# Patient Record
Sex: Male | Born: 1935 | Race: White | Hispanic: No | Marital: Married | Smoking: Never smoker
Health system: Southern US, Community
[De-identification: ages and names within clinical notes are randomized; demographics above are authoritative.]

## PROBLEM LIST (undated history)

## (undated) DIAGNOSIS — I2510 Atherosclerotic heart disease of native coronary artery without angina pectoris: Secondary | ICD-10-CM

## (undated) DIAGNOSIS — I10 Essential (primary) hypertension: Secondary | ICD-10-CM

## (undated) DIAGNOSIS — D649 Anemia, unspecified: Secondary | ICD-10-CM

## (undated) DIAGNOSIS — I5022 Chronic systolic (congestive) heart failure: Secondary | ICD-10-CM

## (undated) DIAGNOSIS — N184 Chronic kidney disease, stage 4 (severe): Secondary | ICD-10-CM

## (undated) DIAGNOSIS — M1990 Unspecified osteoarthritis, unspecified site: Secondary | ICD-10-CM

## (undated) DIAGNOSIS — G4730 Sleep apnea, unspecified: Secondary | ICD-10-CM

## (undated) DIAGNOSIS — J984 Other disorders of lung: Secondary | ICD-10-CM

## (undated) DIAGNOSIS — I4821 Permanent atrial fibrillation: Secondary | ICD-10-CM

## (undated) DIAGNOSIS — I350 Nonrheumatic aortic (valve) stenosis: Secondary | ICD-10-CM

## (undated) DIAGNOSIS — E785 Hyperlipidemia, unspecified: Secondary | ICD-10-CM

## (undated) DIAGNOSIS — J90 Pleural effusion, not elsewhere classified: Secondary | ICD-10-CM

## (undated) DIAGNOSIS — Z9289 Personal history of other medical treatment: Secondary | ICD-10-CM

## (undated) DIAGNOSIS — I340 Nonrheumatic mitral (valve) insufficiency: Secondary | ICD-10-CM

## (undated) DIAGNOSIS — R040 Epistaxis: Secondary | ICD-10-CM

## (undated) HISTORY — DX: Personal history of other medical treatment: Z92.89

## (undated) HISTORY — DX: Sleep apnea, unspecified: G47.30

## (undated) HISTORY — DX: Chronic systolic (congestive) heart failure: I50.22

## (undated) HISTORY — DX: Essential (primary) hypertension: I10

## (undated) HISTORY — DX: Permanent atrial fibrillation: I48.21

## (undated) HISTORY — PX: OTHER SURGICAL HISTORY: SHX169

## (undated) HISTORY — PX: CATARACT EXTRACTION: SUR2

## (undated) HISTORY — DX: Anemia, unspecified: D64.9

## (undated) HISTORY — PX: BACK SURGERY: SHX140

## (undated) HISTORY — DX: Pleural effusion, not elsewhere classified: J90

## (undated) HISTORY — DX: Other disorders of lung: J98.4

## (undated) HISTORY — DX: Hyperlipidemia, unspecified: E78.5

## (undated) HISTORY — PX: HERNIA REPAIR: SHX51

## (undated) HISTORY — DX: Atherosclerotic heart disease of native coronary artery without angina pectoris: I25.10

## (undated) HISTORY — DX: Nonrheumatic aortic (valve) stenosis: I35.0

## (undated) HISTORY — DX: Chronic kidney disease, stage 4 (severe): N18.4

---

## 1997-05-22 HISTORY — PX: CORONARY ARTERY BYPASS GRAFT: SHX141

## 1998-02-22 ENCOUNTER — Inpatient Hospital Stay (HOSPITAL_COMMUNITY): Admission: AD | Admit: 1998-02-22 | Discharge: 1998-03-01 | Payer: Self-pay | Admitting: Cardiovascular Disease

## 1998-02-22 ENCOUNTER — Encounter: Payer: Self-pay | Admitting: Cardiovascular Disease

## 1998-02-25 ENCOUNTER — Encounter: Payer: Self-pay | Admitting: Surgery

## 1998-02-26 ENCOUNTER — Encounter: Payer: Self-pay | Admitting: Surgery

## 2002-03-26 ENCOUNTER — Ambulatory Visit (HOSPITAL_COMMUNITY): Admission: RE | Admit: 2002-03-26 | Discharge: 2002-03-26 | Payer: Self-pay | Admitting: *Deleted

## 2002-10-15 ENCOUNTER — Ambulatory Visit (HOSPITAL_COMMUNITY): Admission: RE | Admit: 2002-10-15 | Discharge: 2002-10-15 | Payer: Self-pay | Admitting: Gastroenterology

## 2002-10-15 ENCOUNTER — Encounter (INDEPENDENT_AMBULATORY_CARE_PROVIDER_SITE_OTHER): Payer: Self-pay

## 2004-04-04 ENCOUNTER — Encounter: Payer: Self-pay | Admitting: Cardiovascular Disease

## 2006-09-07 ENCOUNTER — Ambulatory Visit (HOSPITAL_COMMUNITY): Admission: RE | Admit: 2006-09-07 | Discharge: 2006-09-07 | Payer: Self-pay | Admitting: Family Medicine

## 2007-04-04 ENCOUNTER — Encounter: Payer: Self-pay | Admitting: Cardiovascular Disease

## 2007-05-14 ENCOUNTER — Ambulatory Visit (HOSPITAL_COMMUNITY): Admission: RE | Admit: 2007-05-14 | Discharge: 2007-05-14 | Payer: Self-pay | Admitting: Cardiovascular Disease

## 2007-08-14 ENCOUNTER — Ambulatory Visit (HOSPITAL_COMMUNITY): Admission: RE | Admit: 2007-08-14 | Discharge: 2007-08-14 | Payer: Self-pay | Admitting: Cardiovascular Disease

## 2008-03-04 ENCOUNTER — Encounter: Admission: RE | Admit: 2008-03-04 | Discharge: 2008-03-04 | Payer: Self-pay | Admitting: Family Medicine

## 2008-05-22 HISTORY — PX: CORONARY ANGIOPLASTY WITH STENT PLACEMENT: SHX49

## 2009-01-20 ENCOUNTER — Ambulatory Visit (HOSPITAL_COMMUNITY): Admission: RE | Admit: 2009-01-20 | Discharge: 2009-01-20 | Payer: Self-pay | Admitting: Cardiovascular Disease

## 2009-01-20 ENCOUNTER — Encounter: Payer: Self-pay | Admitting: Pulmonary Disease

## 2009-01-20 DIAGNOSIS — Z9289 Personal history of other medical treatment: Secondary | ICD-10-CM

## 2009-01-20 HISTORY — DX: Personal history of other medical treatment: Z92.89

## 2009-02-03 ENCOUNTER — Inpatient Hospital Stay (HOSPITAL_COMMUNITY): Admission: EM | Admit: 2009-02-03 | Discharge: 2009-02-05 | Payer: Self-pay | Admitting: Emergency Medicine

## 2009-02-03 ENCOUNTER — Encounter: Payer: Self-pay | Admitting: Pulmonary Disease

## 2009-02-08 DIAGNOSIS — Z8679 Personal history of other diseases of the circulatory system: Secondary | ICD-10-CM

## 2009-02-08 DIAGNOSIS — G4733 Obstructive sleep apnea (adult) (pediatric): Secondary | ICD-10-CM

## 2009-02-08 DIAGNOSIS — J984 Other disorders of lung: Secondary | ICD-10-CM

## 2009-02-08 DIAGNOSIS — E119 Type 2 diabetes mellitus without complications: Secondary | ICD-10-CM

## 2009-02-08 DIAGNOSIS — N259 Disorder resulting from impaired renal tubular function, unspecified: Secondary | ICD-10-CM | POA: Insufficient documentation

## 2009-02-08 DIAGNOSIS — I10 Essential (primary) hypertension: Secondary | ICD-10-CM | POA: Insufficient documentation

## 2009-02-08 DIAGNOSIS — I2510 Atherosclerotic heart disease of native coronary artery without angina pectoris: Secondary | ICD-10-CM | POA: Insufficient documentation

## 2009-02-08 DIAGNOSIS — I5023 Acute on chronic systolic (congestive) heart failure: Secondary | ICD-10-CM

## 2009-02-08 DIAGNOSIS — E785 Hyperlipidemia, unspecified: Secondary | ICD-10-CM

## 2009-02-09 ENCOUNTER — Ambulatory Visit: Payer: Self-pay | Admitting: Pulmonary Disease

## 2009-02-09 DIAGNOSIS — R0609 Other forms of dyspnea: Secondary | ICD-10-CM | POA: Insufficient documentation

## 2009-03-01 ENCOUNTER — Ambulatory Visit (HOSPITAL_BASED_OUTPATIENT_CLINIC_OR_DEPARTMENT_OTHER): Admission: RE | Admit: 2009-03-01 | Discharge: 2009-03-01 | Payer: Self-pay | Admitting: Pulmonary Disease

## 2009-03-01 ENCOUNTER — Encounter: Payer: Self-pay | Admitting: Pulmonary Disease

## 2009-03-04 ENCOUNTER — Ambulatory Visit: Payer: Self-pay | Admitting: Pulmonary Disease

## 2009-03-04 ENCOUNTER — Encounter: Payer: Self-pay | Admitting: Pulmonary Disease

## 2009-03-16 ENCOUNTER — Ambulatory Visit: Payer: Self-pay | Admitting: Pulmonary Disease

## 2009-03-17 ENCOUNTER — Telehealth (INDEPENDENT_AMBULATORY_CARE_PROVIDER_SITE_OTHER): Payer: Self-pay | Admitting: *Deleted

## 2009-03-22 ENCOUNTER — Ambulatory Visit: Payer: Self-pay | Admitting: Pulmonary Disease

## 2009-04-19 ENCOUNTER — Ambulatory Visit: Payer: Self-pay | Admitting: Pulmonary Disease

## 2009-05-18 ENCOUNTER — Encounter (INDEPENDENT_AMBULATORY_CARE_PROVIDER_SITE_OTHER): Payer: Self-pay | Admitting: Internal Medicine

## 2009-05-18 ENCOUNTER — Inpatient Hospital Stay (HOSPITAL_COMMUNITY): Admission: EM | Admit: 2009-05-18 | Discharge: 2009-05-21 | Payer: Self-pay | Admitting: Emergency Medicine

## 2009-05-29 ENCOUNTER — Encounter: Payer: Self-pay | Admitting: Pulmonary Disease

## 2009-06-10 ENCOUNTER — Encounter: Payer: Self-pay | Admitting: Pulmonary Disease

## 2009-08-04 ENCOUNTER — Encounter: Payer: Self-pay | Admitting: Pulmonary Disease

## 2009-10-11 ENCOUNTER — Ambulatory Visit: Payer: Self-pay | Admitting: Pulmonary Disease

## 2009-10-12 ENCOUNTER — Encounter: Payer: Self-pay | Admitting: Pulmonary Disease

## 2009-11-26 ENCOUNTER — Ambulatory Visit: Payer: Medicare Other | Admitting: Ophthalmology

## 2009-12-01 ENCOUNTER — Ambulatory Visit: Payer: Medicare Other | Admitting: Ophthalmology

## 2009-12-28 ENCOUNTER — Ambulatory Visit: Payer: Self-pay | Admitting: Cardiovascular Disease

## 2010-02-02 ENCOUNTER — Ambulatory Visit: Payer: Self-pay | Admitting: Cardiovascular Disease

## 2010-02-15 ENCOUNTER — Ambulatory Visit: Payer: Self-pay | Admitting: Cardiovascular Disease

## 2010-03-15 ENCOUNTER — Ambulatory Visit: Payer: Self-pay | Admitting: Cardiovascular Disease

## 2010-04-12 ENCOUNTER — Ambulatory Visit: Payer: Self-pay | Admitting: Cardiology

## 2010-05-04 ENCOUNTER — Encounter: Payer: Self-pay | Admitting: Pulmonary Disease

## 2010-05-04 ENCOUNTER — Ambulatory Visit: Payer: Self-pay | Admitting: Cardiovascular Disease

## 2010-06-02 ENCOUNTER — Telehealth: Payer: Self-pay | Admitting: Pulmonary Disease

## 2010-06-02 ENCOUNTER — Ambulatory Visit: Payer: Self-pay | Admitting: Cardiology

## 2010-06-16 ENCOUNTER — Ambulatory Visit
Admission: RE | Admit: 2010-06-16 | Discharge: 2010-06-16 | Payer: Self-pay | Source: Home / Self Care | Attending: Pulmonary Disease | Admitting: Pulmonary Disease

## 2010-06-23 NOTE — Assessment & Plan Note (Signed)
Summary: rov for osa   Copy to:  Dr. Elease Hashimoto Primary Provider/Referring Provider:  Windle Guard  CC:  Pt is here for a 6 month f/u appt.  Pt states he is wearing his cpap machine every night.  Approx 6 to 8 horus per night.  Pt states cpap mcahine is "noisy" and causes bed partner to leave the room. Marland Kitchen  History of Present Illness: the patient comes in today for followup of his known obstructive sleep apnea. He is wearing CPAP compliantly, and is having no issues with his mask fit or CPAP pressure. He does occasionally break a seal while sleeping, and this sometimes leads to a flapping noise which can awaken his wife. Overall though, he is pleased with his improved sleep and also daytime alertness. It turns out that he is still on his auto titration setting, and he has never been put on his optimal pressure of 14 cm.   Current Medications (verified): 1)  Amlodipine Besylate 5 Mg Tabs (Amlodipine Besylate) .Marland Kitchen.. 1 Once Daily 2)  Plavix 75 Mg Tabs (Clopidogrel Bisulfate) .Marland Kitchen.. 1 Once Daily 3)  Nitrostat 0.4 Mg Subl (Nitroglycerin) .... Sublingually As Needed For Chest Pain 4)  Klor-Con 10 10 Meq Cr-Tabs (Potassium Chloride) .Marland Kitchen.. 1 Once Daily 5)  Crestor 20 Mg Tabs (Rosuvastatin Calcium) .... 1/2 By Mouth Daily 6)  Tramadol Hcl 50 Mg  Tabs (Tramadol Hcl) .... Take By Mouth As Needed 7)  Aspirin 81 Mg Tbec (Aspirin) .Marland Kitchen.. 1 Once Daily 8)  Vitamin D 5000 Units .Marland Kitchen.. 1 Once Daily 9)  Coumadin 5 Mg Tabs (Warfarin Sodium) .... Take As Directed 10)  Doxazosin Mesylate 4 Mg Tabs (Doxazosin Mesylate) .Marland Kitchen.. 1 At Bedtime 11)  Furosemide 40 Mg Tabs (Furosemide) .... Take 2 Tabs By Mouth Daily 12)  Metoprolol Tartrate 50 Mg Tabs (Metoprolol Tartrate) .Marland Kitchen.. 1 Two Times A Day 13)  Fish Oil 1000 Mg Caps (Omega-3 Fatty Acids) .Marland Kitchen.. 1 By Mouth Daily 14)  Plaquenil 200 Mg Tabs (Hydroxychloroquine Sulfate) .... 2 Once Daily 15)  Pepcid Ac 10 Mg Tabs (Famotidine) .... As Needed  Allergies (verified): No Known Drug  Allergies  Review of Systems  The patient denies shortness of breath with activity, shortness of breath at rest, productive cough, non-productive cough, coughing up blood, chest pain, irregular heartbeats, acid heartburn, indigestion, loss of appetite, weight change, abdominal pain, difficulty swallowing, sore throat, tooth/dental problems, headaches, nasal congestion/difficulty breathing through nose, sneezing, itching, ear ache, anxiety, depression, hand/feet swelling, joint stiffness or pain, rash, change in color of mucus, and fever.    Vital Signs:  Patient profile:   77 year old male Height:      70 inches Weight:      232.38 pounds BMI:     33.46 O2 Sat:      98 % on Room air Temp:     98.1 degrees F oral Pulse rate:   56 / minute BP sitting:   122 / 64  (left arm) Cuff size:   regular  Vitals Entered By: Arman Filter LPN (Oct 11, 2009 1:38 PM)  O2 Flow:  Room air  Physical Exam  General:  ow male in nad Nose:  no skin breakdown or pressure necrosis from cpap mask Extremities:  no edema or cyanosis Neurologic:  alert, not sleepy, moves all 4.   Impression & Recommendations:  Problem # 1:  OBSTRUCTIVE SLEEP APNEA (ICD-327.23) the pt is doing very well with cpap, and feels it has greatly improved his sleep and daytime  alertness. He occasionally has a mask leak that can create noise, but this does not happen very often. If it continues to be an issue, we can work on a different fitting mask. At this point, I would like to set his machine on his optimal pressure of 14 cm, and see how things go. I have asked him to call if he continues to have issues, and that he needs to work aggressively on weight loss.  Medications Added to Medication List This Visit: 1)  Tramadol Hcl 50 Mg Tabs (Tramadol hcl) .... Take by mouth as needed  Other Orders: Est. Patient Level III (04540) DME Referral (DME)  Patient Instructions: 1)  will get cpap machine set on 14cm where it is supposed  to be.  Let me know if you like auto mode better. 2)  work on weight loss. 3)  followup with me in one year or sooner if needed.   Immunization History:  Influenza Immunization History:    Influenza:  historical (05/22/2008)  Pneumovax Immunization History:    Pneumovax:  historical (05/23/2007)

## 2010-06-23 NOTE — Letter (Signed)
Summary: [facility]   Imported By: Maryln Gottron 08/16/2009 12:20:28  _____________________________________________________________________  External Attachment:    Type:   Image     Comment:   External Document

## 2010-06-23 NOTE — Progress Notes (Signed)
Summary: cpap re-cert  Phone Note Call from Patient Call back at Home Phone [phone]   Caller: Patient Call For: [facility] Summary of Call: pt says [facility] requests re-cert for cpap. (per medicare) Initial call taken by: Tivis Ringer, CNA,  June 02, 2010 10:16 AM  Follow-up for Phone Call        I spoke to [place] at [place] and she states per medicare guidelines pt has to have a f/u after receiving cpap within 30-90days of getting the machine.  Pt has to have an appt before Feb 2nd. Neysa Bonito states they have a download that they are faxin gover today for [facility] to look-at for this pt.  Pt was given cpap on 03-23-10. Pt set to see [facility] on 06-16-10 at 10:15. Carron Curie CMA  June 02, 2010 11:48 AM

## 2010-06-23 NOTE — Letter (Signed)
Summary: [facility] for CPAP Supplies/[facility]CS   [facility]N for CPAP Supplies/[facility]   Imported By: Sherian Rein 06/16/2009 15:11:04  _____________________________________________________________________  External Attachment:    Type:   Image     Comment:   External Document

## 2010-06-23 NOTE — Letter (Signed)
Summary: CMN for CPAP Supplies/[facility]  CMN for CPAP Supplies/[facility]   Imported By: Sherian Rein 10/22/2009 07:59:44  _____________________________________________________________________  External Attachment:    Type:   Image     Comment:   External Document

## 2010-06-23 NOTE — Letter (Signed)
Summary: [facility]N for CPAP Supplies/[facility]CS  [facility] for CPAP Supplies/[facility]   Imported By: Sherian Rein 06/17/2009 07:49:16  _____________________________________________________________________  External Attachment:    Type:   Image     Comment:   External Document

## 2010-06-23 NOTE — Assessment & Plan Note (Signed)
Summary: rov for osa   Copy to:  Dr. Elease Hashimoto Primary Provider/Referring Provider:  Windle Guard  CC:  FOLLOW UP. pt states he currently wears his cpap eveyrnight x 8 hrs a night. Pt states he is having no problems with machine/mask.  History of Present Illness: the pt comes in today for f/u of his known osa.  He has been wearing cpap compliantly, and has seen a definite difference in his sleep and daytime alertness.  We have gotten him switched over to a set pressure mode at 14cm, and he is tolerating this.  His mask is fitting well, and no more "flapping".  He reports no significant daytime sleepiness.  Current Medications (verified): 1)  Amlodipine Besylate 5 Mg Tabs (Amlodipine Besylate) .Marland Kitchen.. 1 Once Daily 2)  Plavix 75 Mg Tabs (Clopidogrel Bisulfate) .Marland Kitchen.. 1 Once Daily 3)  Nitrostat 0.4 Mg Subl (Nitroglycerin) .... Sublingually As Needed For Chest Pain 4)  Klor-Con 10 10 Meq Cr-Tabs (Potassium Chloride) .Marland Kitchen.. 1 Once Daily 5)  Crestor 20 Mg Tabs (Rosuvastatin Calcium) .... 1/2 By Mouth Daily 6)  Tramadol Hcl 50 Mg  Tabs (Tramadol Hcl) .... Take By Mouth As Needed 7)  Aspirin 81 Mg Tbec (Aspirin) .Marland Kitchen.. 1 Once Daily 8)  Vitamin D 5000 Units .Marland Kitchen.. 1 Once Daily 9)  Coumadin 5 Mg Tabs (Warfarin Sodium) .... Take As Directed 10)  Doxazosin Mesylate 4 Mg Tabs (Doxazosin Mesylate) .Marland Kitchen.. 1 At Bedtime 11)  Furosemide 40 Mg Tabs (Furosemide) .... Take 2 Tabs By Mouth Daily 12)  Metoprolol Tartrate 50 Mg Tabs (Metoprolol Tartrate) .Marland Kitchen.. 1 Two Times A Day 13)  Fish Oil 1000 Mg Caps (Omega-3 Fatty Acids) .Marland Kitchen.. 1 By Mouth Daily 14)  Plaquenil 200 Mg Tabs (Hydroxychloroquine Sulfate) .... 2 Once Daily 15)  Pepcid Ac 10 Mg Tabs (Famotidine) .... As Needed 16)  Iron 240 (27 Fe) Mg Tabs (Ferrous Gluconate) .... Once Daily  Allergies (verified): No Known Drug Allergies  Review of Systems  The patient denies shortness of breath with activity, shortness of breath at rest, productive cough, non-productive  cough, coughing up blood, chest pain, irregular heartbeats, acid heartburn, indigestion, loss of appetite, weight change, abdominal pain, difficulty swallowing, sore throat, tooth/dental problems, headaches, nasal congestion/difficulty breathing through nose, sneezing, itching, ear ache, anxiety, depression, hand/feet swelling, joint stiffness or pain, rash, change in color of mucus, and fever.    Vital Signs:  Patient profile:   77 year old male Height:      70 inches Weight:      245 pounds BMI:     35.28 O2 Sat:      98 % on Room air Temp:     98.3 degrees F oral Pulse rate:   64 / minute BP sitting:   138 / 80  (left arm) Cuff size:   large  Vitals Entered By: Carver Fila (June 16, 2010 3:27 PM)  O2 Flow:  Room air CC: FOLLOW UP. pt states he currently wears his cpap eveyrnight x 8 hrs a night. Pt states he is having no problems with machine/mask Comments meds and allergies updated Phone number updated Carver Fila  June 16, 2010 3:28 PM    Physical Exam  General:  obese male in nad Nose:  no skin breakdown or pressure necrosis from cpap mask Extremities:  mild edema LE, no cyanosis  Neurologic:  alert and oriented, moves all 4. does not appear sleepy.   Impression & Recommendations:  Problem # 1:  OBSTRUCTIVE SLEEP APNEA (ICD-327.23) the  pt is doing well with cpap, and feels he is sleeping well with satisfactory alertness during the day.  He is having no issues with mask fit or pressure.  I have asked him to continue with his cpap, and have encouraged him to work on weight loss.    Medications Added to Medication List This Visit: 1)  Iron 240 (27 Fe) Mg Tabs (Ferrous gluconate) .... Once daily  Other Orders: Est. Patient Level III ([address])  Patient Instructions: 1)  stay on cpap at current setting 2)  work on weight loss 3)  cancel upcoming may apptm and reschedule for one year from today.  I am happy to  see you sooner if having issues.   Immunization  History:  Influenza Immunization History:    Influenza:  historical (02/19/2010)  Pneumovax Immunization History:    Pneumovax:  historical (10/05/2008)

## 2010-06-23 NOTE — Miscellaneous (Signed)
Summary: optimal pressure 14cm  Clinical Lists Changes  Orders: Added new Referral order of DME Referral (DME) - Signed auto shows great compliance, optimal pressure 14cm

## 2010-06-29 ENCOUNTER — Encounter (INDEPENDENT_AMBULATORY_CARE_PROVIDER_SITE_OTHER): Payer: Medicare Other

## 2010-06-29 DIAGNOSIS — I4891 Unspecified atrial fibrillation: Secondary | ICD-10-CM

## 2010-06-29 DIAGNOSIS — Z7901 Long term (current) use of anticoagulants: Secondary | ICD-10-CM

## 2010-06-29 NOTE — Letter (Signed)
Summary: Alvia Grove MD/[facility]  Alvia Grove MD/[facility]   Imported By: Lester [place] 06/23/2010 09:55:17  _____________________________________________________________________  External Attachment:    Type:   Image     Comment:   External Document

## 2010-07-27 ENCOUNTER — Other Ambulatory Visit (INDEPENDENT_AMBULATORY_CARE_PROVIDER_SITE_OTHER): Payer: Medicare Other

## 2010-07-27 DIAGNOSIS — Z7901 Long term (current) use of anticoagulants: Secondary | ICD-10-CM

## 2010-07-27 DIAGNOSIS — I4891 Unspecified atrial fibrillation: Secondary | ICD-10-CM

## 2010-08-22 LAB — CBC
HCT: 27.7 % — ABNORMAL LOW (ref 39.0–52.0)
HCT: 28.1 % — ABNORMAL LOW (ref 39.0–52.0)
HCT: 29.9 % — ABNORMAL LOW (ref 39.0–52.0)
HCT: 31.8 % — ABNORMAL LOW (ref 39.0–52.0)
Hemoglobin: 10 g/dL — ABNORMAL LOW (ref 13.0–17.0)
Hemoglobin: 10.8 g/dL — ABNORMAL LOW (ref 13.0–17.0)
Hemoglobin: 9.2 g/dL — ABNORMAL LOW (ref 13.0–17.0)
MCHC: 33.5 g/dL (ref 30.0–36.0)
MCV: 81.8 fL (ref 78.0–100.0)
MCV: 82.4 fL (ref 78.0–100.0)
MCV: 82.4 fL (ref 78.0–100.0)
Platelets: 115 10*3/uL — ABNORMAL LOW (ref 150–400)
Platelets: 143 10*3/uL — ABNORMAL LOW (ref 150–400)
RBC: 3.11 MIL/uL — ABNORMAL LOW (ref 4.22–5.81)
RBC: 3.88 MIL/uL — ABNORMAL LOW (ref 4.22–5.81)
RDW: 15.4 % (ref 11.5–15.5)
RDW: 15.7 % — ABNORMAL HIGH (ref 11.5–15.5)
RDW: 16.1 % — ABNORMAL HIGH (ref 11.5–15.5)
WBC: 17.9 10*3/uL — ABNORMAL HIGH (ref 4.0–10.5)
WBC: 21 10*3/uL — ABNORMAL HIGH (ref 4.0–10.5)
WBC: 6.6 10*3/uL (ref 4.0–10.5)
WBC: 7.4 10*3/uL (ref 4.0–10.5)

## 2010-08-22 LAB — BASIC METABOLIC PANEL
BUN: 26 mg/dL — ABNORMAL HIGH (ref 6–23)
BUN: 28 mg/dL — ABNORMAL HIGH (ref 6–23)
BUN: 33 mg/dL — ABNORMAL HIGH (ref 6–23)
CO2: 28 mEq/L (ref 19–32)
Chloride: 103 mEq/L (ref 96–112)
Chloride: 103 mEq/L (ref 96–112)
Chloride: 103 mEq/L (ref 96–112)
Chloride: 105 mEq/L (ref 96–112)
Creatinine, Ser: 2.25 mg/dL — ABNORMAL HIGH (ref 0.4–1.5)
Creatinine, Ser: 2.35 mg/dL — ABNORMAL HIGH (ref 0.4–1.5)
GFR calc Af Amer: 34 mL/min — ABNORMAL LOW (ref >60)
GFR calc non Af Amer: 30 mL/min — ABNORMAL LOW (ref >60)
Glucose, Bld: 96 mg/dL (ref 70–99)
Glucose, Bld: 98 mg/dL (ref 70–99)
Potassium: 3.3 mEq/L — ABNORMAL LOW (ref 3.5–5.1)
Potassium: 3.3 mEq/L — ABNORMAL LOW (ref 3.5–5.1)
Potassium: 3.4 mEq/L — ABNORMAL LOW (ref 3.5–5.1)
Potassium: 3.6 mEq/L (ref 3.5–5.1)
Potassium: 3.9 mEq/L (ref 3.5–5.1)
Sodium: 139 mEq/L (ref 135–145)
Sodium: 141 mEq/L (ref 135–145)
Sodium: 142 mEq/L (ref 135–145)

## 2010-08-22 LAB — CULTURE, BLOOD (ROUTINE X 2)

## 2010-08-22 LAB — POCT CARDIAC MARKERS
Myoglobin, poc: 365 ng/mL (ref 12–200)
Troponin i, poc: 0.18 ng/mL — ABNORMAL HIGH (ref 0.00–0.09)
Troponin i, poc: <0.05 ng/mL (ref 0.00–0.09)

## 2010-08-22 LAB — CARDIAC PANEL(CRET KIN+CKTOT+MB+TROPI)
Relative Index: 3.2 — ABNORMAL HIGH (ref 0.0–2.5)
Relative Index: 5.7 — ABNORMAL HIGH (ref 0.0–2.5)
Total CK: 132 U/L (ref 7–232)

## 2010-08-22 LAB — LIPID PANEL
HDL: 43 mg/dL (ref >39)
Total CHOL/HDL Ratio: 2.2 RATIO

## 2010-08-22 LAB — DIFFERENTIAL
Basophils Relative: 0 % (ref 0–1)
Eosinophils Relative: 0 % (ref 0–5)
Lymphs Abs: 0.2 10*3/uL — ABNORMAL LOW (ref 0.7–4.0)
Monocytes Relative: 4 % (ref 3–12)
Neutro Abs: 17 10*3/uL — ABNORMAL HIGH (ref 1.7–7.7)

## 2010-08-22 LAB — BRAIN NATRIURETIC PEPTIDE
Pro B Natriuretic peptide (BNP): 1848 pg/mL — ABNORMAL HIGH (ref 0.0–100.0)
Pro B Natriuretic peptide (BNP): 810 pg/mL — ABNORMAL HIGH (ref 0.0–100.0)

## 2010-08-22 LAB — GLUCOSE, CAPILLARY

## 2010-08-22 LAB — URINALYSIS, ROUTINE W REFLEX MICROSCOPIC
Bilirubin Urine: NEGATIVE
Ketones, ur: NEGATIVE mg/dL
Nitrite: NEGATIVE
pH: 7 (ref 5.0–8.0)

## 2010-08-22 LAB — APTT: aPTT: 32 seconds (ref 24–37)

## 2010-08-22 LAB — PROTIME-INR
INR: 1.57 — ABNORMAL HIGH (ref 0.00–1.49)
Prothrombin Time: 18.8 seconds — ABNORMAL HIGH (ref 11.6–15.2)

## 2010-08-22 LAB — TROPONIN I

## 2010-08-24 ENCOUNTER — Ambulatory Visit (INDEPENDENT_AMBULATORY_CARE_PROVIDER_SITE_OTHER): Payer: Medicare Other | Admitting: *Deleted

## 2010-08-24 DIAGNOSIS — Z7901 Long term (current) use of anticoagulants: Secondary | ICD-10-CM

## 2010-08-24 DIAGNOSIS — I4891 Unspecified atrial fibrillation: Secondary | ICD-10-CM

## 2010-08-24 LAB — POCT INR: INR: 2.9

## 2010-08-25 ENCOUNTER — Encounter: Payer: Medicare Other | Admitting: *Deleted

## 2010-08-26 LAB — CARDIAC PANEL(CRET KIN+CKTOT+MB+TROPI)
CK, MB: 49.8 ng/mL — ABNORMAL HIGH (ref 0.3–4.0)
Relative Index: 12.6 — ABNORMAL HIGH (ref 0.0–2.5)
Total CK: 396 U/L — ABNORMAL HIGH (ref 7–232)
Troponin I: 10.6 ng/mL (ref 0.00–0.06)
Troponin I: 18.69 ng/mL (ref 0.00–0.06)

## 2010-08-26 LAB — POCT I-STAT, CHEM 8
Calcium, Ion: 1.1 mmol/L — ABNORMAL LOW (ref 1.12–1.32)
Glucose, Bld: 113 mg/dL — ABNORMAL HIGH (ref 70–99)
HCT: 36 % — ABNORMAL LOW (ref 39.0–52.0)
Hemoglobin: 12.2 g/dL — ABNORMAL LOW (ref 13.0–17.0)

## 2010-08-26 LAB — POCT I-STAT 3, ART BLOOD GAS (G3+)
TCO2: 30 mmol/L (ref 0–100)
pCO2 arterial: 41.7 mmHg (ref 35.0–45.0)
pH, Arterial: 7.444 (ref 7.350–7.450)

## 2010-08-26 LAB — COMPREHENSIVE METABOLIC PANEL
Alkaline Phosphatase: 54 U/L (ref 39–117)
BUN: 21 mg/dL (ref 6–23)
CO2: 28 mEq/L (ref 19–32)
GFR calc non Af Amer: 38 mL/min — ABNORMAL LOW (ref >60)
Glucose, Bld: 121 mg/dL — ABNORMAL HIGH (ref 70–99)
Potassium: 3.4 mEq/L — ABNORMAL LOW (ref 3.5–5.1)
Total Bilirubin: 0.7 mg/dL (ref 0.3–1.2)
Total Protein: 6.1 g/dL (ref 6.0–8.3)

## 2010-08-26 LAB — URINE CULTURE
Colony Count: NO GROWTH
Culture: NO GROWTH

## 2010-08-26 LAB — BASIC METABOLIC PANEL
CO2: 31 mEq/L (ref 19–32)
GFR calc Af Amer: 43 mL/min — ABNORMAL LOW (ref >60)
GFR calc Af Amer: 44 mL/min — ABNORMAL LOW (ref >60)
GFR calc non Af Amer: 36 mL/min — ABNORMAL LOW (ref >60)
GFR calc non Af Amer: 36 mL/min — ABNORMAL LOW (ref >60)
Glucose, Bld: 111 mg/dL — ABNORMAL HIGH (ref 70–99)
Potassium: 3.7 mEq/L (ref 3.5–5.1)
Potassium: 4.1 mEq/L (ref 3.5–5.1)
Sodium: 140 mEq/L (ref 135–145)
Sodium: 142 mEq/L (ref 135–145)

## 2010-08-26 LAB — URINALYSIS, ROUTINE W REFLEX MICROSCOPIC
Bilirubin Urine: NEGATIVE
Nitrite: NEGATIVE
Protein, ur: 100 mg/dL — AB
Specific Gravity, Urine: 1.01 (ref 1.005–1.030)
Urobilinogen, UA: 0.2 mg/dL (ref 0.0–1.0)

## 2010-08-26 LAB — PROTIME-INR
INR: 1.3 (ref 0.00–1.49)
INR: 1.2 (ref 0.00–1.49)
INR: 1.2 (ref 0.00–1.49)
Prothrombin Time: 15.2 seconds (ref 11.6–15.2)
Prothrombin Time: 15.5 seconds — ABNORMAL HIGH (ref 11.6–15.2)

## 2010-08-26 LAB — DIFFERENTIAL
Basophils Absolute: 0 10*3/uL (ref 0.0–0.1)
Basophils Relative: 0 % (ref 0–1)
Eosinophils Absolute: 0.3 10*3/uL (ref 0.0–0.7)
Monocytes Relative: 6 % (ref 3–12)
Neutro Abs: 6.7 10*3/uL (ref 1.7–7.7)
Neutrophils Relative %: 82 % — ABNORMAL HIGH (ref 43–77)

## 2010-08-26 LAB — CBC
HCT: 34.7 % — ABNORMAL LOW (ref 39.0–52.0)
Hemoglobin: 11.8 g/dL — ABNORMAL LOW (ref 13.0–17.0)
MCHC: 34.1 g/dL (ref 30.0–36.0)
Platelets: 172 10*3/uL (ref 150–400)
RBC: 4.01 MIL/uL — ABNORMAL LOW (ref 4.22–5.81)
RDW: 13.6 % (ref 11.5–15.5)
RDW: 14 % (ref 11.5–15.5)

## 2010-08-26 LAB — TROPONIN I: Troponin I: 2.49 ng/mL (ref 0.00–0.06)

## 2010-08-26 LAB — POCT CARDIAC MARKERS: Troponin i, poc: 1.15 ng/mL (ref 0.00–0.09)

## 2010-08-26 LAB — CK TOTAL AND CKMB (NOT AT ARMC): Relative Index: 12.5 — ABNORMAL HIGH (ref 0.0–2.5)

## 2010-08-26 LAB — URINE MICROSCOPIC-ADD ON

## 2010-09-01 ENCOUNTER — Encounter: Payer: Self-pay | Admitting: Cardiovascular Disease

## 2010-09-05 ENCOUNTER — Other Ambulatory Visit: Payer: Self-pay | Admitting: *Deleted

## 2010-09-05 DIAGNOSIS — I2510 Atherosclerotic heart disease of native coronary artery without angina pectoris: Secondary | ICD-10-CM

## 2010-09-05 MED ORDER — METOPROLOL TARTRATE 50 MG PO TABS: 50.0000 mg | ORAL_TABLET | Freq: Two times a day (BID) | ORAL | Status: DC

## 2010-09-21 ENCOUNTER — Ambulatory Visit (INDEPENDENT_AMBULATORY_CARE_PROVIDER_SITE_OTHER): Payer: Medicare Other | Admitting: *Deleted

## 2010-09-21 DIAGNOSIS — I4891 Unspecified atrial fibrillation: Secondary | ICD-10-CM

## 2010-10-04 NOTE — H&P (Signed)
Alex Orozco NO.:  [id number]   MEDICAL RECORD NO.:  [id number]           PATIENT TYPE:  OUT   LOCATION:  CARD                         FACILITY:  [facility]   PHYSICIAN:  Vesta Mixer, M.D. DATE OF BIRTH:  December 06, 1929   DATE OF ADMISSION:  01/15/2009  DATE OF DISCHARGE:                              HISTORY & PHYSICAL   Alex Orozco is an elderly gentleman with a history of coronary artery  disease.  He is status post coronary artery bypass grafting.  He also  has intermittent atrial fibrillation.  He has moderate aortic stenosis.  He also has hyperlipidemia and hypertension.  He presents today with  generalized fatigue and shortness of breath.   Alex Orozco was seen earlier this month.  He has had progressive  shortness of breath that was presumed to be due to diastolic  dysfunction.  He was started on Ranexa.  He actually only started the  Ranexa 3 days ago and it has not helped much.  He denies any syncope or  presyncope.  He denies any PND or orthopnea.   He is worried that he is so short of breath that he just cannot talk at  times when he gets so fatigued.  He has a raspiness in his voice that  actually sounds like a pulmonary issue more than a cardiac issue.   CURRENT MEDICATIONS:  1. Potassium chloride 10 mEq a day.  2. Metoprolol 50 mg twice a day.  3. Doxazosin 2 mg a day.  4. Aspirin 81 mg a day.  5. Hydrochlorothiazide 50 mg a day.  6. Coumadin 5 mg alternating with 2.5 mg a day.  7. Omega-3 fatty acids once a day.  8. Simvastatin 40 mg a day.  9. Plaquenil 200 mg twice a day.  10.Vitamin D 5000 international units a day.  11.Ranexa 500 mg twice a day.   ALLERGIES:  None.   PAST MEDICAL HISTORY:  1. He is status post coronary artery bypass grafting.  2. History of hypertension.  3. Hyperlipidemia.  4. Diastolic dysfunction.  5. Intermittent atrial fibrillation - on chronic Coumadin therapy.  6. Aortic stenosis.  He had an estimated  aortic valve area of 1.2 cm      squared by echo in December 2008.  His mean gradient was 15 mmHg.      He has mild mitral regurgitation and trace tricuspid regurgitation.   SOCIAL HISTORY:  The patient is a nonsmoker.  He is retired.   His family history is positive for cardiac disease.   His review of systems is reviewed in the HPI.  He has had some severe  fatigue.  He denies any change of his vision.  He denies any hearing  loss.  He does have a hoarse voice occasionally especially when he gets  tired.  He denies any real palpitations.  He has had a dry cough.  He  denies any nausea or vomiting.  He denies any urinary incontinence.  He  denies any pain in his breast.  He denies any skin changes.  He  does  have some back pain and difficulty walking.  He denies any anxiety or  depression.  He denies any anemia.  He denies shingles or thrush.   PHYSICAL EXAMINATION:  GENERAL:  He is an elderly gentleman in no acute  distress.  He is alert and oriented x3 and his mood and affect are  normal.  VITAL SIGNS:  His weight is 229.  His blood pressure is 172/100 with  heart rate of 71.  HEENT:  2+ carotids.  He has no bruits, no thyromegaly, no JVD.  His  sclerae are nonicteric.  His mucous membranes are moist.  NECK:  Supple.  LUNGS:  Clear.  HEART:  Regular rate, S1 and S2.  He has a soft systolic murmur.  His  PMI is nondisplaced.  ABDOMEN:  Good bowel sounds.  There is no hepatosplenomegaly.  There is  no guarding or rebound.  There are no bruits.  EXTREMITIES:  He has had no clubbing or cyanosis.  He does have trace  edema.  SKIN:  There are no skin changes.  There are no palpable cords.  His  pulses are 1 to 2+.  NEURO:  Cranial nerves II-XII are intact and his motor and sensory  function are intact.  Gait is normal.   His EKG reveals normal sinus rhythm.  He has nonspecific ST and T wave  changes.   His laboratory data is pending.   Alex Orozco presents with hypertension as  well as worsening shortness of  breath.  I would like to increase his doxazosin to 4 mg a day for his  high blood pressure.  I would like to do a heart catheterization  including the right and left heart catheterization later this week.  We  will schedule that out several days to allow his protime to come down.  It is quite possible that he has worsening aortic stenosis that is  causing this.  His last echocardiogram did not reveal critical aortic  stenosis, however.  All of his other medical problems are stable.      Vesta Mixer, M.D.  Electronically Signed     PJN/MEDQ  D:  01/12/2009  T:  01/12/2009  Job:  [id number]   cc:   Windle Guard, M.D.

## 2010-10-07 NOTE — Op Note (Signed)
NAME:  Alex Orozco, Alex Orozco                            ACCOUNT NO.:  [id number]   MEDICAL RECORD NO.:  [id number]                   PATIENT TYPE:  AMB   LOCATION:  ENDO                                 FACILITY:  [facility]   PHYSICIAN:  Danise Edge, M.D.                DATE OF BIRTH:  [birth year]   DATE OF PROCEDURE:  10/15/2002  DATE OF DISCHARGE:                                 OPERATIVE REPORT   PROCEDURES:  Esophagogastroduodenoscopy and colonoscopy with polypectomy.   REFERRING PHYSICIAN:  Windle Guard, M.D.   INDICATIONS:  The patient is a 77 year old male born 03-11-1930.  The  patient underwent his health maintenance physical examination performed by  Dr. Windle Guard.  He submitted stool Hemoccult cards for testing, and two  cards returned positive for blood.  The patient had a thrombosed external  hemorrhoids with bleeding during the time he collected the stool for  Hemoccult card testing.  He also has a mild painless nonbloody drainage from  his anorectum, but there is no solid stool incontinence.   MEDICATION ALLERGIES:  None.   CHRONIC MEDICATIONS:  Metoprolol, Zestril, Lasix, potassium, Lipitor,  Cardura, Pepcid, and aspirin.   PAST MEDICAL HISTORY:  1. Hypertension.  2. Hypertension.  3. Coronary artery bypass graft surgery.  4. Back surgery.  5. Herniorrhaphy.   FAMILY HISTORY:  Negative for colon cancer.   PROCEDURE:  Diagnostic esophagogastroduodenoscopy.   DESCRIPTION OF PROCEDURE:  After obtaining informed consent, the patient was  placed in the left lateral decubitus position.  I administered intravenous  Demerol and intravenous Versed to achieve conscious sedation for the  procedure.  The patient's blood pressure, oxygen saturation, and cardiac  rhythm were monitored throughout the procedure and documented in the medical  record.   The Olympus gastroscope was passed through the posterior hypopharynx into  the proximal esophagus without difficulty.   The hypopharynx, larynx, and  vocal cords appeared normal.   Esophagoscopy:  The proximal and mid segments of the esophagus appeared  normal.  There is mild nonobstructing mucosal scarring at the  esophagogastric junction unassociated with Barrett's esophagus or erosive  esophagitis.   Gastroscopy:  The patient has a moderate-sized hiatal hernia.  Retroflexed  view of the gastric cardia and fundus was normal.  The gastric body, antrum,  and pylorus appeared normal.   Duodenoscopy:  The duodenal bulb and mid-duodenum and distal duodenum appear  normal.   ASSESSMENT:  1. No signs of upper gastrointestinal bleeding or peptic ulcer disease.  2. Moderate-sized hiatal hernia associated with mucosal scarring at the     esophagogastric junction, probably secondary to gastroesophageal reflux     disease.   PROCEDURE:  Proctocolonoscopy.   DESCRIPTION OF PROCEDURE:  Anal inspection was normal.  Digital rectal  examination revealed a non-nodular prostate.  The Olympus adult colonoscope  was introduced into the rectum and advanced to the cecum.  Colonic  preparation for the exam today was excellent.   Rectum normal.   Sigmoid colon and descending colon:  From the distal sigmoid colon at  approximately 30 cm from the anal verge two 2 mm sessile polyps were removed  with the electrocautery snare and submitted for pathologic interpretation.   Splenic flexure normal.   Transverse colon normal.   Hepatic flexure normal.   Ascending colon normal.   Cecum and ileocecal valve normal.   ASSESSMENT:  Two small polyps were removed from the distal sigmoid colon and  submitted for pathologic interpretation.                                                 Danise Edge, M.D.    MJ/MEDQ  D:  10/15/2002  T:  10/15/2002  Job:  [id number]   cc:   Windle Guard, M.D.  [address]  [place], [place] [address]  Fax: [phone]

## 2010-10-11 ENCOUNTER — Ambulatory Visit: Payer: Self-pay | Admitting: Pulmonary Disease

## 2010-10-19 ENCOUNTER — Ambulatory Visit (INDEPENDENT_AMBULATORY_CARE_PROVIDER_SITE_OTHER): Payer: Medicare Other | Admitting: *Deleted

## 2010-10-19 DIAGNOSIS — I4891 Unspecified atrial fibrillation: Secondary | ICD-10-CM

## 2010-10-21 ENCOUNTER — Encounter: Payer: Self-pay | Admitting: Interventional Radiology

## 2010-10-26 ENCOUNTER — Encounter: Payer: Self-pay | Admitting: Cardiovascular Disease

## 2010-10-26 ENCOUNTER — Other Ambulatory Visit: Payer: Self-pay | Admitting: *Deleted

## 2010-10-26 DIAGNOSIS — E785 Hyperlipidemia, unspecified: Secondary | ICD-10-CM

## 2010-10-27 ENCOUNTER — Ambulatory Visit (INDEPENDENT_AMBULATORY_CARE_PROVIDER_SITE_OTHER): Payer: Medicare Other | Admitting: Cardiovascular Disease

## 2010-10-27 ENCOUNTER — Encounter: Payer: Self-pay | Admitting: Cardiovascular Disease

## 2010-10-27 ENCOUNTER — Ambulatory Visit (INDEPENDENT_AMBULATORY_CARE_PROVIDER_SITE_OTHER): Payer: Medicare Other | Admitting: *Deleted

## 2010-10-27 ENCOUNTER — Other Ambulatory Visit (INDEPENDENT_AMBULATORY_CARE_PROVIDER_SITE_OTHER): Payer: Medicare Other | Admitting: *Deleted

## 2010-10-27 VITALS — BP 182/82 | HR 56 | Ht 67.0 in | Wt 228.4 lb

## 2010-10-27 DIAGNOSIS — E785 Hyperlipidemia, unspecified: Secondary | ICD-10-CM

## 2010-10-27 DIAGNOSIS — I10 Essential (primary) hypertension: Secondary | ICD-10-CM

## 2010-10-27 DIAGNOSIS — I4891 Unspecified atrial fibrillation: Secondary | ICD-10-CM

## 2010-10-27 DIAGNOSIS — I2510 Atherosclerotic heart disease of native coronary artery without angina pectoris: Secondary | ICD-10-CM

## 2010-10-27 LAB — BASIC METABOLIC PANEL
CO2: 27 mEq/L (ref 19–32)
Chloride: 100 mEq/L (ref 96–112)
Glucose, Bld: 92 mg/dL (ref 70–99)
Potassium: 3.8 mEq/L (ref 3.5–5.1)
Sodium: 141 mEq/L (ref 135–145)

## 2010-10-27 LAB — HEPATIC FUNCTION PANEL
AST: 34 U/L (ref 0–37)
Albumin: 4.1 g/dL (ref 3.5–5.2)
Alkaline Phosphatase: 60 U/L (ref 39–117)
Bilirubin, Direct: 0.2 mg/dL (ref 0.0–0.3)
Total Protein: 6.9 g/dL (ref 6.0–8.3)

## 2010-10-27 LAB — LIPID PANEL: Total CHOL/HDL Ratio: 3

## 2010-10-27 LAB — POCT INR: INR: 2.1

## 2010-10-27 MED ORDER — WARFARIN SODIUM 5 MG PO TABS: 5.0000 mg | ORAL_TABLET | ORAL | Status: DC

## 2010-10-27 MED ORDER — FUROSEMIDE 40 MG PO TABS: 40.0000 mg | ORAL_TABLET | Freq: Every day | ORAL | Status: DC

## 2010-10-27 NOTE — Progress Notes (Signed)
Alex Orozco Date of Birth  1929-12-17 [facility] [address]     [address] [place], [place]  [address] [phone]  Fax  [phone]  History of Present Illness:  Alex Orozco is a day elderly gentleman with a history of coronary artery disease, hypertension, chronic renal insufficiency, and congestive heart failure. He is in today for followup evaluation. He has not had any problems. He still is a little bit of extra salt. He is not getting as much exercise because of some orthopedic problems. He had lots of fluid drained out of one of his knees recently. He has rheumatoid arthritis that limits his exercise ability  Current Outpatient Prescriptions on File Prior to Visit  Medication Sig Dispense Refill  . amLODipine (NORVASC) 5 MG tablet Take 2.5 mg by mouth daily.       Marland Kitchen aspirin 81 MG tablet Take 81 mg by mouth daily.        . Cholecalciferol (VITAMIN D-3) 5000 UNITS TABS Take by mouth daily.        . clopidogrel (PLAVIX) 75 MG tablet Take 75 mg by mouth daily.        Marland Kitchen doxazosin (CARDURA) 4 MG tablet Take 4 mg by mouth at bedtime.        . famotidine (PEPCID) 10 MG tablet Take 10 mg by mouth 2 (two) times daily as needed.        . fish oil-omega-3 fatty acids 1000 MG capsule Take by mouth daily.        . hydroxychloroquine (PLAQUENIL) 200 MG tablet Take by mouth daily. 2 TABLETS DAILY       . IRON PO Take by mouth daily.        . metoprolol (LOPRESSOR) 50 MG tablet Take 1 tablet (50 mg total) by mouth 2 (two) times daily.  60 tablet  11  . potassium chloride (K-DUR) 10 MEQ tablet Take 10 mEq by mouth daily.        . rosuvastatin (CRESTOR) 10 MG tablet Take 10 mg by mouth daily.        . traMADol (ULTRAM) 50 MG tablet Take 50 mg by mouth every 6 (six) hours as needed.        Marland Kitchen DISCONTD: furosemide (LASIX) 40 MG tablet Take 40 mg by mouth daily.        Marland Kitchen DISCONTD: warfarin (COUMADIN) 5 MG tablet Take 5 mg by mouth as directed.          No Known  Allergies  Past Medical History  Diagnosis Date  . Coronary artery disease   . Lung disease, restrictive   . History of PFTs 01/20/2009  . Pleural effusion   . Chronic renal insufficiency   . CHF (congestive heart failure)     EF 40-50%  . Hypertension   . Dyslipidemia   . Aortic stenosis     MODERATE  . Atrial fibrillation   . Anemia     Past Surgical History  Procedure Date  . Coronary artery bypass graft 1999  . Coronary angioplasty with stent placement     OF THE SAPHENOUS VEIN GRAFT TO THE THIRD DIAGONAL.  HE HAS MODERATE TO SEVERE STENOSIS REMAINING ON HIS NATIVE LEFT CIRCUMFLEX PROXIMAL VESSEL  . Back surgery     History  Smoking status  . Never Smoker   Smokeless tobacco  . Not on file    History  Alcohol Use No    Family History  Problem  Relation Age of Onset  . Heart failure Mother   . Heart attack Father   . Hypertension Father     Reviw of Systems:  Reviewed in the HPI.  All other systems are negative.  Physical Exam: BP 182/82  Pulse 56  Ht 5\' 7"  (1.702 m)  Wt 228 lb 6.4 oz (103.602 kg)  BMI 35.77 kg/m2 The patient is alert and oriented x 3.  The mood and affect are normal.  The skin is warm and dry.  Color is normal.  The HEENT exam reveals that the sclera are nonicteric.  The mucous membranes are moist.  The carotids are 2+ without bruits.  There is no thyromegaly.  There is no JVD.  The lungs are clear.  The chest wall is non tender.  The heart exam reveals a regular rate with a normal S1 and S2.  There are no murmurs, gallops, or rubs.  The PMI is not displaced.   Abdominal exam reveals good bowel sounds.  There is no guarding or rebound.  There is no hepatosplenomegaly or tenderness.  There are no masses.  Exam of the legs reveal no clubbing, cyanosis, or edema.  The legs are without rashes.  The distal pulses are intact.  Cranial nerves II - XII are intact.  Motor and sensory functions are intact.  The gait is normal.  Assessment / Plan:

## 2010-10-27 NOTE — Assessment & Plan Note (Signed)
His blood pressure remains elevated. His wife states that he still eats a fair amount of salt. We'll have him cut out salt intake and exercise as much is possible. I hesitate to increase his amlodipine because that caused such severe leg edema. One alternative would be to increase his Cardura up to 8 mg a day. His heart rate is already fairly slow so I do not think that we will be able to increase his Toprol.  I'll see him again in several months for followup evaluation. He's to call me with his blood pressure readings.

## 2010-10-28 NOTE — Progress Notes (Signed)
Called and mailed results.

## 2010-11-16 ENCOUNTER — Ambulatory Visit (INDEPENDENT_AMBULATORY_CARE_PROVIDER_SITE_OTHER): Payer: Medicare Other | Admitting: *Deleted

## 2010-11-16 DIAGNOSIS — I4891 Unspecified atrial fibrillation: Secondary | ICD-10-CM

## 2010-11-16 LAB — POCT INR: INR: 2.5

## 2010-12-14 ENCOUNTER — Ambulatory Visit (INDEPENDENT_AMBULATORY_CARE_PROVIDER_SITE_OTHER): Payer: Medicare Other | Admitting: *Deleted

## 2010-12-14 DIAGNOSIS — I4891 Unspecified atrial fibrillation: Secondary | ICD-10-CM

## 2010-12-14 LAB — POCT INR: INR: 2.2

## 2010-12-27 ENCOUNTER — Telehealth: Payer: Self-pay | Admitting: Cardiovascular Disease

## 2010-12-27 DIAGNOSIS — E876 Hypokalemia: Secondary | ICD-10-CM

## 2010-12-27 NOTE — Telephone Encounter (Signed)
Pt went ahead and schedule his 6 M OV on 05/03/11, pt was last seen 10/27/10, pt states usually only has bloodwork done once a year, please confirm if pt does need blood work and call pt back, if he does, we need to reschedule his 76 M OV and also schedule coinciding labs, his # [phone]

## 2010-12-27 NOTE — Telephone Encounter (Signed)
Patient was seen in June and had labs.  States he usually only gets labs once a year.  Future orders are in for patient and he wanted for me to double check with Dr Elease Hashimoto and make sure he needed.  Ok to wait.  Please advise

## 2010-12-28 NOTE — Telephone Encounter (Signed)
Pt will need lipids, HFP, BMP 1 year after the last labwork

## 2010-12-28 NOTE — Telephone Encounter (Signed)
Pt called and informed no labs on 19month but yearly

## 2011-01-10 ENCOUNTER — Ambulatory Visit (INDEPENDENT_AMBULATORY_CARE_PROVIDER_SITE_OTHER): Payer: Medicare Other | Admitting: *Deleted

## 2011-01-10 DIAGNOSIS — I4891 Unspecified atrial fibrillation: Secondary | ICD-10-CM

## 2011-02-02 ENCOUNTER — Other Ambulatory Visit: Payer: Self-pay | Admitting: *Deleted

## 2011-02-02 MED ORDER — DOXAZOSIN MESYLATE 4 MG PO TABS: 4.0000 mg | ORAL_TABLET | Freq: Every day | ORAL | Status: DC

## 2011-02-02 NOTE — Telephone Encounter (Signed)
Had been getting request and unable to find patient, goes by Alex Orozco.  Refilled

## 2011-02-07 ENCOUNTER — Ambulatory Visit (INDEPENDENT_AMBULATORY_CARE_PROVIDER_SITE_OTHER): Payer: Medicare Other | Admitting: *Deleted

## 2011-02-07 DIAGNOSIS — I4891 Unspecified atrial fibrillation: Secondary | ICD-10-CM

## 2011-02-07 LAB — POCT INR: INR: 2.2

## 2011-02-13 ENCOUNTER — Other Ambulatory Visit: Payer: Self-pay | Admitting: *Deleted

## 2011-02-13 ENCOUNTER — Other Ambulatory Visit: Payer: Self-pay | Admitting: Cardiovascular Disease

## 2011-02-13 MED ORDER — AMLODIPINE BESYLATE 5 MG PO TABS: 2.5000 mg | ORAL_TABLET | Freq: Every day | ORAL | Status: DC

## 2011-02-13 NOTE — Telephone Encounter (Signed)
Fax received from pharmacy. Refill completed. Jodette Thais Silberstein RN  

## 2011-02-13 NOTE — Telephone Encounter (Signed)
90 Days was wanted.

## 2011-02-20 ENCOUNTER — Other Ambulatory Visit: Payer: Self-pay | Admitting: Cardiovascular Disease

## 2011-03-07 ENCOUNTER — Ambulatory Visit (INDEPENDENT_AMBULATORY_CARE_PROVIDER_SITE_OTHER): Payer: Medicare Other | Admitting: *Deleted

## 2011-03-07 DIAGNOSIS — I4891 Unspecified atrial fibrillation: Secondary | ICD-10-CM

## 2011-03-07 LAB — POCT INR: INR: 2.4

## 2011-03-20 ENCOUNTER — Other Ambulatory Visit: Payer: Self-pay | Admitting: Cardiovascular Disease

## 2011-04-18 ENCOUNTER — Ambulatory Visit (INDEPENDENT_AMBULATORY_CARE_PROVIDER_SITE_OTHER): Payer: Medicare Other | Admitting: *Deleted

## 2011-04-18 DIAGNOSIS — I4891 Unspecified atrial fibrillation: Secondary | ICD-10-CM

## 2011-04-18 LAB — POCT INR: INR: 2.8

## 2011-04-26 ENCOUNTER — Encounter: Payer: Self-pay | Admitting: Cardiovascular Disease

## 2011-05-03 ENCOUNTER — Ambulatory Visit (INDEPENDENT_AMBULATORY_CARE_PROVIDER_SITE_OTHER): Payer: Medicare Other | Admitting: Cardiovascular Disease

## 2011-05-03 ENCOUNTER — Encounter: Payer: Self-pay | Admitting: Cardiovascular Disease

## 2011-05-03 DIAGNOSIS — I2510 Atherosclerotic heart disease of native coronary artery without angina pectoris: Secondary | ICD-10-CM

## 2011-05-03 DIAGNOSIS — I10 Essential (primary) hypertension: Secondary | ICD-10-CM

## 2011-05-03 DIAGNOSIS — I359 Nonrheumatic aortic valve disorder, unspecified: Secondary | ICD-10-CM

## 2011-05-03 NOTE — Assessment & Plan Note (Signed)
He brought some blood pressure recordings with him. Most of his readings are in the normal range. I've asked him to continue with a good diet and exercise program. He'll continue with the same medications.

## 2011-05-03 NOTE — Assessment & Plan Note (Signed)
Is doing well from a coronary standpoint. He's not having episodes of chest pain. We'll continue with the same medications.

## 2011-05-03 NOTE — Patient Instructions (Signed)
Your physician wants you to follow-up in: 6 months  You will receive a reminder letter in the mail two months in advance. If you don't receive a letter, please call our office to schedule the follow-up appointment.  Your physician recommends that you return for a FASTING lipid profile: 6 months   

## 2011-05-03 NOTE — Progress Notes (Signed)
Alex Orozco Date of Birth  Sep 18, 1929 [facility] [address]    [address] [place], [place]  [address] [phone]  Fax  [phone]  History of Present Illness:  Alex Orozco is a 77 year old gentleman with a history of coronary artery disease. He status post coronary artery bypass grafting as well as PTCA and stenting of the saphenous graft vein graft to the third diagonal.  He also has a history of restrictive lung disease. Has chronic renal insufficiency with baseline creatinine of around 1.7. He has mild congestive heart failure with an ejection fraction of 40-50%. He also has a history of hypertension, hyperlipidemia, moderate aortic stenosis, and atrial fibrillation. He also has a history of anemia.  He denies any chest pain or shortness breath. He is able to do most of his normal activities without any significant problems.  Current Outpatient Prescriptions on File Prior to Visit  Medication Sig Dispense Refill  . amLODipine (NORVASC) 2.5 MG tablet TAKE 1 TABLET BY MOUTH EVERY DAY  90 tablet  1  . aspirin 81 MG tablet Take 81 mg by mouth daily.        . Cholecalciferol (VITAMIN D-3) 5000 UNITS TABS Take by mouth daily.        . clopidogrel (PLAVIX) 75 MG tablet TAKE 1 TABLET BY MOUTH EVERY DAY  90 tablet  3  . CRESTOR 10 MG tablet TAKE 1 TABELT BY MOUTH EVERY DAY  90 tablet  3  . doxazosin (CARDURA) 4 MG tablet Take 1 tablet (4 mg total) by mouth at bedtime.  30 tablet  11  . famotidine (PEPCID) 10 MG tablet Take 10 mg by mouth 2 (two) times daily as needed.        . fish oil-omega-3 fatty acids 1000 MG capsule Take by mouth daily.        . furosemide (LASIX) 40 MG tablet Take 1 tablet (40 mg total) by mouth daily.  90 tablet  3  . hydroxychloroquine (PLAQUENIL) 200 MG tablet Take by mouth daily. 2 TABLETS DAILY       . IRON PO Take by mouth daily.        . metoprolol (LOPRESSOR) 50 MG tablet Take 1 tablet (50 mg total) by mouth 2 (two) times daily.  60 tablet  11  .  potassium chloride (K-DUR) 10 MEQ tablet Take 10 mEq by mouth daily.        . traMADol (ULTRAM) 50 MG tablet Take 50 mg by mouth every 6 (six) hours as needed.        . warfarin (COUMADIN) 5 MG tablet Take 1 tablet (5 mg total) by mouth as directed.  90 tablet  3    No Known Allergies  Past Medical History  Diagnosis Date  . Coronary artery disease   . Lung disease, restrictive   . History of PFTs 01/20/2009  . Pleural effusion   . Chronic renal insufficiency   . CHF (congestive heart failure)     EF 40-50%  . Hypertension   . Dyslipidemia   . Aortic stenosis     MODERATE  . Atrial fibrillation   . Anemia     Past Surgical History  Procedure Date  . Coronary artery bypass graft 1999  . Coronary angioplasty with stent placement     OF THE SAPHENOUS VEIN GRAFT TO THE THIRD DIAGONAL.  HE HAS MODERATE TO SEVERE STENOSIS REMAINING ON HIS NATIVE LEFT CIRCUMFLEX PROXIMAL VESSEL  . Back surgery  History  Smoking status  . Never Smoker   Smokeless tobacco  . Not on file    History  Alcohol Use No    Family History  Problem Relation Age of Onset  . Heart failure Mother   . Heart attack Father   . Hypertension Father     Reviw of Systems:  Reviewed in the HPI.  All other systems are negative.  Physical Exam: BP 176/77  Pulse 56  Ht 5\' 5"  (1.651 m)  Wt 242 lb 1.9 oz (109.825 kg)  BMI 40.29 kg/m2 The patient is alert and oriented x 3.  The mood and affect are normal.   Skin: warm and dry.  Color is normal.    HEENT:   Normocephalic/atraumatic. He's wearing hearing aids. Neck is supple. His mucous membranes are moist.  Lungs: His lungs are clear.   Heart: Regular rate S1-S2. He has a soft murmur.    Abdomen: Abdominal exam reveals good bowel sounds. He is mildly obese.  Extremities:  Trace edema.  Neuro:  He started hearing. Otherwise his neuro exam is nonfocal  ECG: Sinus bradycardia. He has nonspecific ST and T wave adenopathies.  Assessment / Plan:

## 2011-05-30 ENCOUNTER — Ambulatory Visit (INDEPENDENT_AMBULATORY_CARE_PROVIDER_SITE_OTHER): Payer: Medicare Other | Admitting: *Deleted

## 2011-05-30 DIAGNOSIS — I4891 Unspecified atrial fibrillation: Secondary | ICD-10-CM

## 2011-05-30 LAB — POCT INR: INR: 2.9

## 2011-06-14 ENCOUNTER — Ambulatory Visit (INDEPENDENT_AMBULATORY_CARE_PROVIDER_SITE_OTHER): Payer: Medicare Other | Admitting: Pulmonary Disease

## 2011-06-14 ENCOUNTER — Encounter: Payer: Self-pay | Admitting: Pulmonary Disease

## 2011-06-14 VITALS — BP 120/84 | HR 69 | Temp 98.6°F | Ht 66.5 in | Wt 246.0 lb

## 2011-06-14 DIAGNOSIS — G4733 Obstructive sleep apnea (adult) (pediatric): Secondary | ICD-10-CM

## 2011-06-14 NOTE — Assessment & Plan Note (Signed)
The patient has not been compliant with CPAP for various reasons.  I have stressed to him the severity of his sleep apnea, and how it greatly impacts his underlying cardiac disease.  I have stressed to him it will increase his morbidity and mortality if he does not treat his sleep apnea.  If he is not willing to try CPAP again, I would consider putting on nocturnal oxygen, though this is not optimal treatment.  The patient is willing to try CPAP again, but would like to look at a lower profile mask.  If he has issues with REM rebound, we could consider REM suppressant medications.  I have also encouraged him to work aggressively on weight loss.

## 2011-06-14 NOTE — Patient Instructions (Signed)
Get back on cpap, and call me if having issues with tolerance or your dreaming issues. Work on weight loss followup with me in one year if doing well.

## 2011-06-14 NOTE — Progress Notes (Signed)
  Subjective:    Patient ID: Alex Orozco, male    DOB: April 24, 1930, 78 y.o.   MRN: [id number]  HPI The patient comes in today for followup of his very severe obstructive sleep apnea.  Unfortunately, he has not been wearing CPAP for many reasons.  He has had surgery on his skin cancers, and has had to let this heal before starting back.  He also notes increased dreaming associated with his CPAP use, and his had bad dreaming".  He would also like to look at a CPAP mass that has a lower profile.   Review of Systems  Constitutional: Negative for fever and unexpected weight change.  HENT: Negative for ear pain, nosebleeds, congestion, sore throat, rhinorrhea, sneezing, trouble swallowing, dental problem, postnasal drip and sinus pressure.   Eyes: Negative for redness and itching.  Respiratory: Negative for cough, chest tightness, shortness of breath and wheezing.   Cardiovascular: Positive for leg swelling. Negative for palpitations.  Gastrointestinal: Negative for nausea and vomiting.  Genitourinary: Negative for dysuria.  Musculoskeletal: Positive for joint swelling.  Skin: Negative for rash.  Neurological: Negative for headaches.  Hematological: Does not bruise/bleed easily.  Psychiatric/Behavioral: Negative for dysphoric mood. The patient is not nervous/anxious.        Objective:   Physical Exam Overweight male in no acute distress No skin breakdown or pressure necrosis from the CPAP mask Chest clear to auscultation Lower extremities with mild edema, no cyanosis Awake, but appears somewhat sleepy, moves all 4 extremities.       Assessment & Plan:

## 2011-07-11 ENCOUNTER — Ambulatory Visit (INDEPENDENT_AMBULATORY_CARE_PROVIDER_SITE_OTHER): Payer: Medicare Other | Admitting: Pharmacist

## 2011-07-11 DIAGNOSIS — I4891 Unspecified atrial fibrillation: Secondary | ICD-10-CM

## 2011-07-11 LAB — POCT INR: INR: 3.1

## 2011-08-08 ENCOUNTER — Ambulatory Visit (INDEPENDENT_AMBULATORY_CARE_PROVIDER_SITE_OTHER): Payer: Medicare Other

## 2011-08-08 DIAGNOSIS — I4891 Unspecified atrial fibrillation: Secondary | ICD-10-CM

## 2011-08-17 ENCOUNTER — Other Ambulatory Visit: Payer: Self-pay | Admitting: *Deleted

## 2011-08-17 MED ORDER — AMLODIPINE BESYLATE 2.5 MG PO TABS: 2.5000 mg | ORAL_TABLET | Freq: Every day | ORAL | Status: DC

## 2011-08-24 ENCOUNTER — Emergency Department (HOSPITAL_COMMUNITY): Payer: Medicare Other

## 2011-08-24 ENCOUNTER — Encounter (HOSPITAL_COMMUNITY): Payer: Self-pay | Admitting: Emergency Medicine

## 2011-08-24 ENCOUNTER — Other Ambulatory Visit: Payer: Self-pay

## 2011-08-24 ENCOUNTER — Inpatient Hospital Stay (HOSPITAL_COMMUNITY)
Admission: EM | Admit: 2011-08-24 | Discharge: 2011-08-29 | DRG: 291 | Disposition: A | Discharge status: Home / Self Care | Payer: Medicare Other | Attending: Family Medicine | Admitting: Family Medicine

## 2011-08-24 DIAGNOSIS — E785 Hyperlipidemia, unspecified: Secondary | ICD-10-CM | POA: Diagnosis present

## 2011-08-24 DIAGNOSIS — Z7902 Long term (current) use of antithrombotics/antiplatelets: Secondary | ICD-10-CM

## 2011-08-24 DIAGNOSIS — Z7982 Long term (current) use of aspirin: Secondary | ICD-10-CM

## 2011-08-24 DIAGNOSIS — R509 Fever, unspecified: Secondary | ICD-10-CM

## 2011-08-24 DIAGNOSIS — I4891 Unspecified atrial fibrillation: Secondary | ICD-10-CM | POA: Diagnosis present

## 2011-08-24 DIAGNOSIS — D696 Thrombocytopenia, unspecified: Secondary | ICD-10-CM | POA: Diagnosis present

## 2011-08-24 DIAGNOSIS — Z9861 Coronary angioplasty status: Secondary | ICD-10-CM

## 2011-08-24 DIAGNOSIS — N183 Chronic kidney disease, stage 3 unspecified: Secondary | ICD-10-CM | POA: Diagnosis present

## 2011-08-24 DIAGNOSIS — IMO0002 Reserved for concepts with insufficient information to code with codable children: Secondary | ICD-10-CM

## 2011-08-24 DIAGNOSIS — I129 Hypertensive chronic kidney disease with stage 1 through stage 4 chronic kidney disease, or unspecified chronic kidney disease: Secondary | ICD-10-CM | POA: Diagnosis present

## 2011-08-24 DIAGNOSIS — Z79899 Other long term (current) drug therapy: Secondary | ICD-10-CM

## 2011-08-24 DIAGNOSIS — I359 Nonrheumatic aortic valve disorder, unspecified: Secondary | ICD-10-CM | POA: Diagnosis present

## 2011-08-24 DIAGNOSIS — J189 Pneumonia, unspecified organism: Secondary | ICD-10-CM | POA: Diagnosis present

## 2011-08-24 DIAGNOSIS — I5021 Acute systolic (congestive) heart failure: Principal | ICD-10-CM | POA: Diagnosis present

## 2011-08-24 DIAGNOSIS — I509 Heart failure, unspecified: Secondary | ICD-10-CM

## 2011-08-24 DIAGNOSIS — Z951 Presence of aortocoronary bypass graft: Secondary | ICD-10-CM

## 2011-08-24 DIAGNOSIS — I2510 Atherosclerotic heart disease of native coronary artery without angina pectoris: Secondary | ICD-10-CM | POA: Diagnosis present

## 2011-08-24 DIAGNOSIS — R062 Wheezing: Secondary | ICD-10-CM

## 2011-08-24 HISTORY — DX: Unspecified osteoarthritis, unspecified site: M19.90

## 2011-08-24 LAB — BASIC METABOLIC PANEL
CO2: 29 mEq/L (ref 19–32)
Calcium: 9 mg/dL (ref 8.4–10.5)
Chloride: 102 mEq/L (ref 96–112)
Glucose, Bld: 102 mg/dL — ABNORMAL HIGH (ref 70–99)
Potassium: 3.7 mEq/L (ref 3.5–5.1)
Sodium: 141 mEq/L (ref 135–145)

## 2011-08-24 LAB — PROTIME-INR
INR: 2.18 — ABNORMAL HIGH (ref 0.00–1.49)
Prothrombin Time: 24.6 seconds — ABNORMAL HIGH (ref 11.6–15.2)

## 2011-08-24 LAB — CBC
MCV: 87.3 fL (ref 78.0–100.0)
Platelets: 136 10*3/uL — ABNORMAL LOW (ref 150–400)
RBC: 4.03 MIL/uL — ABNORMAL LOW (ref 4.22–5.81)
RDW: 13.3 % (ref 11.5–15.5)
WBC: 5.9 10*3/uL (ref 4.0–10.5)

## 2011-08-24 LAB — PRO B NATRIURETIC PEPTIDE: Pro B Natriuretic peptide (BNP): 4023 pg/mL — ABNORMAL HIGH (ref 0–450)

## 2011-08-24 LAB — DIFFERENTIAL
Basophils Absolute: 0 10*3/uL (ref 0.0–0.1)
Eosinophils Relative: 5 % (ref 0–5)
Lymphocytes Relative: 10 % — ABNORMAL LOW (ref 12–46)
Lymphs Abs: 0.6 10*3/uL — ABNORMAL LOW (ref 0.7–4.0)
Neutro Abs: 4.4 10*3/uL (ref 1.7–7.7)

## 2011-08-24 LAB — CARDIAC PANEL(CRET KIN+CKTOT+MB+TROPI): Troponin I: <0.3 ng/mL (ref <0.30)

## 2011-08-24 LAB — LACTIC ACID, PLASMA: Lactic Acid, Venous: 1 mmol/L (ref 0.5–2.2)

## 2011-08-24 MED ORDER — ATORVASTATIN CALCIUM 10 MG PO TABS
10.0000 mg | ORAL_TABLET | Freq: Every day | ORAL | Status: DC
  Administered 2011-08-25 – 2011-08-28 (×4): 10 mg via ORAL
  Filled 2011-08-24 (×5): qty 1

## 2011-08-24 MED ORDER — METHYLPREDNISOLONE SODIUM SUCC 125 MG IJ SOLR
125.0000 mg | Freq: Once | INTRAMUSCULAR | Status: AC
  Administered 2011-08-24: 125 mg via INTRAVENOUS
  Filled 2011-08-24: qty 2

## 2011-08-24 MED ORDER — METHYLPREDNISOLONE SODIUM SUCC 125 MG IJ SOLR
60.0000 mg | Freq: Four times a day (QID) | INTRAMUSCULAR | Status: DC
  Administered 2011-08-24 – 2011-08-25 (×2): 60 mg via INTRAVENOUS
  Filled 2011-08-24 (×2): qty 2
  Filled 2011-08-24 (×4): qty 0.96

## 2011-08-24 MED ORDER — ALBUTEROL SULFATE (5 MG/ML) 0.5% IN NEBU
2.5000 mg | INHALATION_SOLUTION | RESPIRATORY_TRACT | Status: DC | PRN
  Filled 2011-08-24: qty 0.5

## 2011-08-24 MED ORDER — ALBUTEROL SULFATE (5 MG/ML) 0.5% IN NEBU
INHALATION_SOLUTION | RESPIRATORY_TRACT | Status: AC
  Filled 2011-08-24: qty 2

## 2011-08-24 MED ORDER — TRAMADOL HCL 50 MG PO TABS
50.0000 mg | ORAL_TABLET | Freq: Four times a day (QID) | ORAL | Status: DC | PRN
  Filled 2011-08-24: qty 1

## 2011-08-24 MED ORDER — SODIUM CHLORIDE 0.9 % IJ SOLN
3.0000 mL | INTRAMUSCULAR | Status: DC | PRN
  Administered 2011-08-26 – 2011-08-28 (×3): 3 mL via INTRAVENOUS

## 2011-08-24 MED ORDER — WARFARIN - PHARMACIST DOSING INPATIENT
Freq: Every day | Status: DC
  Administered 2011-08-25: 1
  Administered 2011-08-27: 18:00:00

## 2011-08-24 MED ORDER — LEVOFLOXACIN IN D5W 750 MG/150ML IV SOLN
750.0000 mg | INTRAVENOUS | Status: DC
  Filled 2011-08-24: qty 150

## 2011-08-24 MED ORDER — ONDANSETRON HCL 4 MG/2ML IJ SOLN: 4.0000 mg | Freq: Four times a day (QID) | INTRAMUSCULAR | Status: DC | PRN

## 2011-08-24 MED ORDER — ALBUTEROL (5 MG/ML) CONTINUOUS INHALATION SOLN
10.0000 mg/h | INHALATION_SOLUTION | Freq: Once | RESPIRATORY_TRACT | Status: AC
  Administered 2011-08-24: 10 mg/h via RESPIRATORY_TRACT

## 2011-08-24 MED ORDER — ALBUTEROL SULFATE (5 MG/ML) 0.5% IN NEBU
INHALATION_SOLUTION | RESPIRATORY_TRACT | Status: AC
  Filled 2011-08-24: qty 1

## 2011-08-24 MED ORDER — POTASSIUM CHLORIDE ER 10 MEQ PO TBCR
10.0000 meq | EXTENDED_RELEASE_TABLET | Freq: Every day | ORAL | Status: DC
  Administered 2011-08-24 – 2011-08-29 (×6): 10 meq via ORAL
  Filled 2011-08-24 (×6): qty 1

## 2011-08-24 MED ORDER — IPRATROPIUM BROMIDE 0.02 % IN SOLN
0.5000 mg | Freq: Once | RESPIRATORY_TRACT | Status: AC
  Administered 2011-08-24: 0.5 mg via RESPIRATORY_TRACT

## 2011-08-24 MED ORDER — CLOPIDOGREL BISULFATE 75 MG PO TABS
75.0000 mg | ORAL_TABLET | Freq: Every day | ORAL | Status: DC
  Administered 2011-08-25 – 2011-08-29 (×5): 75 mg via ORAL
  Filled 2011-08-24 (×6): qty 1

## 2011-08-24 MED ORDER — WARFARIN SODIUM 5 MG PO TABS
5.0000 mg | ORAL_TABLET | ORAL | Status: AC
  Administered 2011-08-25: 5 mg via ORAL
  Filled 2011-08-24: qty 1

## 2011-08-24 MED ORDER — ALBUTEROL SULFATE (5 MG/ML) 0.5% IN NEBU
2.5000 mg | INHALATION_SOLUTION | RESPIRATORY_TRACT | Status: DC | PRN
  Administered 2011-08-24: 2.5 mg via RESPIRATORY_TRACT
  Filled 2011-08-24: qty 0.5

## 2011-08-24 MED ORDER — IPRATROPIUM BROMIDE 0.02 % IN SOLN
0.5000 mg | RESPIRATORY_TRACT | Status: DC | PRN
  Administered 2011-08-24: 0.5 mg via RESPIRATORY_TRACT
  Filled 2011-08-24 (×3): qty 2.5

## 2011-08-24 MED ORDER — SODIUM CHLORIDE 0.9 % IJ SOLN
3.0000 mL | Freq: Two times a day (BID) | INTRAMUSCULAR | Status: DC
  Administered 2011-08-25 – 2011-08-29 (×9): 3 mL via INTRAVENOUS

## 2011-08-24 MED ORDER — SODIUM CHLORIDE 0.9 % IV SOLN: 250.0000 mL | INTRAVENOUS | Status: DC | PRN

## 2011-08-24 MED ORDER — DEXTROSE 5 % IV SOLN
1.0000 g | Freq: Once | INTRAVENOUS | Status: AC
  Administered 2011-08-24: 1 g via INTRAVENOUS
  Filled 2011-08-24: qty 10

## 2011-08-24 MED ORDER — HYDROXYCHLOROQUINE SULFATE 200 MG PO TABS
200.0000 mg | ORAL_TABLET | Freq: Two times a day (BID) | ORAL | Status: DC
  Administered 2011-08-24 – 2011-08-29 (×10): 200 mg via ORAL
  Filled 2011-08-24 (×11): qty 1

## 2011-08-24 MED ORDER — ASPIRIN 81 MG PO TABS: 81.0000 mg | ORAL_TABLET | Freq: Every day | ORAL | Status: DC

## 2011-08-24 MED ORDER — GUAIFENESIN-DM 100-10 MG/5ML PO SYRP: 5.0000 mL | ORAL_SOLUTION | ORAL | Status: DC | PRN

## 2011-08-24 MED ORDER — ALBUTEROL SULFATE (5 MG/ML) 0.5% IN NEBU
2.5000 mg | INHALATION_SOLUTION | Freq: Four times a day (QID) | RESPIRATORY_TRACT | Status: DC
  Administered 2011-08-24 – 2011-08-25 (×3): 2.5 mg via RESPIRATORY_TRACT
  Filled 2011-08-24 (×3): qty 0.5

## 2011-08-24 MED ORDER — DOXAZOSIN MESYLATE 4 MG PO TABS
4.0000 mg | ORAL_TABLET | Freq: Every day | ORAL | Status: DC
  Administered 2011-08-24 – 2011-08-28 (×5): 4 mg via ORAL
  Filled 2011-08-24 (×6): qty 1

## 2011-08-24 MED ORDER — WARFARIN SODIUM 2.5 MG PO TABS
2.5000 mg | ORAL_TABLET | ORAL | Status: DC
  Administered 2011-08-24 – 2011-08-27 (×3): 2.5 mg via ORAL
  Filled 2011-08-24 (×4): qty 1

## 2011-08-24 MED ORDER — METOPROLOL TARTRATE 50 MG PO TABS
50.0000 mg | ORAL_TABLET | Freq: Two times a day (BID) | ORAL | Status: DC
  Administered 2011-08-24 – 2011-08-28 (×7): 50 mg via ORAL
  Filled 2011-08-24 (×9): qty 1

## 2011-08-24 MED ORDER — ALBUTEROL SULFATE (5 MG/ML) 0.5% IN NEBU
5.0000 mg | INHALATION_SOLUTION | Freq: Once | RESPIRATORY_TRACT | Status: AC
  Administered 2011-08-24: 5 mg via RESPIRATORY_TRACT

## 2011-08-24 MED ORDER — ACETAMINOPHEN 325 MG PO TABS
650.0000 mg | ORAL_TABLET | ORAL | Status: DC | PRN
  Administered 2011-08-24: 650 mg via ORAL
  Filled 2011-08-24: qty 2

## 2011-08-24 MED ORDER — LEVOFLOXACIN IN D5W 750 MG/150ML IV SOLN
750.0000 mg | INTRAVENOUS | Status: AC
  Administered 2011-08-24: 750 mg via INTRAVENOUS
  Filled 2011-08-24: qty 150

## 2011-08-24 MED ORDER — MAGNESIUM SULFATE 40 MG/ML IJ SOLN
2.0000 g | INTRAMUSCULAR | Status: AC
  Administered 2011-08-24: 2 g via INTRAVENOUS
  Filled 2011-08-24: qty 50

## 2011-08-24 MED ORDER — FUROSEMIDE 10 MG/ML IJ SOLN
80.0000 mg | Freq: Once | INTRAMUSCULAR | Status: AC
  Administered 2011-08-24: 80 mg via INTRAVENOUS
  Filled 2011-08-24: qty 8

## 2011-08-24 MED ORDER — DM-GUAIFENESIN ER 30-600 MG PO TB12
1.0000 | ORAL_TABLET | Freq: Two times a day (BID) | ORAL | Status: DC
  Administered 2011-08-25 – 2011-08-29 (×10): 1 via ORAL
  Filled 2011-08-24 (×11): qty 1

## 2011-08-24 MED ORDER — ASPIRIN 81 MG PO CHEW
81.0000 mg | CHEWABLE_TABLET | Freq: Every day | ORAL | Status: DC
  Administered 2011-08-25 – 2011-08-29 (×5): 81 mg via ORAL
  Filled 2011-08-24 (×5): qty 1

## 2011-08-24 MED ORDER — LEVOFLOXACIN IN D5W 500 MG/100ML IV SOLN
500.0000 mg | INTRAVENOUS | Status: DC
  Administered 2011-08-25: 500 mg via INTRAVENOUS
  Filled 2011-08-24 (×2): qty 100

## 2011-08-24 MED ORDER — FUROSEMIDE 10 MG/ML IJ SOLN
60.0000 mg | Freq: Two times a day (BID) | INTRAMUSCULAR | Status: DC
  Administered 2011-08-25 – 2011-08-28 (×7): 60 mg via INTRAVENOUS
  Filled 2011-08-24 (×10): qty 6

## 2011-08-24 MED ORDER — FAMOTIDINE 10 MG PO TABS
10.0000 mg | ORAL_TABLET | Freq: Two times a day (BID) | ORAL | Status: DC | PRN
  Filled 2011-08-24: qty 1

## 2011-08-24 MED ORDER — ALBUTEROL SULFATE (5 MG/ML) 0.5% IN NEBU: 2.5000 mg | INHALATION_SOLUTION | Freq: Once | RESPIRATORY_TRACT | Status: DC

## 2011-08-24 MED ORDER — DEXTROSE 5 % IV SOLN
500.0000 mg | Freq: Once | INTRAVENOUS | Status: AC
  Administered 2011-08-24: 500 mg via INTRAVENOUS
  Filled 2011-08-24: qty 500

## 2011-08-24 MED ORDER — IPRATROPIUM BROMIDE 0.02 % IN SOLN
0.5000 mg | Freq: Four times a day (QID) | RESPIRATORY_TRACT | Status: DC
  Administered 2011-08-24 – 2011-08-25 (×3): 0.5 mg via RESPIRATORY_TRACT
  Filled 2011-08-24 (×3): qty 2.5

## 2011-08-24 NOTE — H&P (Signed)
History and Physical  Alex Orozco WGN:[id number] DOB: [birth year] DOA: 08/24/2011  Referring physician: Elijio Miles, MD PCP: Kaleen Mask, MD, MD  Pulmonologist: Marcelyn Bruins, MD Cardiologist: Leodis Sias, MD  Chief Complaint: Fever  HPI:  78 year old man presents the emergency department with a four-day history of shortness of breath. Symptoms began with shortness of breath and wheezing and worsening dyspnea on exertion. Fever up to 102. Suspect primary care physician 2 days ago and was pending further evaluation and treatment pending further evaluation and treatment.started on Ceftin. Despite this his condition continued to worsen with progressive shortness of breath.  Emergency department evaluation was notable for pulmonary edema on chest x-ray, low-grade fever and moderate tachypnea. Impression was pneumonia. History with Rocephin, Zithromax and nebulizer treatments.  Review of Systems:  Positive for fever  negative for changes to his vision, sore throat, rash, muscle aches, chest pain, dysuria, bleeding, nausea, vomiting, abdominal pain, diarrhea  Past Medical History  Diagnosis Date  . Coronary artery disease   . Lung disease, restrictive   . History of PFTs 01/20/2009  . Pleural effusion   . Chronic renal insufficiency   . CHF (congestive heart failure)     EF 40-50%  . Hypertension   . Dyslipidemia   . Aortic stenosis     MODERATE  . Atrial fibrillation   . Anemia    Past Surgical History  Procedure Date  . Coronary artery bypass graft 1999  . Coronary angioplasty with stent placement     OF THE SAPHENOUS VEIN GRAFT TO THE THIRD DIAGONAL.  HE HAS MODERATE TO SEVERE STENOSIS REMAINING ON HIS NATIVE LEFT CIRCUMFLEX PROXIMAL VESSEL  . Back surgery    Social History:  reports that he has never smoked. He does not have any smokeless tobacco history on file. He reports that he does not drink alcohol or use illicit drugs.  No Known Allergies  Family History    Problem Relation Age of Onset  . Heart failure Mother   . Heart attack Father   . Hypertension Father     Prior to Admission medications   Medication Sig Start Date End Date Taking? Authorizing Provider  acetaminophen (TYLENOL) 500 MG tablet Take 500 mg by mouth every 6 (six) hours as needed. For pain   Yes Historical Provider, MD  amLODipine (NORVASC) 2.5 MG tablet Take 1 tablet (2.5 mg total) by mouth daily. 08/17/11  Yes Vesta Mixer, MD  aspirin 81 MG tablet Take 81 mg by mouth daily.     Yes Historical Provider, MD  cefUROXime (CEFTIN) 500 MG tablet Take 500 mg by mouth 2 (two) times daily. 08/22/11 08/31/11 Yes Historical Provider, MD  Cholecalciferol (VITAMIN D-3) 5000 UNITS TABS Take 1 tablet by mouth daily.    Yes Historical Provider, MD  clopidogrel (PLAVIX) 75 MG tablet TAKE 1 TABLET BY MOUTH EVERY DAY 02/20/11  Yes Vesta Mixer, MD  CRESTOR 10 MG tablet TAKE 1 TABELT BY MOUTH EVERY DAY 03/20/11  Yes Vesta Mixer, MD  doxazosin (CARDURA) 4 MG tablet Take 1 tablet (4 mg total) by mouth at bedtime. 02/02/11  Yes Vesta Mixer, MD  famotidine (PEPCID) 10 MG tablet Take 10 mg by mouth 2 (two) times daily as needed. For acid   Yes Historical Provider, MD  Ferrous Gluconate (IRON) 240 (27 FE) MG TABS Take 1 tablet by mouth daily.   Yes Historical Provider, MD  fish oil-omega-3 fatty acids 1000 MG capsule Take 1 g by mouth daily.  Yes Historical Provider, MD  furosemide (LASIX) 40 MG tablet Take 40 mg by mouth 2 (two) times daily. 10/27/10  Yes Vesta Mixer, MD  hydroxychloroquine (PLAQUENIL) 200 MG tablet Take 200 mg by mouth 2 (two) times daily.    Yes Historical Provider, MD  metoprolol (LOPRESSOR) 50 MG tablet Take 1 tablet (50 mg total) by mouth 2 (two) times daily. 09/05/10 09/05/11 Yes Vesta Mixer, MD  potassium chloride (K-DUR) 10 MEQ tablet Take 10 mEq by mouth daily.     Yes Historical Provider, MD  traMADol (ULTRAM) 50 MG tablet Take 50 mg by mouth every 6 (six)  hours as needed. For pain   Yes Historical Provider, MD  warfarin (COUMADIN) 5 MG tablet Take 2.5-5 mg by mouth daily. Take 0.5 tablet on Tuesday, Thursday, Saturday, Sunday.  Take 1 tablet on Monday, Wednesday, friday   Yes Historical Provider, MD   Physical Exam: Filed Vitals:   08/24/11 1448 08/24/11 1549 08/24/11 1740  BP: 180/69 182/123   Pulse: 72 73   Temp:  100.5 F (38.1 C)   TempSrc:  Oral   Resp: 25 26   SpO2: 94% 95% 100%     General:  Appears ill but nontoxic.  Eyes: Left pupil 2 mm and round and reactive. Right pupil eccentric. Normal lids and irises.  ENT: Slightly hard of hearing.  Neck: No lymphadenopathy or masses. No thyromegaly.  Cardiovascular: Regular rate and rhythm. No murmur, rub, gallop. 2+ bilateral lower extremity edema.  Respiratory: Diffuse wheezes with prolonged expiration. Tachypneic although not dyspneic. Able to speak in full senses.  Abdomen: Soft, nontender, nondistended.  Skin: Grossly unremarkable.  Musculoskeletal: Grossly normal tone and strength in the upper and lower extremities.  Psychiatric: Grossly normal mood and affect. Speech fluent and appropriate.  Labs on Admission:  Basic Metabolic Panel:  Lab 08/24/11 9604  NA 141  K 3.7  CL 102  CO2 29  GLUCOSE 102*  BUN 21  CREATININE 1.96*  CALCIUM 9.0  MG --  PHOS --   CBC:  Lab 08/24/11 1613  WBC 5.9  NEUTROABS 4.4  HGB 11.5*  HCT 35.2*  MCV 87.3  PLT 136*   Radiological Exams on Admission: Dg Chest 2 View  08/24/2011  *RADIOLOGY REPORT*  Clinical Data: Shortness of breath.  CHEST - 2 VIEW  Comparison: 05/17/2009.  Findings: The heart is enlarged but stable.  Surgical changes from bypass surgery are again noted.  There is central vascular congestion and probable mild perihilar pulmonary edema.  No pleural effusions or pneumothorax.  Stable advanced degenerative changes involving both shoulders.  IMPRESSION: Mild CHF.  Original Report Authenticated By: P. Loralie Champagne, M.D.   EKG: Independently reviewed. Normal sinus rhythm. No acute change seen.  Assessment/Plan 1. Probable pneumonia: Clinical history examination was suggestive of pneumonia. Empiric therapy for community acquired pneumonia. Given amount of wheezing will continue albuterol treatments and add steroids. Check influenza. 2. Systolic congestive heart failure, acute component suspect: IV Lasix. Ejection fraction 35-40%, moderate aortic stenosis by echocardiogram 05/10/2009. 3. Chronic kidney disease stage III: Appears to be at baseline. 4. Atrial fibrillation: Appears stable. Continue warfarin and metoprolol. 5. Coronary artery disease: Appears stable. Continue aspirin, Plavix, Crestor.  Code Status: Limited. No CPR. Intubation acceptable. Family Communication: Discussed with wife and daughter at bedside. All questions answered to their apparent satisfaction. Disposition Plan: Pending further evaluation and treatment.  Brendia Sacks, MD  [facility] Pager [phone] 08/24/2011, 5:47 PM

## 2011-08-24 NOTE — Progress Notes (Signed)
ANTICOAGULATION CONSULT NOTE - Initial Consult  Pharmacy Consult for Coumadin/Levaquin Indication: atrial fibrillation, PNA  No Known Allergies  Patient Measurements: Height: 5\' 8"  (172.7 cm) Weight: 245 lb 9.5 oz (111.4 kg) (a scale) IBW/kg (Calculated) : 68.4  Heparin Dosing Weight:    Vital Signs: Temp: 101 F (38.3 C) (04/04 1938) Temp src: Oral (04/04 1938) BP: 165/67 mmHg (04/04 1938) Pulse Rate: 85  (04/04 1938)  Labs:  Basename 08/24/11 1613  HGB 11.5*  HCT 35.2*  PLT 136*  APTT --  LABPROT 24.6*  INR 2.18*  HEPARINUNFRC --  CREATININE 1.96*  CKTOTAL --  CKMB --  TROPONINI --   Estimated Creatinine Clearance: 37 ml/min (by C-G formula based on Cr of 1.96).  Medical History: Past Medical History  Diagnosis Date  . Coronary artery disease   . Lung disease, restrictive   . History of PFTs 01/20/2009  . Pleural effusion   . Chronic renal insufficiency   . CHF (congestive heart failure)     EF 40-50%  . Hypertension   . Dyslipidemia   . Aortic stenosis     MODERATE  . Atrial fibrillation   . Anemia   . Difficult intubation   . Shortness of breath   . Arthritis     Medications:  Prescriptions prior to admission  Medication Sig Dispense Refill  . acetaminophen (TYLENOL) 500 MG tablet Take 500 mg by mouth every 6 (six) hours as needed. For pain      . amLODipine (NORVASC) 2.5 MG tablet Take 1 tablet (2.5 mg total) by mouth daily.  90 tablet  1  . aspirin 81 MG tablet Take 81 mg by mouth daily.        . cefUROXime (CEFTIN) 500 MG tablet Take 500 mg by mouth 2 (two) times daily.      . Cholecalciferol (VITAMIN D-3) 5000 UNITS TABS Take 1 tablet by mouth daily.       . clopidogrel (PLAVIX) 75 MG tablet TAKE 1 TABLET BY MOUTH EVERY DAY  90 tablet  3  . CRESTOR 10 MG tablet TAKE 1 TABELT BY MOUTH EVERY DAY  90 tablet  3  . doxazosin (CARDURA) 4 MG tablet Take 1 tablet (4 mg total) by mouth at bedtime.  30 tablet  11  . famotidine (PEPCID) 10 MG tablet  Take 10 mg by mouth 2 (two) times daily as needed. For acid      . Ferrous Gluconate (IRON) 240 (27 FE) MG TABS Take 1 tablet by mouth daily.      . fish oil-omega-3 fatty acids 1000 MG capsule Take 1 g by mouth daily.       . furosemide (LASIX) 40 MG tablet Take 40 mg by mouth 2 (two) times daily.      . hydroxychloroquine (PLAQUENIL) 200 MG tablet Take 200 mg by mouth 2 (two) times daily.       . metoprolol (LOPRESSOR) 50 MG tablet Take 1 tablet (50 mg total) by mouth 2 (two) times daily.  60 tablet  11  . potassium chloride (K-DUR) 10 MEQ tablet Take 10 mEq by mouth daily.        . traMADol (ULTRAM) 50 MG tablet Take 50 mg by mouth every 6 (six) hours as needed. For pain      . warfarin (COUMADIN) 5 MG tablet Take 2.5-5 mg by mouth daily. Take 0.5 tablet on Tuesday, Thursday, Saturday, Sunday.  Take 1 tablet on Monday, Wednesday, friday  Assessment: Fever, SOB= PNA  Anticoagulations: Coumadin for afib. Current INR in goal range 2.18. Dose 5mg  MWF, 2.5mg  TTSS  ID: PNA. Scr 1.96 with estimated CrCl 37. Adjust Levaquin for renal function.  Goal of Therapy:  INR 2-3   Plan:  Resume home Coumadin regimen 5mg  MWF, 2.5mg  TTSS Levaquin 750mg  IV x 1 then Adjust Levaquin to 500mg  IV/24h.  Misty Stanley Stillinger 08/24/2011,7:55 PM

## 2011-08-24 NOTE — ED Notes (Signed)
4709-01 Ready 

## 2011-08-24 NOTE — ED Provider Notes (Signed)
History     CSN: [id number]  Arrival date & time 08/24/11  1439   None     Chief Complaint  Patient presents with  . Shortness of Breath    (Consider location/radiation/quality/duration/timing/severity/associated sxs/prior treatment) HPI CC sob and wheezing onset 5 d ago, moderate, worse with exertion, relieved with rest, associated with fever and cough.  Pt seen by his pcp 2 d ago and started on ceftin with no relief.  Pt has h/o asthma, has never required admission or intubation for this.   Past Medical History  Diagnosis Date  . Coronary artery disease   . Lung disease, restrictive   . History of PFTs 01/20/2009  . Pleural effusion   . Chronic renal insufficiency   . CHF (congestive heart failure)     EF 40-50%  . Hypertension   . Dyslipidemia   . Aortic stenosis     MODERATE  . Atrial fibrillation   . Anemia   . Difficult intubation   . Shortness of breath   . Arthritis     Past Surgical History  Procedure Date  . Coronary artery bypass graft 1999  . Coronary angioplasty with stent placement     OF THE SAPHENOUS VEIN GRAFT TO THE THIRD DIAGONAL.  HE HAS MODERATE TO SEVERE STENOSIS REMAINING ON HIS NATIVE LEFT CIRCUMFLEX PROXIMAL VESSEL  . Back surgery   . Hernia repair   . Dccv   . Cataract extraction     bilateral    Family History  Problem Relation Age of Onset  . Heart failure Mother   . Heart attack Father   . Hypertension Father     History  Substance Use Topics  . Smoking status: Never Smoker   . Smokeless tobacco: Never Used  . Alcohol Use: No      Review of Systems  Constitutional: Positive for fever, chills and fatigue.  Respiratory: Positive for cough, shortness of breath and wheezing. Negative for stridor.   Cardiovascular: Negative for chest pain and palpitations.  Gastrointestinal: Negative for abdominal pain.  Skin: Negative for rash.  All other systems reviewed and are negative.    Allergies  Review of patient's allergies  indicates no known allergies.  Home Medications   No current outpatient prescriptions on file.  BP 160/77  Pulse 64  Temp(Src) 97.2 F (36.2 C) (Oral)  Resp 22  Ht 5\' 8"  (1.727 m)  Wt 242 lb 11.6 oz (110.1 kg)  BMI 36.91 kg/m2  SpO2 95%  Physical Exam  Nursing note and vitals reviewed. Constitutional: He appears well-developed and well-nourished.  HENT:  Head: Normocephalic and atraumatic.  Eyes: Right eye exhibits no discharge. Left eye exhibits no discharge.  Neck: Normal range of motion. Neck supple.  Cardiovascular: Normal rate, regular rhythm and normal heart sounds.   Pulmonary/Chest: Tachypnea noted. He is in respiratory distress. He has wheezes in the right upper field, the right middle field, the right lower field, the left upper field, the left middle field and the left lower field.  Abdominal: Soft. There is no tenderness.  Musculoskeletal: Normal range of motion. He exhibits no tenderness.  Neurological: He is alert.  Skin: Skin is warm and dry.  Psychiatric: He has a normal mood and affect. His behavior is normal.    ED Course  Procedures (including critical care time)  Labs Reviewed  CBC - Abnormal; Notable for the following:    RBC 4.03 (*)    Hemoglobin 11.5 (*)    HCT 35.2 (*)  Platelets 136 (*)    All other components within normal limits  DIFFERENTIAL - Abnormal; Notable for the following:    Lymphocytes Relative 10 (*)    Lymphs Abs 0.6 (*)    All other components within normal limits  BASIC METABOLIC PANEL - Abnormal; Notable for the following:    Glucose, Bld 102 (*)    Creatinine, Ser 1.96 (*)    GFR calc non Af Amer 31 (*)    GFR calc Af Amer 36 (*)    All other components within normal limits  PROTIME-INR - Abnormal; Notable for the following:    Prothrombin Time 24.6 (*)    INR 2.18 (*)    All other components within normal limits  PRO B NATRIURETIC PEPTIDE - Abnormal; Notable for the following:    Pro B Natriuretic peptide (BNP)  4023.0 (*)    All other components within normal limits  BASIC METABOLIC PANEL - Abnormal; Notable for the following:    Glucose, Bld 145 (*)    BUN 24 (*)    Creatinine, Ser 2.01 (*)    GFR calc non Af Amer 30 (*)    GFR calc Af Amer 35 (*)    All other components within normal limits  CBC - Abnormal; Notable for the following:    WBC 3.9 (*)    RBC 3.76 (*)    Hemoglobin 10.9 (*)    HCT 32.9 (*)    Platelets 134 (*)    All other components within normal limits  PRO B NATRIURETIC PEPTIDE - Abnormal; Notable for the following:    Pro B Natriuretic peptide (BNP) 5406.0 (*)    All other components within normal limits  PROTIME-INR - Abnormal; Notable for the following:    Prothrombin Time 25.1 (*)    INR 2.23 (*)    All other components within normal limits  LACTIC ACID, PLASMA  CULTURE, SPUTUM-ASSESSMENT  STREP PNEUMONIAE URINARY ANTIGEN  INFLUENZA PANEL BY PCR  CARDIAC PANEL(CRET KIN+CKTOT+MB+TROPI)  CARDIAC PANEL(CRET KIN+CKTOT+MB+TROPI)  MAGNESIUM  CULTURE, BLOOD (ROUTINE X 2)  CULTURE, BLOOD (ROUTINE X 2)  LEGIONELLA ANTIGEN, URINE  CARDIAC PANEL(CRET KIN+CKTOT+MB+TROPI)  CULTURE, RESPIRATORY   Dg Chest 2 View  08/24/2011  *RADIOLOGY REPORT*  Clinical Data: Shortness of breath.  CHEST - 2 VIEW  Comparison: 05/17/2009.  Findings: The heart is enlarged but stable.  Surgical changes from bypass surgery are again noted.  There is central vascular congestion and probable mild perihilar pulmonary edema.  No pleural effusions or pneumothorax.  Stable advanced degenerative changes involving both shoulders.  IMPRESSION: Mild CHF.  Original Report Authenticated By: P. Loralie Champagne, M.D.     1. CHF (congestive heart failure)   2. Wheezing   3. Fever   4. CAD (coronary artery disease)       Date: 08/24/2011  Rate: 72   Rhythm: normal sinus rhythm  QRS Axis: normal  Intervals: normal  ST/T Wave abnormalities: nonspecific T wave changes  Conduction Disutrbances:none   Narrative Interpretation:   Old EKG Reviewed: unchanged   MDM  Pt here with fever, cough, sob, wheezing.  Worsening on ceftin.  C/f CAP, sepsis protocol initiated, pt is hypertensive and has h/o chf so holding off on iv hydration until I see cxr.  Pt has significant insp and exp wheezing, giving iv solumedrol, magnesium RT to give duoneb txs.  Doubt PE, pt is on coumadin.  Doubt acs, pt has no cp or chest discomfort, will get ekg.   cxr c/w  multiple opacities, starting rocephin and azithromycin for CAP.  Internal medicine consulted, will admit, pt stable.         Elijio Miles, MD 08/25/11 1143

## 2011-08-24 NOTE — Progress Notes (Signed)
Patient deferred foley catheter insertion at this time.  Educated in strict intake and output monitoring.  Urinal provided.  Continuing to monitor.

## 2011-08-24 NOTE — ED Notes (Signed)
Pt here from home with c/o sob that started this past Monday , pt has some au diable  Wheezes at this time pt is on lasix

## 2011-08-24 NOTE — ED Notes (Signed)
Admitting MD at bedside.

## 2011-08-25 DIAGNOSIS — I359 Nonrheumatic aortic valve disorder, unspecified: Secondary | ICD-10-CM

## 2011-08-25 LAB — CBC
HCT: 32.9 % — ABNORMAL LOW (ref 39.0–52.0)
Platelets: 134 10*3/uL — ABNORMAL LOW (ref 150–400)
RDW: 13.2 % (ref 11.5–15.5)
WBC: 3.9 10*3/uL — ABNORMAL LOW (ref 4.0–10.5)

## 2011-08-25 LAB — PRO B NATRIURETIC PEPTIDE: Pro B Natriuretic peptide (BNP): 5406 pg/mL — ABNORMAL HIGH (ref 0–450)

## 2011-08-25 LAB — LEGIONELLA ANTIGEN, URINE: Legionella Antigen, Urine: NEGATIVE

## 2011-08-25 LAB — CARDIAC PANEL(CRET KIN+CKTOT+MB+TROPI)
CK, MB: 4.3 ng/mL — ABNORMAL HIGH (ref 0.3–4.0)
Relative Index: 2.4 (ref 0.0–2.5)
Relative Index: 2.9 — ABNORMAL HIGH (ref 0.0–2.5)
Troponin I: <0.3 ng/mL (ref <0.30)
Troponin I: <0.3 ng/mL (ref <0.30)

## 2011-08-25 LAB — INFLUENZA PANEL BY PCR (TYPE A & B)
H1N1 flu by pcr: NOT DETECTED
Influenza A By PCR: NEGATIVE
Influenza B By PCR: NEGATIVE

## 2011-08-25 LAB — MAGNESIUM: Magnesium: 2.4 mg/dL (ref 1.5–2.5)

## 2011-08-25 LAB — BASIC METABOLIC PANEL
Chloride: 100 mEq/L (ref 96–112)
Creatinine, Ser: 2.01 mg/dL — ABNORMAL HIGH (ref 0.50–1.35)
GFR calc Af Amer: 35 mL/min — ABNORMAL LOW (ref >90)
GFR calc non Af Amer: 30 mL/min — ABNORMAL LOW (ref >90)

## 2011-08-25 LAB — PROTIME-INR
INR: 2.23 — ABNORMAL HIGH (ref 0.00–1.49)
Prothrombin Time: 25.1 seconds — ABNORMAL HIGH (ref 11.6–15.2)

## 2011-08-25 LAB — STREP PNEUMONIAE URINARY ANTIGEN: Strep Pneumo Urinary Antigen: NEGATIVE

## 2011-08-25 LAB — EXPECTORATED SPUTUM ASSESSMENT W GRAM STAIN, RFLX TO RESP C

## 2011-08-25 MED ORDER — ALBUTEROL SULFATE (5 MG/ML) 0.5% IN NEBU: 2.5000 mg | INHALATION_SOLUTION | Freq: Four times a day (QID) | RESPIRATORY_TRACT | Status: DC

## 2011-08-25 MED ORDER — IPRATROPIUM BROMIDE 0.02 % IN SOLN
0.5000 mg | RESPIRATORY_TRACT | Status: DC
  Administered 2011-08-25 – 2011-08-26 (×8): 0.5 mg via RESPIRATORY_TRACT
  Filled 2011-08-25 (×7): qty 2.5

## 2011-08-25 MED ORDER — MENTHOL 3 MG MT LOZG
1.0000 | LOZENGE | OROMUCOSAL | Status: DC | PRN
  Filled 2011-08-25: qty 9

## 2011-08-25 MED ORDER — ALBUTEROL SULFATE (5 MG/ML) 0.5% IN NEBU
2.5000 mg | INHALATION_SOLUTION | RESPIRATORY_TRACT | Status: DC
  Administered 2011-08-25 – 2011-08-26 (×8): 2.5 mg via RESPIRATORY_TRACT
  Filled 2011-08-25 (×7): qty 0.5

## 2011-08-25 MED ORDER — IPRATROPIUM BROMIDE 0.02 % IN SOLN: 0.5000 mg | RESPIRATORY_TRACT | Status: DC

## 2011-08-25 MED ORDER — METHYLPREDNISOLONE SODIUM SUCC 125 MG IJ SOLR
60.0000 mg | Freq: Two times a day (BID) | INTRAMUSCULAR | Status: DC
  Administered 2011-08-25 – 2011-08-28 (×6): 60 mg via INTRAVENOUS
  Filled 2011-08-25 (×6): qty 0.96

## 2011-08-25 NOTE — Progress Notes (Signed)
ANTICOAGULATION CONSULT NOTE - Follow Up Consult  Pharmacy Consult for coumadin Indication: atrial fibrillation  No Known Allergies  Patient Measurements: Height: 5\' 8"  (172.7 cm) Weight: 242 lb 11.6 oz (110.1 kg) (a scale) IBW/kg (Calculated) : 68.4    Vital Signs: Temp: 97.2 F (36.2 C) (04/05 0639) BP: 160/77 mmHg (04/05 0639) Pulse Rate: 64  (04/05 0639)  Labs:  Basename 08/25/11 0320 08/24/11 2026 08/24/11 1613  HGB 10.9* -- 11.5*  HCT 32.9* -- 35.2*  PLT 134* -- 136*  APTT -- -- --  LABPROT 25.1* -- 24.6*  INR 2.23* -- 2.18*  HEPARINUNFRC -- -- --  CREATININE 2.01* -- 1.96*  CKTOTAL 140 142 --  CKMB 3.3 2.8 --  TROPONINI <0.30 <0.30 --   Estimated Creatinine Clearance: 35.9 ml/min (by C-G formula based on Cr of 2.01).   Medications:  Scheduled:    . albuterol  2.5 mg Nebulization Q4H   And  . ipratropium  0.5 mg Nebulization Q4H  . albuterol  5 mg Nebulization Once  . albuterol      . albuterol  10 mg/hr Nebulization Once  . aspirin  81 mg Oral Daily  . atorvastatin  10 mg Oral q1800  . azithromycin  500 mg Intravenous Once  . cefTRIAXone (ROCEPHIN) IVPB 1 gram/50 mL D5W  1 g Intravenous Once  . clopidogrel  75 mg Oral Q breakfast  . dextromethorphan-guaiFENesin  1 tablet Oral BID  . doxazosin  4 mg Oral QHS  . furosemide  60 mg Intravenous Q12H  . furosemide  80 mg Intravenous Once  . hydroxychloroquine  200 mg Oral BID  . ipratropium  0.5 mg Nebulization Once  . levofloxacin (LEVAQUIN) IV  500 mg Intravenous Q24H  . levofloxacin (LEVAQUIN) IV  750 mg Intravenous Q24H  . magnesium sulfate  2 g Intravenous To Major  . methylPREDNISolone (SOLU-MEDROL) injection  125 mg Intravenous Once  . methylPREDNISolone (SOLU-MEDROL) injection  60 mg Intravenous Q12H  . metoprolol  50 mg Oral BID  . potassium chloride  10 mEq Oral Daily  . sodium chloride  3 mL Intravenous Q12H  . warfarin  2.5 mg Oral Q T,Th,S,Su-1800  . warfarin  5 mg Oral Q M,W,F-1800  .  Warfarin - Pharmacist Dosing Inpatient   Does not apply q1800  . DISCONTD: albuterol  2.5 mg Nebulization Once  . DISCONTD: albuterol  2.5 mg Nebulization Q6H  . DISCONTD: albuterol  2.5 mg Nebulization Q6H  . DISCONTD: aspirin  81 mg Oral Daily  . DISCONTD: aspirin  81 mg Oral Daily  . DISCONTD: ipratropium  0.5 mg Nebulization Q6H  . DISCONTD: ipratropium  0.5 mg Nebulization Q4H  . DISCONTD: levofloxacin (LEVAQUIN) IV  750 mg Intravenous Q24H  . DISCONTD: methylPREDNISolone (SOLU-MEDROL) injection  60 mg Intravenous Q6H    Assessment: 78 yo male here with PNA and noted with history of afib on coumadin. The INR is at goal on patients home regimen (patient noted on antibiotics)  Goal of Therapy:  INR 2-3   Plan:  -Continue same -Will consider INR MWF is INR remains stable  Alex Orozco 08/25/2011,10:30 AM

## 2011-08-25 NOTE — Progress Notes (Signed)
   CARE MANAGEMENT NOTE 08/25/2011  Patient:  Alex Orozco, Alex Orozco   Account Number:  [id number]  Date Initiated:  08/25/2011  Documentation initiated by:  Onnie Boer  Subjective/Objective Assessment:   PT WAS ADMITTED WITH PNA     Action/Plan:   PROGRESSION OF CARE AND DISCHARGE PLANNING   Anticipated DC Date:     Anticipated DC Plan:           Choice offered to / List presented to:             Status of service:  In process, will continue to follow Medicare Important Message given?   (If response is "NO", the following Medicare IM given date fields will be blank) Date Medicare IM given:   Date Additional Medicare IM given:    Discharge Disposition:    Per UR Regulation:  Reviewed for med. necessity/level of care/duration of stay  If discussed at Long Length of Stay Meetings, dates discussed:    Comments:  08/25/11 Onnie Boer, RN, BSN [phone] UR COMPLETED

## 2011-08-25 NOTE — Progress Notes (Signed)
PROGRESS NOTE  Alex Orozco ZOX:[id number] DOB: 07-Feb-1930 DOA: 08/24/2011 PCP: Kaleen Mask, MD, MD Pulmonologist: Marcelyn Bruins, MD  Cardiologist: Leodis Sias, MD  Brief narrative: 78 year old man presented to the emergency department with shortness of breath, wheezing and fever. Admitted for pneumonia and CHF.  Past medical history: Coronary artery disease, atrial fibrillation, systolic congestive heart failure, moderate aortic stenosis  Consultants:  None  Procedures:  None  Antibiotics:  April 4: Levaquin  Interim History: Interval documentation reviewed. Rapid response called early this a.m. to evaluate respiratory status. No signs of acute distress noted. Noted to improve with respiratory treatment. Cardiac enzymes negative.  Subjective: Feels much better.  Objective: Filed Vitals:   08/24/11 2356 08/25/11 0142 08/25/11 0152 08/25/11 0639  BP:   136/72 160/77  Pulse:   65 64  Temp:   97.9 F (36.6 C) 97.2 F (36.2 C)  TempSrc:      Resp:   22 22  Height:      Weight:    110.1 kg (242 lb 11.6 oz)  SpO2: 95% 95% 97% 98%    Intake/Output Summary (Last 24 hours) at 08/25/11 0728 Last data filed at 08/25/11 0435  Gross per 24 hour  Intake      0 ml  Output    775 ml  Net   -775 ml    Exam:   General:  Calm and comfortable in appearance.  Cardiovascular: Regular rate and rhythm. No murmur, rub, gallop. No change in lower extremity edema.  Respiratory: Markedly decreased respiratory effort and tachypnea. Improved breath sounds. Decreased wheezes. Some rales.  Psychiatric: Grossly normal mood and affect. Speech fluent and appropriate.  Data Reviewed: Basic Metabolic Panel:  Lab 08/25/11 8119 08/24/11 1613  NA 139 141  K 3.8 3.7  CL 100 102  CO2 28 29  GLUCOSE 145* 102*  BUN 24* 21  CREATININE 2.01* 1.96*  CALCIUM 8.5 9.0  MG 2.4 --  PHOS -- --   CBC:  Lab 08/25/11 0320 08/24/11 1613  WBC 3.9* 5.9  NEUTROABS -- 4.4  HGB 10.9*  11.5*  HCT 32.9* 35.2*  MCV 87.5 87.3  PLT 134* 136*   Cardiac Enzymes:  Lab 08/25/11 0320 08/24/11 2026  CKTOTAL 140 142  CKMB 3.3 2.8  CKMBINDEX -- --  TROPONINI <0.30 <0.30   Studies: Dg Chest 2 View  08/24/2011  *RADIOLOGY REPORT*  Clinical Data: Shortness of breath.  CHEST - 2 VIEW  Comparison: 05/17/2009.  Findings: The heart is enlarged but stable.  Surgical changes from bypass surgery are again noted.  There is central vascular congestion and probable mild perihilar pulmonary edema.  No pleural effusions or pneumothorax.  Stable advanced degenerative changes involving both shoulders.  IMPRESSION: Mild CHF.  Original Report Authenticated By: P. Loralie Champagne, M.D.   Scheduled Meds:   . ipratropium  0.5 mg Nebulization Q6H   And  . albuterol  2.5 mg Nebulization Q6H  . albuterol  5 mg Nebulization Once  . albuterol      . albuterol  10 mg/hr Nebulization Once  . aspirin  81 mg Oral Daily  . atorvastatin  10 mg Oral q1800  . azithromycin  500 mg Intravenous Once  . cefTRIAXone (ROCEPHIN) IVPB 1 gram/50 mL D5W  1 g Intravenous Once  . clopidogrel  75 mg Oral Q breakfast  . dextromethorphan-guaiFENesin  1 tablet Oral BID  . doxazosin  4 mg Oral QHS  . furosemide  60 mg Intravenous Q12H  . furosemide  80 mg Intravenous Once  . hydroxychloroquine  200 mg Oral BID  . ipratropium  0.5 mg Nebulization Once  . levofloxacin (LEVAQUIN) IV  500 mg Intravenous Q24H  . levofloxacin (LEVAQUIN) IV  750 mg Intravenous Q24H  . magnesium sulfate  2 g Intravenous To Major  . methylPREDNISolone (SOLU-MEDROL) injection  125 mg Intravenous Once  . methylPREDNISolone (SOLU-MEDROL) injection  60 mg Intravenous Q6H  . metoprolol  50 mg Oral BID  . potassium chloride  10 mEq Oral Daily  . sodium chloride  3 mL Intravenous Q12H  . warfarin  2.5 mg Oral Q T,Th,S,Su-1800  . warfarin  5 mg Oral Q M,W,F-1800  . Warfarin - Pharmacist Dosing Inpatient   Does not apply q1800  . DISCONTD: albuterol   2.5 mg Nebulization Once  . DISCONTD: aspirin  81 mg Oral Daily  . DISCONTD: aspirin  81 mg Oral Daily  . DISCONTD: levofloxacin (LEVAQUIN) IV  750 mg Intravenous Q24H   Continuous Infusions:    Assessment/Plan: 1. Presumed pneumonia: Continue empiric therapy for community acquired pneumonia. Continue albuterol treatments. Slowly wean steroids. Followup influenza. 2. Systolic congestive heart failure, acute component suspect: Continue IV Lasix today. Change to oral therapy tomorrow. Cardiac enzymes negative. Ejection fraction 35-40%, moderate aortic stenosis by echocardiogram 05/10/2009. 3. Thrombocytopenia: Possibly chronic. Noted in 2010. Appears to be stable. Followup as an outpatient. 4. Chronic kidney disease stage III: Appears to be at baseline. 5. Atrial fibrillation: Appears stable. Continue warfarin and metoprolol. 6. Coronary artery disease: Appears stable. Continue aspirin, Plavix, Crestor.   Code Status: Limited. No CPR. Intubation acceptable.  Family Communication: Discussed with wife and daughter at bedside. All questions answered to their apparent satisfaction.  Disposition Plan: Pending further evaluation and treatment.    Brendia Sacks, MD  [facility] Pager [phone] 7AM-7PM  If 7PM-7AM, please contact night-coverage [email or web address] Password [email or web address] 08/25/2011, 7:28 AM    LOS: 1 day

## 2011-08-25 NOTE — Progress Notes (Signed)
Called by bedside RN for further evaluation of patient's respiratory status. Upon assessment, patient alert and oriented, sitting up in bed with no signs of acute distress. Audible wheezes and crackles heard bilaterally, patient currently receiving respiratory treatment. Post treatment, patient is less short of breath and wheezes/crackles decreased. Patient states has been coughing since Monday and per the bedside RN the patient coughed up blood tinged sputum once. VSS, will continue to monitor and advised bedside RN to call with any changes.

## 2011-08-25 NOTE — Plan of Care (Signed)
Problem: Phase I Progression Outcomes Goal: EF % per last Echo/documented,Core Reminder form on chart Outcome: Completed/Met Date Met:  08/25/11 Last 2-D echocardiogram 05/18/2009, EF 35 - 40%

## 2011-08-25 NOTE — Progress Notes (Signed)
*  PRELIMINARY RESULTS* Echocardiogram 2D Echocardiogram has been performed.  Alex Orozco 08/25/2011, 9:44 AM

## 2011-08-25 NOTE — Progress Notes (Signed)
   CARE MANAGEMENT NOTE 08/25/2011  Patient:  Alex Orozco, Alex Orozco   Account Number:  [id number]  Date Initiated:  08/25/2011  Documentation initiated by:  Onnie Boer  Subjective/Objective Assessment:   PT WAS ADMITTED WITH PNA     Action/Plan:   PROGRESSION OF CARE AND DISCHARGE PLANNING   Anticipated DC Date:  08/28/2011   Anticipated DC Plan:  HOME/SELF CARE      DC Planning Services  CM consult      Choice offered to / List presented to:             Status of service:  In process, will continue to follow Medicare Important Message given?   (If response is "NO", the following Medicare IM given date fields will be blank) Date Medicare IM given:   Date Additional Medicare IM given:    Discharge Disposition:    Per UR Regulation:  Reviewed for med. necessity/level of care/duration of stay  If discussed at Long Length of Stay Meetings, dates discussed:    Comments:  PCP: Dr. Jeannetta Nap  08/25/2011 1500 Darlyne Russian RN,CCM [phone] Met with patient and spouse regarding discharge planning. He denies any DME at home, no O2, nebulizers or inhalers for CHF. A few years ago had home health services, but does not recall the name of the agency. CM to continue to follow for discharge planning needs.  08/25/11 Onnie Boer, RN, BSN [phone] UR COMPLETED

## 2011-08-26 LAB — BASIC METABOLIC PANEL
BUN: 36 mg/dL — ABNORMAL HIGH (ref 6–23)
CO2: 27 mEq/L (ref 19–32)
Chloride: 101 mEq/L (ref 96–112)
Glucose, Bld: 128 mg/dL — ABNORMAL HIGH (ref 70–99)
Potassium: 3.8 mEq/L (ref 3.5–5.1)
Sodium: 140 mEq/L (ref 135–145)

## 2011-08-26 LAB — CBC
HCT: 33.2 % — ABNORMAL LOW (ref 39.0–52.0)
Hemoglobin: 10.8 g/dL — ABNORMAL LOW (ref 13.0–17.0)
RBC: 3.84 MIL/uL — ABNORMAL LOW (ref 4.22–5.81)

## 2011-08-26 LAB — PROTIME-INR
INR: 2.35 — ABNORMAL HIGH (ref 0.00–1.49)
Prothrombin Time: 26.1 seconds — ABNORMAL HIGH (ref 11.6–15.2)

## 2011-08-26 MED ORDER — LEVOFLOXACIN IN D5W 250 MG/50ML IV SOLN
250.0000 mg | INTRAVENOUS | Status: AC
  Administered 2011-08-26 – 2011-08-28 (×3): 250 mg via INTRAVENOUS
  Filled 2011-08-26 (×3): qty 50

## 2011-08-26 MED ORDER — ALBUTEROL SULFATE (5 MG/ML) 0.5% IN NEBU: 2.5000 mg | INHALATION_SOLUTION | RESPIRATORY_TRACT | Status: DC | PRN

## 2011-08-26 MED ORDER — IPRATROPIUM BROMIDE 0.02 % IN SOLN
0.5000 mg | Freq: Three times a day (TID) | RESPIRATORY_TRACT | Status: DC
  Administered 2011-08-27 – 2011-08-29 (×8): 0.5 mg via RESPIRATORY_TRACT
  Filled 2011-08-26 (×7): qty 2.5

## 2011-08-26 MED ORDER — ALBUTEROL SULFATE (5 MG/ML) 0.5% IN NEBU
2.5000 mg | INHALATION_SOLUTION | Freq: Three times a day (TID) | RESPIRATORY_TRACT | Status: DC
  Administered 2011-08-27 – 2011-08-29 (×8): 2.5 mg via RESPIRATORY_TRACT
  Filled 2011-08-26 (×8): qty 0.5

## 2011-08-26 MED ORDER — IPRATROPIUM BROMIDE 0.02 % IN SOLN: 0.5000 mg | Freq: Three times a day (TID) | RESPIRATORY_TRACT | Status: DC

## 2011-08-26 MED ORDER — ALBUTEROL SULFATE (5 MG/ML) 0.5% IN NEBU: 2.5000 mg | INHALATION_SOLUTION | RESPIRATORY_TRACT | Status: DC

## 2011-08-26 NOTE — Progress Notes (Signed)
PROGRESS NOTE  Alex Orozco:[id number] DOB: 1930-04-17 DOA: 08/24/2011 PCP: Kaleen Mask, MD, MD Pulmonologist: Marcelyn Bruins, MD  Cardiologist: Leodis Sias, MD  Brief narrative: 78 year old man presented to the emergency department with shortness of breath, wheezing and fever. Admitted for pneumonia and CHF.  Past medical history: Coronary artery disease, atrial fibrillation, systolic congestive heart failure, moderate aortic stenosis  Consultants:  None  Procedures:  None  Antibiotics:  April 4: Levaquin  Interim History: Interval documentation reviewed. Afebrile, vital signs stable. Still requires oxygen.  Subjective: Overall feels better but continues to wheeze.  Objective: Filed Vitals:   08/26/11 0646 08/26/11 0806 08/26/11 0930 08/26/11 0951  BP: 162/77  160/78 158/78  Pulse: 59  96 58  Temp: 98.5 F (36.9 C)  98.6 F (37 C)   TempSrc: Oral  Oral   Resp: 22  22   Height:      Weight:      SpO2: 99% 97% 97%     Intake/Output Summary (Last 24 hours) at 08/26/11 1104 Last data filed at 08/26/11 1000  Gross per 24 hour  Intake   1276 ml  Output   1776 ml  Net   -500 ml    Exam:   General: Calm and comfortable in appearance.  Cardiovascular: Regular rate and rhythm. No murmur, rub, gallop. No change in lower extremity edema.  Respiratory: More wheezing today and some mild tachypnea. However respiratory effort remains decreased compared to admission. Fair movement. No rhonchi or rales appreciated.  Psychiatric: Grossly normal mood and affect. Speech fluent and appropriate.  Data Reviewed: Basic Metabolic Panel:  Lab 08/26/11 8119 08/25/11 0320 08/24/11 1613  NA 140 139 141  K 3.8 3.8 --  CL 101 100 102  CO2 27 28 29   GLUCOSE 128* 145* 102*  BUN 36* 24* 21  CREATININE 1.84* 2.01* 1.96*  CALCIUM 8.4 8.5 9.0  MG -- 2.4 --  PHOS -- -- --   CBC:  Lab 08/26/11 0630 08/25/11 0320 08/24/11 1613  WBC 13.1* 3.9* 5.9  NEUTROABS --  -- 4.4  HGB 10.8* 10.9* 11.5*  HCT 33.2* 32.9* 35.2*  MCV 86.5 87.5 87.3  PLT 146* 134* 136*   Cardiac Enzymes:  Lab 08/25/11 1146 08/25/11 0320 08/24/11 2026  CKTOTAL 147 140 142  CKMB 4.3* 3.3 2.8  CKMBINDEX -- -- --  TROPONINI <0.30 <0.30 <0.30   Studies: Dg Chest 2 View  08/24/2011  *RADIOLOGY REPORT*  Clinical Data: Shortness of breath.  CHEST - 2 VIEW  Comparison: 05/17/2009.  Findings: The heart is enlarged but stable.  Surgical changes from bypass surgery are again noted.  There is central vascular congestion and probable mild perihilar pulmonary edema.  No pleural effusions or pneumothorax.  Stable advanced degenerative changes involving both shoulders.  IMPRESSION: Mild CHF.  Original Report Authenticated By: P. Loralie Champagne, M.D.   Scheduled Meds:    . albuterol  2.5 mg Nebulization Q4H   And  . ipratropium  0.5 mg Nebulization Q4H  . aspirin  81 mg Oral Daily  . atorvastatin  10 mg Oral q1800  . clopidogrel  75 mg Oral Q breakfast  . dextromethorphan-guaiFENesin  1 tablet Oral BID  . doxazosin  4 mg Oral QHS  . furosemide  60 mg Intravenous Q12H  . hydroxychloroquine  200 mg Oral BID  . levofloxacin (LEVAQUIN) IV  500 mg Intravenous Q24H  . methylPREDNISolone (SOLU-MEDROL) injection  60 mg Intravenous Q12H  . metoprolol  50 mg Oral BID  .  potassium chloride  10 mEq Oral Daily  . sodium chloride  3 mL Intravenous Q12H  . warfarin  2.5 mg Oral Q T,Th,S,Su-1800  . warfarin  5 mg Oral Q M,W,F-1800  . Warfarin - Pharmacist Dosing Inpatient   Does not apply q1800   Continuous Infusions:    Assessment/Plan: 1. Presumed pneumonia: Continue empiric therapy for community acquired pneumonia. Continue albuterol treatments. Slowly wean steroids. Influenza negative.  2. Systolic congestive heart failure, acute component suspect: Continue IV Lasix today. Cardiac enzymes negative. Ejection fraction 35-40%, moderate aortic stenosis by echocardiogram  05/10/2009. 3. Thrombocytopenia: Stable. Noted in 2010. Followup as an outpatient. 4. Chronic kidney disease stage III: Appears to be at baseline. 5. Atrial fibrillation: Appears stable. Continue warfarin and metoprolol. 6. Coronary artery disease: Appears stable. Continue aspirin, Plavix, Crestor.  Now desires to be full code.  Code Status: Full code. Family Communication: Discussed with wife at bedside. All questions answered to her apparent satisfaction.  Disposition Plan: Home when improved.    Brendia Sacks, MD  [facility] Pager [phone] 7AM-7PM  If 7PM-7AM, please contact night-coverage [email or web address] Password [email or web address] 08/26/2011, 11:04 AM    LOS: 2 days

## 2011-08-26 NOTE — Progress Notes (Signed)
ANTICOAGULATION CONSULT NOTE - Follow Up Consult  Pharmacy Consult for coumadin Indication: atrial fibrillation  No Known Allergies  Patient Measurements: Height: 5\' 8"  (172.7 cm) Weight: 244 lb 1.6 oz (110.723 kg) (Scale A) IBW/kg (Calculated) : 68.4    Vital Signs: Temp: 98.5 F (36.9 C) (04/06 0646) Temp src: Oral (04/06 0646) BP: 158/78 mmHg (04/06 0951) Pulse Rate: 58  (04/06 0951)  Labs:  Basename 08/26/11 0630 08/25/11 1146 08/25/11 0320 08/24/11 2026 08/24/11 1613  HGB 10.8* -- 10.9* -- --  HCT 33.2* -- 32.9* -- 35.2*  PLT 146* -- 134* -- 136*  APTT -- -- -- -- --  LABPROT 26.1* -- 25.1* -- 24.6*  INR 2.35* -- 2.23* -- 2.18*  HEPARINUNFRC -- -- -- -- --  CREATININE 1.84* -- 2.01* -- 1.96*  CKTOTAL -- 147 140 142 --  CKMB -- 4.3* 3.3 2.8 --  TROPONINI -- <0.30 <0.30 <0.30 --   Estimated Creatinine Clearance: 39.3 ml/min (by C-G formula based on Cr of 1.84).   Medications:  Scheduled:     . albuterol  2.5 mg Nebulization Q4H   And  . ipratropium  0.5 mg Nebulization Q4H  . aspirin  81 mg Oral Daily  . atorvastatin  10 mg Oral q1800  . clopidogrel  75 mg Oral Q breakfast  . dextromethorphan-guaiFENesin  1 tablet Oral BID  . doxazosin  4 mg Oral QHS  . furosemide  60 mg Intravenous Q12H  . hydroxychloroquine  200 mg Oral BID  . levofloxacin (LEVAQUIN) IV  500 mg Intravenous Q24H  . methylPREDNISolone (SOLU-MEDROL) injection  60 mg Intravenous Q12H  . metoprolol  50 mg Oral BID  . potassium chloride  10 mEq Oral Daily  . sodium chloride  3 mL Intravenous Q12H  . warfarin  2.5 mg Oral Q T,Th,S,Su-1800  . warfarin  5 mg Oral Q M,W,F-1800  . Warfarin - Pharmacist Dosing Inpatient   Does not apply q1800    Assessment: 78 yo male here with PNA, on coumadin PTA for hx afib (home dose: 2.5mg  T/T/S/S, 5mg  MWF). INR therapeutic on patients home regimen.  Pt also on abx - will monitor INR closely. Hgb, plts low but stable. No bleeding noted.    Goal of  Therapy:  INR 2-3   Plan:  -Continue same home dose coumadin.   -Will consider INR MWF if INR remains stable  Aija Scarfo E 08/26/2011,10:14 AM

## 2011-08-27 LAB — PROTIME-INR: INR: 3.06 — ABNORMAL HIGH (ref 0.00–1.49)

## 2011-08-27 MED ORDER — HYDRALAZINE HCL 20 MG/ML IJ SOLN
10.0000 mg | Freq: Four times a day (QID) | INTRAMUSCULAR | Status: DC | PRN
  Administered 2011-08-28 (×2): 10 mg via INTRAVENOUS
  Filled 2011-08-27 (×4): qty 0.5

## 2011-08-27 NOTE — Progress Notes (Signed)
ANTICOAGULATION CONSULT NOTE - Follow Up Consult  Pharmacy Consult for coumadin Indication: atrial fibrillation  No Known Allergies  Patient Measurements: Height: 5\' 8"  (172.7 cm) Weight: 243 lb 8 oz (110.451 kg) (Scale A) IBW/kg (Calculated) : 68.4    Vital Signs: Temp: 98 F (36.7 C) (04/07 0527) Temp src: Oral (04/07 0527) BP: 166/79 mmHg (04/07 0527) Pulse Rate: 62  (04/07 0527)  Labs:  Basename 08/27/11 0557 08/26/11 0630 08/25/11 1146 08/25/11 0320 08/24/11 2026 08/24/11 1613  HGB -- 10.8* -- 10.9* -- --  HCT -- 33.2* -- 32.9* -- 35.2*  PLT -- 146* -- 134* -- 136*  APTT -- -- -- -- -- --  LABPROT 32.1* 26.1* -- 25.1* -- --  INR 3.06* 2.35* -- 2.23* -- --  HEPARINUNFRC -- -- -- -- -- --  CREATININE -- 1.84* -- 2.01* -- 1.96*  CKTOTAL -- -- 147 140 142 --  CKMB -- -- 4.3* 3.3 2.8 --  TROPONINI -- -- <0.30 <0.30 <0.30 --   Estimated Creatinine Clearance: 39.2 ml/min (by C-G formula based on Cr of 1.84).   Medications:  Scheduled:     . albuterol  2.5 mg Nebulization TID   And  . ipratropium  0.5 mg Nebulization TID  . aspirin  81 mg Oral Daily  . atorvastatin  10 mg Oral q1800  . clopidogrel  75 mg Oral Q breakfast  . dextromethorphan-guaiFENesin  1 tablet Oral BID  . doxazosin  4 mg Oral QHS  . furosemide  60 mg Intravenous Q12H  . hydroxychloroquine  200 mg Oral BID  . levofloxacin (LEVAQUIN) IV  250 mg Intravenous Q24H  . methylPREDNISolone (SOLU-MEDROL) injection  60 mg Intravenous Q12H  . metoprolol  50 mg Oral BID  . potassium chloride  10 mEq Oral Daily  . sodium chloride  3 mL Intravenous Q12H  . warfarin  2.5 mg Oral Q T,Th,S,Su-1800  . Warfarin - Pharmacist Dosing Inpatient   Does not apply q1800  . DISCONTD: albuterol  2.5 mg Nebulization Q4H  . DISCONTD: albuterol  2.5 mg Nebulization Q4H  . DISCONTD: ipratropium  0.5 mg Nebulization Q4H  . DISCONTD: ipratropium  0.5 mg Nebulization TID  . DISCONTD: levofloxacin (LEVAQUIN) IV  500 mg  Intravenous Q24H    Assessment: 78 yo male here with PNA, on coumadin PTA for hx afib (home dose: 2.5mg  T/T/S/S, 5mg  MWF). Pt has been receiving home dose schedule while in hospital - today INR slightly elevated 3.06.  Pt also on abx - will monitor INR closely. Hgb, plts low but stable. No bleeding noted.    Goal of Therapy:  INR 2-3   Plan:  -Continue same home dose coumadin.   -Will consider INR MWF if INR remains stable  Hartley Urton E 08/27/2011,8:20 AM

## 2011-08-27 NOTE — Progress Notes (Signed)
PROGRESS NOTE  Alex Orozco ZOX:[id number] DOB: August 15, 1929 DOA: 08/24/2011 PCP: Kaleen Mask, MD, MD Pulmonologist: Marcelyn Bruins, MD  Cardiologist: Leodis Sias, MD  Brief narrative: 78 year old man presented to the emergency department with shortness of breath, wheezing and fever. Admitted for pneumonia and CHF.  Past medical history: Coronary artery disease, atrial fibrillation, systolic congestive heart failure, moderate aortic stenosis  Consultants:  None  Procedures:  None  Antibiotics:  April 4: Levaquin  Interim History: Interval documentation reviewed. Afebrile, vital signs stable. Still requires oxygen.  Subjective: Slowly improving. Still wheezing.  Objective: Filed Vitals:   08/26/11 2241 08/27/11 0527 08/27/11 0735 08/27/11 0851  BP: 153/71 166/79  147/54  Pulse: 59 62  67  Temp:  98 F (36.7 C)  98.4 F (36.9 C)  TempSrc:  Oral  Oral  Resp: 21 25  24   Height:      Weight:  110.451 kg (243 lb 8 oz)    SpO2:  97% 98% 99%    Intake/Output Summary (Last 24 hours) at 08/27/11 1028 Last data filed at 08/27/11 0900  Gross per 24 hour  Intake   1600 ml  Output   1950 ml  Net   -350 ml    Exam:   General: Calm and comfortable in appearance.  Cardiovascular: Regular rate and rhythm. No murmur, rub, gallop. No change in lower extremity edema.  Respiratory: Less wheezing today and the kidney appears resolved. Improved air movement.   Psychiatric: Grossly normal mood and affect. Speech fluent and appropriate.  Data Reviewed: Basic Metabolic Panel:  Lab 08/26/11 8119 08/25/11 0320 08/24/11 1613  NA 140 139 141  K 3.8 3.8 --  CL 101 100 102  CO2 27 28 29   GLUCOSE 128* 145* 102*  BUN 36* 24* 21  CREATININE 1.84* 2.01* 1.96*  CALCIUM 8.4 8.5 9.0  MG -- 2.4 --  PHOS -- -- --   CBC:  Lab 08/26/11 0630 08/25/11 0320 08/24/11 1613  WBC 13.1* 3.9* 5.9  NEUTROABS -- -- 4.4  HGB 10.8* 10.9* 11.5*  HCT 33.2* 32.9* 35.2*  MCV 86.5 87.5  87.3  PLT 146* 134* 136*   Cardiac Enzymes:  Lab 08/25/11 1146 08/25/11 0320 08/24/11 2026  CKTOTAL 147 140 142  CKMB 4.3* 3.3 2.8  CKMBINDEX -- -- --  TROPONINI <0.30 <0.30 <0.30   Studies: Dg Chest 2 View  08/24/2011  *RADIOLOGY REPORT*  Clinical Data: Shortness of breath.  CHEST - 2 VIEW  Comparison: 05/17/2009.  Findings: The heart is enlarged but stable.  Surgical changes from bypass surgery are again noted.  There is central vascular congestion and probable mild perihilar pulmonary edema.  No pleural effusions or pneumothorax.  Stable advanced degenerative changes involving both shoulders.  IMPRESSION: Mild CHF.  Original Report Authenticated By: P. Loralie Champagne, M.D.   Scheduled Meds:    . albuterol  2.5 mg Nebulization TID   And  . ipratropium  0.5 mg Nebulization TID  . aspirin  81 mg Oral Daily  . atorvastatin  10 mg Oral q1800  . clopidogrel  75 mg Oral Q breakfast  . dextromethorphan-guaiFENesin  1 tablet Oral BID  . doxazosin  4 mg Oral QHS  . furosemide  60 mg Intravenous Q12H  . hydroxychloroquine  200 mg Oral BID  . levofloxacin (LEVAQUIN) IV  250 mg Intravenous Q24H  . methylPREDNISolone (SOLU-MEDROL) injection  60 mg Intravenous Q12H  . metoprolol  50 mg Oral BID  . potassium chloride  10 mEq Oral  Daily  . sodium chloride  3 mL Intravenous Q12H  . warfarin  2.5 mg Oral Q T,Th,S,Su-1800  . Warfarin - Pharmacist Dosing Inpatient   Does not apply q1800  . DISCONTD: albuterol  2.5 mg Nebulization Q4H  . DISCONTD: albuterol  2.5 mg Nebulization Q4H  . DISCONTD: ipratropium  0.5 mg Nebulization Q4H  . DISCONTD: ipratropium  0.5 mg Nebulization TID  . DISCONTD: levofloxacin (LEVAQUIN) IV  500 mg Intravenous Q24H   Continuous Infusions:    Assessment/Plan: 1. Presumed pneumonia: Continue empiric therapy for community acquired pneumonia. Continue albuterol treatments. Slowly wean steroids. Influenza negative.  2. Systolic congestive heart failure, acute  component suspect: Continue IV Lasix today. Cardiac enzymes negative. Ejection fraction 35-40%, moderate aortic stenosis by echocardiogram 05/10/2009. 3. Thrombocytopenia: Stable. Noted in 2010. Followup as an outpatient. 4. Chronic kidney disease stage III: Appears to be at baseline. 5. Atrial fibrillation: Appears stable. Continue warfarin and metoprolol. 6. Coronary artery disease: Appears stable. Continue aspirin, Plavix, Crestor.   Code Status: Full code. Family Communication: Discussed with wife at bedside. All questions answered to her apparent satisfaction.  Disposition Plan: Home when improved.    Brendia Sacks, MD  [facility] Pager [phone] 7AM-7PM  If 7PM-7AM, please contact night-coverage [email or web address] Password [email or web address] 08/27/2011, 10:28 AM    LOS: 3 days

## 2011-08-28 ENCOUNTER — Other Ambulatory Visit: Payer: Self-pay

## 2011-08-28 LAB — PROTIME-INR
INR: 3.43 — ABNORMAL HIGH (ref 0.00–1.49)
Prothrombin Time: 35.1 seconds — ABNORMAL HIGH (ref 11.6–15.2)

## 2011-08-28 MED ORDER — METHYLPREDNISOLONE SODIUM SUCC 125 MG IJ SOLR
60.0000 mg | INTRAMUSCULAR | Status: DC
  Administered 2011-08-29: 60 mg via INTRAVENOUS
  Filled 2011-08-28: qty 0.96

## 2011-08-28 MED ORDER — METOPROLOL TARTRATE 50 MG PO TABS
50.0000 mg | ORAL_TABLET | Freq: Two times a day (BID) | ORAL | Status: DC
  Administered 2011-08-28 – 2011-08-29 (×2): 50 mg via ORAL
  Filled 2011-08-28 (×3): qty 1

## 2011-08-28 NOTE — ED Provider Notes (Signed)
I saw and evaluated the patient, reviewed the resident's note and I agree with the findings and plan, ekg interpretation (reviewed by me)   Diffuse exp wheezing. Recent fever/coughing.  CXR ? Edema. No infiltrate. Given fever will cover for pna. Admit for diuresis and IV abx, further w/u and evaluation.  Forbes Cellar, MD 08/28/11 [phone]

## 2011-08-28 NOTE — Progress Notes (Signed)
PROGRESS NOTE  CLETIS CLACK AVW:[id number] DOB: 1929-09-11 DOA: 08/24/2011 PCP: Kaleen Mask, MD, MD Pulmonologist: Marcelyn Bruins, MD  Cardiologist: Leodis Sias, MD  Brief narrative: 78 year old man presented to the emergency department with shortness of breath, wheezing and fever. Admitted for pneumonia and CHF.  Past medical history: Coronary artery disease, atrial fibrillation, systolic congestive heart failure, moderate aortic stenosis  Consultants:  None  Procedures:  None  Antibiotics:  April 4: Levaquin  Interim History: Interval documentation reviewed. Afebrile, vital signs stable. Still on oxygen.  Subjective: Continues to improve.  Objective: Filed Vitals:   08/28/11 0157 08/28/11 0444 08/28/11 0858 08/28/11 1025  BP: 162/78 161/74  185/79  Pulse:  65  66  Temp:  98.6 F (37 C)  97.8 F (36.6 C)  TempSrc:  Oral  Oral  Resp:  22  20  Height:      Weight:  110.859 kg (244 lb 6.4 oz)    SpO2:  98% 97% 99%    Intake/Output Summary (Last 24 hours) at 08/28/11 1030 Last data filed at 08/28/11 0900  Gross per 24 hour  Intake   1000 ml  Output   1650 ml  Net   -650 ml    Exam:   General: Calm and comfortable in appearance.  Cardiovascular: Regular rate and rhythm. No murmur, rub, gallop. No change in lower extremity edema.  Respiratory: Less wheezing today, improved air movement. No tachypnea.  Psychiatric: Grossly normal mood and affect. Speech fluent and appropriate.  Data Reviewed: Basic Metabolic Panel:  Lab 08/26/11 8295 08/25/11 0320 08/24/11 1613  NA 140 139 141  K 3.8 3.8 --  CL 101 100 102  CO2 27 28 29   GLUCOSE 128* 145* 102*  BUN 36* 24* 21  CREATININE 1.84* 2.01* 1.96*  CALCIUM 8.4 8.5 9.0  MG -- 2.4 --  PHOS -- -- --   CBC:  Lab 08/26/11 0630 08/25/11 0320 08/24/11 1613  WBC 13.1* 3.9* 5.9  NEUTROABS -- -- 4.4  HGB 10.8* 10.9* 11.5*  HCT 33.2* 32.9* 35.2*  MCV 86.5 87.5 87.3  PLT 146* 134* 136*   Cardiac  Enzymes:  Lab 08/25/11 1146 08/25/11 0320 08/24/11 2026  CKTOTAL 147 140 142  CKMB 4.3* 3.3 2.8  CKMBINDEX -- -- --  TROPONINI <0.30 <0.30 <0.30   Studies: Dg Chest 2 View  08/24/2011  *RADIOLOGY REPORT*  Clinical Data: Shortness of breath.  CHEST - 2 VIEW  Comparison: 05/17/2009.  Findings: The heart is enlarged but stable.  Surgical changes from bypass surgery are again noted.  There is central vascular congestion and probable mild perihilar pulmonary edema.  No pleural effusions or pneumothorax.  Stable advanced degenerative changes involving both shoulders.  IMPRESSION: Mild CHF.  Original Report Authenticated By: P. Loralie Champagne, M.D.   Scheduled Meds:    . albuterol  2.5 mg Nebulization TID   And  . ipratropium  0.5 mg Nebulization TID  . aspirin  81 mg Oral Daily  . atorvastatin  10 mg Oral q1800  . clopidogrel  75 mg Oral Q breakfast  . dextromethorphan-guaiFENesin  1 tablet Oral BID  . doxazosin  4 mg Oral QHS  . furosemide  60 mg Intravenous Q12H  . hydroxychloroquine  200 mg Oral BID  . levofloxacin (LEVAQUIN) IV  250 mg Intravenous Q24H  . methylPREDNISolone (SOLU-MEDROL) injection  60 mg Intravenous Q12H  . metoprolol  50 mg Oral BID  . potassium chloride  10 mEq Oral Daily  . sodium chloride  3 mL Intravenous Q12H  . warfarin  2.5 mg Oral Q T,Th,S,Su-1800  . Warfarin - Pharmacist Dosing Inpatient   Does not apply q1800   Continuous Infusions:    Assessment/Plan: 1. Presumed pneumonia: Continues to improve. Continue empiric therapy for community acquired pneumonia. Continue albuterol treatments. Slowly wean steroids. Influenza negative.  2. Systolic congestive heart failure, acute component suspect: Change to oral Lasix today. Cardiac enzymes negative. Ejection fraction 35-40%, moderate aortic stenosis by echocardiogram 05/10/2009. 3. Thrombocytopenia: Stable. Noted in 2010. Followup as an outpatient. 4. Chronic kidney disease stage III: Appears to be at  baseline. 5. Atrial fibrillation: Appears stable. Continue warfarin and metoprolol. 6. Coronary artery disease: Appears stable. Continue aspirin, Plavix, Crestor.  Blood pressure high Wean oxygen   Code Status: Full code. Family Communication: Discussed with wife at bedside. All questions answered to her apparent satisfaction.  Disposition Plan: Home when improved.    Brendia Sacks, MD  [facility] Pager [phone] 7AM-7PM  If 7PM-7AM, please contact night-coverage [email or web address] Password [email or web address] 08/28/2011, 10:30 AM    LOS: 4 days

## 2011-08-28 NOTE — Progress Notes (Signed)
ANTICOAGULATION CONSULT NOTE - Follow Up Consult  Pharmacy Consult for coumadin Indication: atrial fibrillation  Assessment: 78 yo male here with PNA, on coumadin PTA for hx afib (home dose: 2.5mg  T/T/S/S, 5mg  MWF). Pt has been receiving home dose schedule while in hospital - today INR still trending higher and is now above desired goal range.  Pt receiving IV Levaquin for presumed pneumonia which can interact with Warfarin and cause elevation in INR.  He has no noted bleeding complications.     Goal of Therapy:  INR 2-3   Plan:  - Hold his Warfarin today.   - F/U his am labs and adjust dose accordingly.  Nadara Mustard, PharmD., MS Clinical Pharmacist Pager: [phone] 08/28/2011,10:51 AM  No Known Allergies  Patient Measurements: Height: 5\' 8"  (172.7 cm) Weight: 244 lb 6.4 oz (110.859 kg) (Scale A) IBW/kg (Calculated) : 68.4    Vital Signs: Temp: 97.8 F (36.6 C) (04/08 1025) Temp src: Oral (04/08 1025) BP: 182/78 mmHg (04/08 1034) Pulse Rate: 66  (04/08 1025)  Labs:  Basename 08/28/11 0630 08/27/11 0557 08/26/11 0630 08/25/11 1146  HGB -- -- 10.8* --  HCT -- -- 33.2* --  PLT -- -- 146* --  APTT -- -- -- --  LABPROT 35.1* 32.1* 26.1* --  INR 3.43* 3.06* 2.35* --  HEPARINUNFRC -- -- -- --  CREATININE -- -- 1.84* --  CKTOTAL -- -- -- 147  CKMB -- -- -- 4.3*  TROPONINI -- -- -- <0.30   Estimated Creatinine Clearance: 39.3 ml/min (by C-G formula based on Cr of 1.84).   Medications:  Scheduled:     . albuterol  2.5 mg Nebulization TID   And  . ipratropium  0.5 mg Nebulization TID  . aspirin  81 mg Oral Daily  . atorvastatin  10 mg Oral q1800  . clopidogrel  75 mg Oral Q breakfast  . dextromethorphan-guaiFENesin  1 tablet Oral BID  . doxazosin  4 mg Oral QHS  . furosemide  60 mg Intravenous Q12H  . hydroxychloroquine  200 mg Oral BID  . levofloxacin (LEVAQUIN) IV  250 mg Intravenous Q24H  . methylPREDNISolone (SOLU-MEDROL) injection  60 mg Intravenous Q12H    . metoprolol  50 mg Oral BID  . potassium chloride  10 mEq Oral Daily  . sodium chloride  3 mL Intravenous Q12H  . warfarin  2.5 mg Oral Q T,Th,S,Su-1800  . Warfarin - Pharmacist Dosing Inpatient   Does not apply [phone]

## 2011-08-28 NOTE — Progress Notes (Signed)
   CARE MANAGEMENT NOTE 08/28/2011  Patient:  Alex Orozco, Alex Orozco   Account Number:  [id number]  Date Initiated:  08/25/2011  Documentation initiated by:  Onnie Boer  Subjective/Objective Assessment:   PT WAS ADMITTED WITH PNA     Action/Plan:   PROGRESSION OF CARE AND DISCHARGE PLANNING   Anticipated DC Date:  08/28/2011   Anticipated DC Plan:  HOME/SELF CARE      DC Planning Services  CM consult      Choice offered to / List presented to:             Status of service:  In process, will continue to follow Medicare Important Message given?   (If response is "NO", the following Medicare IM given date fields will be blank) Date Medicare IM given:   Date Additional Medicare IM given:    Discharge Disposition:    Per UR Regulation:  Reviewed for med. necessity/level of care/duration of stay  If discussed at Long Length of Stay Meetings, dates discussed:    Comments:  PCP: Dr. Jeannetta Nap  08/28/2011 [address] RN, [place] [phone] [facility]/ Tim  [phone]  left voice mail message regarding referral.  08/25/2011 [address] RN,CCM [phone] Met with patient and spouse regarding discharge planning. He denies any DME at home, no O2, nebulizers or inhalers for CHF. A few years ago had home health services, but does not recall the name of the agency. CM to continue to follow for discharge planning needs.  08/25/11 Onnie Boer, RN, BSN [phone] UR COMPLETED

## 2011-08-29 LAB — PROTIME-INR
INR: 3.8 — ABNORMAL HIGH (ref 0.00–1.49)
Prothrombin Time: 38 seconds — ABNORMAL HIGH (ref 11.6–15.2)

## 2011-08-29 MED ORDER — PREDNISONE 10 MG PO TABS: ORAL_TABLET | ORAL | Status: DC

## 2011-08-29 MED ORDER — LEVOFLOXACIN 250 MG PO TABS
250.0000 mg | ORAL_TABLET | ORAL | Status: DC
  Filled 2011-08-29: qty 1

## 2011-08-29 MED ORDER — LEVOFLOXACIN 250 MG PO TABS: 250.0000 mg | ORAL_TABLET | Freq: Every day | ORAL | Status: DC

## 2011-08-29 MED ORDER — ALBUTEROL SULFATE HFA 108 (90 BASE) MCG/ACT IN AERS: 2.0000 | INHALATION_SPRAY | Freq: Four times a day (QID) | RESPIRATORY_TRACT | Status: DC | PRN

## 2011-08-29 NOTE — Discharge Summary (Signed)
Physician Discharge Summary  Alex Orozco ZOX:[id number] DOB: 1929/08/22 DOA: 08/24/2011  PCP: Alex Mask, MD, Orozco Pulmonologist: Alex Bruins, Orozco  Cardiologist: Alex Sias, Orozco  Admit date: 08/24/2011 Discharge date: 08/29/2011  Discharge Diagnoses:  1. Presumed pneumonia 2. Acute systolic congestive heart failure 3. Atrial fibrillation 4. Slightly elevated INR 5. Chronic kidney disease stage III stable 6. Chronic thrombocytopenia, stable  Discharge Condition: Improved  Disposition: Home  History of present illness:  78 year old man presented to the emergency department with shortness of breath, wheezing and fever. Admitted for pneumonia and CHF.  Hospital Course:  Alex Orozco was admitted to the medical floor and treated with antibiotics, oxygen, steroids and nebulizer treatments for presumed pneumonia with significant bronchospasm. He was also treated with IV Lasix for a mild component of acute systolic congestive heart failure. His condition improved throughout the course of his hospitalization he appears to be stable for discharge at this point. His Norvasc was discontinued given his chronic lower extremity edema. INR slightly elevated probably secondary to antibiotics. I have scheduled him for Coumadin clinic appointment tomorrow for close followup. These and other issues as outlined below. 1. Presumed pneumonia: Continues to improve. Discharge home on oral antibiotics (last day April 11), albuterol MDI as needed and oral steroid taper. Wean oxygen. Influenza negative.    2. Systolic congestive heart failure, acute component suspected: Compensated. Continue oral Lasix, metoprolol. No ACE inhibitor based on kidney function for now but could be considered in the outpatient setting.Discontinue Norvasc secondary to chronic lower extremity edema.  Cardiac enzymes negative. Ejection fraction 35-40%, moderate aortic stenosis by echocardiogram 05/10/2009.  3. Thrombocytopenia: Stable.  Noted in 2010. Followup as an outpatient.  4. Chronic kidney disease stage III: Appears to be at baseline.  5. Atrial fibrillation: Appears stable.  INR elevated secondary to interaction with antibiotics. No warfarin. Close followup of INR as outpatient. Continue metoprolol.  6. Coronary artery disease: Appears stable. Continue aspirin, Plavix, Crestor.  Consultants:  None  Procedures:  None  Antibiotics:  April 4-11: Levaquin  Discharge Instructions  Discharge Orders    Future Appointments: Provider: Department: Dept Phone: Center:   08/30/2011 10:30 AM [facility] [phone] None   06/19/2012 11:00 AM Alex Orozco [facility] [phone] None     Future Orders Please Complete By Expires   Diet - low sodium heart healthy      Activity as tolerated - No restrictions      (HEART FAILURE PATIENTS) Call Orozco:  Anytime you have any of the following symptoms: 1) 3 pound weight gain in 24 hours or 5 pounds in 1 week 2) shortness of breath, with or without a dry hacking cough 3) swelling in the hands, feet or stomach 4) if you have to sleep on extra pillows at night in order to breathe.        Medication List  As of 08/29/2011 10:34 AM   STOP taking these medications         amLODipine 2.5 MG tablet      cefUROXime 500 MG tablet      IRON PO      warfarin 5 MG tablet         TAKE these medications         acetaminophen 500 MG tablet   Commonly known as: TYLENOL   Take 500 mg by mouth every 6 (six) hours as needed. For pain      albuterol 108 (90 BASE) MCG/ACT inhaler  Commonly known as: PROVENTIL HFA;VENTOLIN HFA   Inhale 2 puffs into the lungs every 6 (six) hours as needed for wheezing.      aspirin 81 MG tablet   Take 81 mg by mouth daily.      clopidogrel 75 MG tablet   Commonly known as: PLAVIX   TAKE 1 TABLET BY MOUTH EVERY DAY      CRESTOR 10 MG tablet   Generic drug: rosuvastatin   TAKE 1 TABELT BY MOUTH EVERY DAY        doxazosin 4 MG tablet   Commonly known as: CARDURA   Take 1 tablet (4 mg total) by mouth at bedtime.      famotidine 10 MG tablet   Commonly known as: PEPCID   Take 10 mg by mouth 2 (two) times daily as needed. For acid      fish oil-omega-3 fatty acids 1000 MG capsule   Take 1 g by mouth daily.      furosemide 40 MG tablet   Commonly known as: LASIX   Take 40 mg by mouth 2 (two) times daily.      hydroxychloroquine 200 MG tablet   Commonly known as: PLAQUENIL   Take 200 mg by mouth 2 (two) times daily.      Iron 240 (27 FE) MG Tabs   Take 1 tablet by mouth daily.      levofloxacin 250 MG tablet   Commonly known as: LEVAQUIN   Take 1 tablet (250 mg total) by mouth daily. First dose this evening April 9.      metoprolol 50 MG tablet   Commonly known as: LOPRESSOR   Take 1 tablet (50 mg total) by mouth 2 (two) times daily.      potassium chloride 10 MEQ tablet   Commonly known as: K-DUR   Take 10 mEq by mouth daily.      predniSONE 10 MG tablet   Commonly known as: DELTASONE   April 9-April 11: 40 mg po daily. April 12-14: 20 mg po daily. April 15-17: 10 mg po daily.      traMADol 50 MG tablet   Commonly known as: ULTRAM   Take 50 mg by mouth every 6 (six) hours as needed. For pain      Vitamin D-3 5000 UNITS Tabs   Take 1 tablet by mouth daily.           Follow-up Information    Follow up with Alex Mask, Orozco in 1 week. (for PT/INR)       Follow up with Alex Aquas., Orozco on 08/30/2011. (10:30 AM for PT/INR check only)    Contact information:   [address], [address] [place] [address] [phone]          The results of significant diagnostics from this hospitalization (including imaging, microbiology, ancillary and laboratory) are listed below for reference.    Significant Diagnostic Studies: Dg Chest 2 View  08/24/2011  *RADIOLOGY REPORT*  Clinical Data: Shortness of breath.  CHEST - 2 VIEW  Comparison:  05/17/2009.  Findings: The heart is enlarged but stable.  Surgical changes from bypass surgery are again noted.  There is central vascular congestion and probable mild perihilar pulmonary edema.  No pleural effusions or pneumothorax.  Stable advanced degenerative changes involving both shoulders.  IMPRESSION: Mild CHF.  Original Report Authenticated By: P. Loralie Champagne, M.D.   Microbiology: Recent Results (from the past 240 hour(s))  CULTURE, BLOOD (ROUTINE X 2)  Status: Normal (Preliminary result)   Collection Time   08/24/11  4:25 PM      Component Value Range Status Comment   Specimen Description BLOOD ARM LEFT   Final    Special Requests BOTTLES DRAWN AEROBIC ONLY 10CC   Final    Culture  Setup Time [id number]   Final    Culture     Final    Value:        BLOOD CULTURE RECEIVED NO GROWTH TO DATE CULTURE WILL BE HELD FOR 5 DAYS BEFORE ISSUING A FINAL NEGATIVE REPORT   Report Status PENDING   Incomplete   CULTURE, BLOOD (ROUTINE X 2)     Status: Normal (Preliminary result)   Collection Time   08/24/11  4:30 PM      Component Value Range Status Comment   Specimen Description BLOOD ARM RIGHT   Final    Special Requests BOTTLES DRAWN AEROBIC ONLY 5CC   Final    Culture  Setup Time [id number]   Final    Culture     Final    Value:        BLOOD CULTURE RECEIVED NO GROWTH TO DATE CULTURE WILL BE HELD FOR 5 DAYS BEFORE ISSUING A FINAL NEGATIVE REPORT   Report Status PENDING   Incomplete   CULTURE, SPUTUM-ASSESSMENT     Status: Normal   Collection Time   08/25/11 10:23 AM      Component Value Range Status Comment   Specimen Description SPUTUM   Final    Special Requests NONE   Final    Sputum evaluation     Final    Value: THIS SPECIMEN IS ACCEPTABLE. RESPIRATORY CULTURE REPORT TO FOLLOW.   Report Status 08/25/2011 FINAL   Final   CULTURE, RESPIRATORY     Status: Normal   Collection Time   08/25/11 10:23 AM      Component Value Range Status Comment   Specimen Description SPUTUM    Final    Special Requests NONE   Final    Gram Stain     Final    Value: RARE WBC PRESENT,BOTH PMN AND MONONUCLEAR     NO SQUAMOUS EPITHELIAL CELLS SEEN     NO ORGANISMS SEEN   Culture FEW CANDIDA ALBICANS   Final    Report Status 08/27/2011 FINAL   Final      Labs: Basic Metabolic Panel:  Lab 08/26/11 2130 08/25/11 0320 08/24/11 1613  NA 140 139 141  K 3.8 3.8 --  CL 101 100 102  CO2 27 28 29   GLUCOSE 128* 145* 102*  BUN 36* 24* 21  CREATININE 1.84* 2.01* 1.96*  CALCIUM 8.4 8.5 9.0  MG -- 2.4 --  PHOS -- -- --   CBC:  Lab 08/26/11 0630 08/25/11 0320 08/24/11 1613  WBC 13.1* 3.9* 5.9  NEUTROABS -- -- 4.4  HGB 10.8* 10.9* 11.5*  HCT 33.2* 32.9* 35.2*  MCV 86.5 87.5 87.3  PLT 146* 134* 136*   Cardiac Enzymes:  Lab 08/25/11 1146 08/25/11 0320 08/24/11 2026  CKTOTAL 147 140 142  CKMB 4.3* 3.3 2.8  CKMBINDEX -- -- --  TROPONINI <0.30 <0.30 <0.30    Time coordinating discharge: 35 minutes  Signed:  Brendia Sacks, Orozco  [facility] 08/29/2011, 10:34 AM

## 2011-08-29 NOTE — Progress Notes (Signed)
Thank you to Darlyne Russian RN CM for this referral.  Patient is clinically appropriate for services, but his PCP (Dr Windle Guard) is not a [facility] affiliated physician rendering him ineligible for services.  However he may be appropriate for CM via [employer].  Please consider contacting Bertram Denver RN the on site [facility] Liaison at [phone].  For any additional questions or new referrals please contact Anibal Henderson BSN RN [facility] Liaison at [phone].

## 2011-08-29 NOTE — Progress Notes (Signed)
ANTICOAGULATION CONSULT NOTE - Follow Up Consult  Pharmacy Consult for Coumadin Indication: atrial fibrillation  No Known Allergies  Patient Measurements: Height: 5\' 8"  (172.7 cm) Weight: 243 lb 11.2 oz (110.542 kg) (a scale) IBW/kg (Calculated) : 68.4   Vital Signs: Temp: 97.7 F (36.5 C) (04/09 0920) Temp src: Oral (04/09 0920) BP: 132/77 mmHg (04/09 0945) Pulse Rate: 62  (04/09 0945)  Labs:  Basename 08/29/11 0500 08/28/11 0630 08/27/11 0557  HGB -- -- --  HCT -- -- --  PLT -- -- --  APTT -- -- --  LABPROT 38.0* 35.1* 32.1*  INR 3.80* 3.43* 3.06*  HEPARINUNFRC -- -- --  CREATININE -- -- --  CKTOTAL -- -- --  CKMB -- -- --  TROPONINI -- -- --   Estimated Creatinine Clearance: 39.2 ml/min (by C-G formula based on Cr of 1.84).   Medications:  Prescriptions prior to admission  Medication Sig Dispense Refill  . acetaminophen (TYLENOL) 500 MG tablet Take 500 mg by mouth every 6 (six) hours as needed. For pain      . amLODipine (NORVASC) 2.5 MG tablet Take 1 tablet (2.5 mg total) by mouth daily.  90 tablet  1  . aspirin 81 MG tablet Take 81 mg by mouth daily.        . cefUROXime (CEFTIN) 500 MG tablet Take 500 mg by mouth 2 (two) times daily.      . Cholecalciferol (VITAMIN D-3) 5000 UNITS TABS Take 1 tablet by mouth daily.       . clopidogrel (PLAVIX) 75 MG tablet TAKE 1 TABLET BY MOUTH EVERY DAY  90 tablet  3  . CRESTOR 10 MG tablet TAKE 1 TABELT BY MOUTH EVERY DAY  90 tablet  3  . doxazosin (CARDURA) 4 MG tablet Take 1 tablet (4 mg total) by mouth at bedtime.  30 tablet  11  . famotidine (PEPCID) 10 MG tablet Take 10 mg by mouth 2 (two) times daily as needed. For acid      . Ferrous Gluconate (IRON) 240 (27 FE) MG TABS Take 1 tablet by mouth daily.      . fish oil-omega-3 fatty acids 1000 MG capsule Take 1 g by mouth daily.       . furosemide (LASIX) 40 MG tablet Take 40 mg by mouth 2 (two) times daily.      . hydroxychloroquine (PLAQUENIL) 200 MG tablet Take 200 mg  by mouth 2 (two) times daily.       . metoprolol (LOPRESSOR) 50 MG tablet Take 1 tablet (50 mg total) by mouth 2 (two) times daily.  60 tablet  11  . potassium chloride (K-DUR) 10 MEQ tablet Take 10 mEq by mouth daily.        . traMADol (ULTRAM) 50 MG tablet Take 50 mg by mouth every 6 (six) hours as needed. For pain      . warfarin (COUMADIN) 5 MG tablet Take 2.5-5 mg by mouth daily. Take 0.5 tablet on Tuesday, Thursday, Saturday, Sunday.  Take 1 tablet on Monday, Wednesday, friday        Assessment: 78 yo M admitted with PNA (completed 5 days abx with Levaquin), also on Coumadin PTA for hx AFib.  INR has trended up likely as a result of interaction with Levaquin.  Will continue to hold dose tonight.  Since pt now off Levaquin anticipate INR will trend down soon.  No bleeding noted.  Goal of Therapy:  INR 2-3   Plan:  No Coumadin  tonight.  INR above goal. Follow-up with INR in AM.  Dixie Dials, Pharm.D., BCPS Clinical Pharmacist Pager [phone] 08/29/2011 10:23 AM

## 2011-08-29 NOTE — Progress Notes (Signed)
PROGRESS NOTE  Alex Orozco:[id number] DOB: 1930/03/07 DOA: 08/24/2011 PCP: Kaleen Mask, MD, MD Pulmonologist: Marcelyn Bruins, MD  Cardiologist: Leodis Sias, MD  Brief narrative: 78 year old man presented to the emergency department with shortness of breath, wheezing and fever. Admitted for pneumonia and CHF.  Past medical history: Coronary artery disease, atrial fibrillation, systolic congestive heart failure, moderate aortic stenosis  Consultants:  None  Procedures:  None  Antibiotics:  April 4: Levaquin  Interim History: Interval documentation reviewed. Afebrile, vital signs stable. Still on oxygen.  Subjective: Feels better. Would like to go home.  Objective: Filed Vitals:   08/28/11 2133 08/29/11 0602 08/29/11 0845 08/29/11 0920  BP: 184/70 166/74  132/77  Pulse: 57 61  62  Temp: 98.5 F (36.9 C) 97 F (36.1 C)  97.7 F (36.5 C)  TempSrc: Oral Oral  Oral  Resp: 19 20  16   Height:      Weight:  110.542 kg (243 lb 11.2 oz)    SpO2: 100% 98% 98% 96%    Intake/Output Summary (Last 24 hours) at 08/29/11 0939 Last data filed at 08/29/11 0813  Gross per 24 hour  Intake    970 ml  Output   1700 ml  Net   -730 ml    Exam:   General: Calm and comfortable in appearance.  Cardiovascular: Regular rate and rhythm. No murmur, rub, gallop. No change in lower extremity edema.  Telemetry: Sinus rhythm. No acute changes.  Respiratory: Rare wheezing today, improved air movement. No tachypnea.  Psychiatric: Grossly normal mood and affect. Speech fluent and appropriate.  Data Reviewed: Basic Metabolic Panel:  Lab 08/26/11 8119 08/25/11 0320 08/24/11 1613  NA 140 139 141  K 3.8 3.8 --  CL 101 100 102  CO2 27 28 29   GLUCOSE 128* 145* 102*  BUN 36* 24* 21  CREATININE 1.84* 2.01* 1.96*  CALCIUM 8.4 8.5 9.0  MG -- 2.4 --  PHOS -- -- --   CBC:  Lab 08/26/11 0630 08/25/11 0320 08/24/11 1613  WBC 13.1* 3.9* 5.9  NEUTROABS -- -- 4.4  HGB 10.8*  10.9* 11.5*  HCT 33.2* 32.9* 35.2*  MCV 86.5 87.5 87.3  PLT 146* 134* 136*   Cardiac Enzymes:  Lab 08/25/11 1146 08/25/11 0320 08/24/11 2026  CKTOTAL 147 140 142  CKMB 4.3* 3.3 2.8  CKMBINDEX -- -- --  TROPONINI <0.30 <0.30 <0.30   Studies: Dg Chest 2 View  08/24/2011  *RADIOLOGY REPORT*  Clinical Data: Shortness of breath.  CHEST - 2 VIEW  Comparison: 05/17/2009.  Findings: The heart is enlarged but stable.  Surgical changes from bypass surgery are again noted.  There is central vascular congestion and probable mild perihilar pulmonary edema.  No pleural effusions or pneumothorax.  Stable advanced degenerative changes involving both shoulders.  IMPRESSION: Mild CHF.  Original Report Authenticated By: P. Loralie Champagne, M.D.   Scheduled Meds:    . albuterol  2.5 mg Nebulization TID   And  . ipratropium  0.5 mg Nebulization TID  . aspirin  81 mg Oral Daily  . atorvastatin  10 mg Oral q1800  . clopidogrel  75 mg Oral Q breakfast  . dextromethorphan-guaiFENesin  1 tablet Oral BID  . doxazosin  4 mg Oral QHS  . hydroxychloroquine  200 mg Oral BID  . levofloxacin (LEVAQUIN) IV  250 mg Intravenous Q24H  . methylPREDNISolone (SOLU-MEDROL) injection  60 mg Intravenous Q24H  . metoprolol  50 mg Oral BID  . potassium chloride  10  mEq Oral Daily  . sodium chloride  3 mL Intravenous Q12H  . Warfarin - Pharmacist Dosing Inpatient   Does not apply q1800  . DISCONTD: furosemide  60 mg Intravenous Q12H  . DISCONTD: methylPREDNISolone (SOLU-MEDROL) injection  60 mg Intravenous Q12H  . DISCONTD: metoprolol  50 mg Oral BID  . DISCONTD: warfarin  2.5 mg Oral Q T,Th,S,Su-1800   Continuous Infusions:    Assessment/Plan: 1. Presumed pneumonia: Continues to improve. Discharge home on oral antibiotics (last day April 11), albuterol MDI as needed and oral steroid taper. Wean oxygen. Influenza negative.  2. Systolic congestive heart failure, acute component suspected: Compensated. Continue oral  Lasix, metoprolol. No ACE inhibitor based on kidney function for now but could be considered in the outpatient setting.Discontinue Norvasc 11 chronic lower extremity edema.  Cardiac enzymes negative. Ejection fraction 35-40%, moderate aortic stenosis by echocardiogram 05/10/2009. 3. Thrombocytopenia: Stable. Noted in 2010. Followup as an outpatient. 4. Chronic kidney disease stage III: Appears to be at baseline. 5. Atrial fibrillation: Appears stable.  INR elevated secondary to interaction with antibiotics. No warfarin. Close followup of INR as outpatient. Continue metoprolol. 6. Coronary artery disease: Appears stable. Continue aspirin, Plavix, Crestor.   Code Status: Full code. Family Communication: Discussed with wife at bedside April 9. All questions answered to her apparent satisfaction.  Disposition Plan: Home today.    Brendia Sacks, MD  [facility] Pager [phone] 7AM-7PM  If 7PM-7AM, please contact night-coverage [email or web address] Password [email or web address] 08/29/2011, 9:39 AM    LOS: 5 days

## 2011-08-30 ENCOUNTER — Ambulatory Visit (INDEPENDENT_AMBULATORY_CARE_PROVIDER_SITE_OTHER): Payer: Medicare Other | Admitting: Pharmacist

## 2011-08-30 DIAGNOSIS — I4891 Unspecified atrial fibrillation: Secondary | ICD-10-CM

## 2011-08-31 LAB — CULTURE, BLOOD (ROUTINE X 2)
Culture  Setup Time: 201304050129
Culture: NO GROWTH

## 2011-09-06 ENCOUNTER — Ambulatory Visit (INDEPENDENT_AMBULATORY_CARE_PROVIDER_SITE_OTHER): Payer: Medicare Other | Admitting: Physician Assistant

## 2011-09-06 ENCOUNTER — Encounter: Payer: Self-pay | Admitting: Physician Assistant

## 2011-09-06 ENCOUNTER — Ambulatory Visit (INDEPENDENT_AMBULATORY_CARE_PROVIDER_SITE_OTHER): Payer: Medicare Other | Admitting: Pharmacist

## 2011-09-06 VITALS — BP 169/70 | HR 54 | Ht 66.5 in | Wt 246.0 lb

## 2011-09-06 DIAGNOSIS — I509 Heart failure, unspecified: Secondary | ICD-10-CM

## 2011-09-06 DIAGNOSIS — I10 Essential (primary) hypertension: Secondary | ICD-10-CM

## 2011-09-06 DIAGNOSIS — I5032 Chronic diastolic (congestive) heart failure: Secondary | ICD-10-CM

## 2011-09-06 DIAGNOSIS — I2510 Atherosclerotic heart disease of native coronary artery without angina pectoris: Secondary | ICD-10-CM

## 2011-09-06 DIAGNOSIS — R0602 Shortness of breath: Secondary | ICD-10-CM

## 2011-09-06 DIAGNOSIS — I4891 Unspecified atrial fibrillation: Secondary | ICD-10-CM

## 2011-09-06 LAB — POCT INR: INR: 3.5

## 2011-09-06 LAB — BASIC METABOLIC PANEL
Calcium: 8.3 mg/dL — ABNORMAL LOW (ref 8.4–10.5)
GFR: 47.92 mL/min — ABNORMAL LOW (ref >60.00)
Glucose, Bld: 85 mg/dL (ref 70–99)
Potassium: 3.6 mEq/L (ref 3.5–5.1)
Sodium: 141 mEq/L (ref 135–145)

## 2011-09-06 LAB — BRAIN NATRIURETIC PEPTIDE: Pro B Natriuretic peptide (BNP): 619 pg/mL — ABNORMAL HIGH (ref 0.0–100.0)

## 2011-09-06 MED ORDER — FUROSEMIDE 40 MG PO TABS: 60.0000 mg | ORAL_TABLET | Freq: Two times a day (BID) | ORAL | Status: DC

## 2011-09-06 MED ORDER — POTASSIUM CHLORIDE CRYS ER 20 MEQ PO TBCR: 20.0000 meq | EXTENDED_RELEASE_TABLET | Freq: Every day | ORAL | Status: DC

## 2011-09-06 NOTE — Progress Notes (Signed)
[address] [place], [place]  [address] Phone: [phone] Fax:  [phone]  Date:  09/06/2011   Name:  Alex Orozco       DOB:  [birth year] MRN:  [id number]  PCP:  Kaleen Mask, MD, MD  Primary Cardiologist:  Dr. Delane Ginger   Primary Electrophysiologist:  None    History of Present Illness: Alex Orozco is a 78 y.o. male who presents for post hospital follow up.  He has a history of CAD, status post CABG, status post NSTEMI 9/10 treated with a DES to the SVG-D3, restrictive lung disease, chronic kidney disease, congestive heart failure in the setting of reduced LV function, hypertension, hyperlipidemia, moderate aortic stenosis, atrial fibrillation, anemia and thrombocytopenia.  He was admitted 4/4-4/9 with congestive heart failure in the setting of community-acquired pneumonia.  He was on the internal medicine service.  He was not seen by cardiology.  He was diuresed and treated with antibiotics.  Echocardiogram was checked 08/25/11: Mild LVH, EF 55-65%, grade 3 diastolic dysfunction, mild AS (Mean gradient 19), mild AI, Mild BAE.  Labs (4/13):  K 3.8, creatinine 1.84, CE's neg x 3, Hgb 10.8, PLT 146;  CXR with mild CHF.  He is still trying to get his strength back.  Notes Class 3 dyspnea, but this is his chronic symptom.  Sleeps at an incline without change.  No PND.  No chest pain.  No syncope.  Weights at home are going down.  BPs at home 120/80-140/80.  Past Medical History  Diagnosis Date  . Coronary artery disease     s/p CABG; NSTEMI 9/10:  LHC with S-RCA ok, S-D1 occluded (chronic), S-D3 occluded, L-LAD ok;  PCI:  [employer] DES to S-D3  . Lung disease, restrictive   . History of PFTs 01/20/2009  . Pleural effusion   . Chronic renal insufficiency   . CHF (congestive heart failure)     EF 40-50%;  echo 08/2011: mild LVH, EF 55-655, grade 3 diast dysfxn, mild AS (mean 19), mild AI, mild BAE  . Hypertension   . Dyslipidemia   . Aortic  stenosis     echo 4/13: mild AS with mean 19 mmHg  . Atrial fibrillation   . Anemia   . Difficult intubation   . Shortness of breath   . Arthritis     Current Outpatient Prescriptions  Medication Sig Dispense Refill  . acetaminophen (TYLENOL) 500 MG tablet Take 500 mg by mouth every 6 (six) hours as needed. For pain      . albuterol (PROVENTIL HFA;VENTOLIN HFA) 108 (90 BASE) MCG/ACT inhaler Inhale 2 puffs into the lungs every 6 (six) hours as needed for wheezing.  1 Inhaler  0  . aspirin 81 MG tablet Take 81 mg by mouth daily.        . Cholecalciferol (VITAMIN D-3) 5000 UNITS TABS Take 1 tablet by mouth daily.       . clopidogrel (PLAVIX) 75 MG tablet TAKE 1 TABLET BY MOUTH EVERY DAY  90 tablet  3  . CRESTOR 10 MG tablet TAKE 1 TABELT BY MOUTH EVERY DAY  90 tablet  3  . doxazosin (CARDURA) 4 MG tablet Take 1 tablet (4 mg total) by mouth at bedtime.  30 tablet  11  . famotidine (PEPCID) 10 MG tablet Take 10 mg by mouth 2 (two) times daily as needed. For acid      . Ferrous Gluconate (IRON) 240 (27 FE) MG TABS Take 1 tablet  by mouth daily.      . fish oil-omega-3 fatty acids 1000 MG capsule Take 1 g by mouth daily.       . furosemide (LASIX) 40 MG tablet Take 40 mg by mouth 2 (two) times daily.      . hydroxychloroquine (PLAQUENIL) 200 MG tablet Take 200 mg by mouth 2 (two) times daily.       . metoprolol (LOPRESSOR) 50 MG tablet Take 50 mg by mouth 2 (two) times daily.      . potassium chloride (K-DUR) 10 MEQ tablet Take 10 mEq by mouth daily.        . traMADol (ULTRAM) 50 MG tablet Take 50 mg by mouth every 6 (six) hours as needed. For pain      . warfarin (COUMADIN) 5 MG tablet Take 5 mg by mouth. Take as Directed      . metoprolol (LOPRESSOR) 50 MG tablet Take 1 tablet (50 mg total) by mouth 2 (two) times daily.  60 tablet  11    Allergies: No Known Allergies  History  Substance Use Topics  . Smoking status: Never Smoker   . Smokeless tobacco: Never Used  . Alcohol Use: No       ROS:  Please see the history of present illness.   All other systems reviewed and negative.   PHYSICAL EXAM: VS:  BP 169/70  Pulse 54  Ht 5' 6.5" (1.689 m)  Wt 246 lb (111.585 kg)  BMI 39.11 kg/m2 Well nourished, well developed, in no acute distress HEENT: normal Neck: + JVD Cardiac:  normal S1, S2; RRR; 2/6 systolic murmur RUSB Lungs:  Crackles in bases bilaterally with exp wheezes Abd: soft, nontender, no hepatomegaly Ext: 1+ bilateral LE edema (compression stockings on) Skin: warm and dry Neuro:  CNs 2-12 intact, no focal abnormalities noted  EKG:  Sinus bradycardia, heart rate 52, no change from prior tracing  ASSESSMENT AND PLAN:  1.  Chronic Diastolic CHF I think he is still volume overloaded.  Increase Lasix to 60 mg bid.  Increase K+ to 20 mEq daily.  Check BMET, BNP today and repeat a BMET in 5 days.  Arrange earlier follow up with Dr. Delane Ginger in 3-4 weeks.  He knows to continue weighing himself and to return sooner if weight going up or he develops worsening dyspnea/edema.    2.  Atrial Fibrillation Remains in NSR.  Coumadin managed in CVRR clinic  3.  Hypertension Will see how his BP responds to increased Lasix.  4.  Chronic Kidney Disease Will keep close eye on renal fxn with adjustments in Lasix.  5.  CAD No angina.  Continue ASA, Plavix and statin.  EF normal by recent echo.  6.  Aortic Stenosis Mild be most recent measurement.   Luna Glasgow, PA-C  11:05 AM 09/06/2011

## 2011-09-06 NOTE — Patient Instructions (Signed)
Your physician has recommended you make the following change in your medication: INCREASE LASIX TO 60 MG TWICE DAILY, INCREASE POTASSIUM TO 20 MEQ DAILY, NEW PRESCRIPTIONS HAVE BEEN SENT IN TODAY TO [facility]  Your physician recommends that you return for lab work in: TODAY BMET, BNP  REPEAT BMET 09/11/11 414.01, 401.1, 428.32  Your physician recommends that you schedule a follow-up appointment in: 3-4 WEEKS WITH DR. Elease Hashimoto

## 2011-09-08 ENCOUNTER — Telehealth: Payer: Self-pay | Admitting: *Deleted

## 2011-09-08 NOTE — Telephone Encounter (Signed)
s/w pt's wife this morning she is aware of lab results but states that pt noticed yesterday AM abdomen red, see documentation note for the rest...wife states abdomen red, pt states is prickly when he touches abdomen, denies any cp, sob. Wife states pt had not been outside yet yesterday when redness showed up on abdomen. Pt does have h/o previous GI bleed and is on warfarin and 81 asa. Pt saw Tereso Newcomer, [facility] on wed 4/17 and pt was told to hold wed night warfarin and to resume back on 1/2 tab on Thursday night, wife states she thinks pt is bleeding again internally. I told her that I would send a STAT message to [facility] and have one of the RN's call them back. They will be home until 10 am after that you will need to call wife's cell # [phone].

## 2011-09-08 NOTE — Telephone Encounter (Signed)
This is Dr. Harvie Bridge patient

## 2011-09-08 NOTE — Telephone Encounter (Signed)
Please refer to prior note from Margurite Auerbach CMA  that was done on lab work results from 09/08/11. Today I Spoke with wife, states pt has no blood in stool nor urine, denies abd pain, c/o reddish/ purple non raised discoloration across the  lower abdomen down to groin of Alex Orozco. Pt noticed this yesterday and stated he is unsure how long its been there, the discoloration is without change today, denies rash/ itching. Doesn't recall hurting self nor bug bite. Other than this discoloration pt feels "ok". C/O hard coughing over past 1-2 weeks, he was admitted to hospital 08/24/11 for pneumonia and CHF exacerbation, no heart cath done. INR has been checked 4/17 INR 3.5 and was held one day then resumed. Dr Elease Hashimoto was consulted, pt to f/u with PCP if need but can continue to watch and call if any changes, questions or concerns, wife agreed to plan.

## 2011-09-08 NOTE — Telephone Encounter (Signed)
Message copied by Tarri Fuller on Fri Sep 08, 2011  9:06 AM ------      Message from: [place], [place] T      Created: Fri Sep 08, 2011  5:47 AM       Ok      Repeat BMET as planned in office visit      Tereso Newcomer, [place]  5:47 AM 09/08/2011

## 2011-09-08 NOTE — Telephone Encounter (Signed)
NOTED///SEE TELEPHONE NOTE, PT WAS ADVISED

## 2011-09-08 NOTE — Telephone Encounter (Signed)
SPOKE WITH PT'S WIFE THIS AM AND ADVISED I WANTED TO TALK WITH DR Elease Hashimoto ABOUT REDDNESS IN ABDOMEN AND THAT I WOUL;D PHONE HER BACK IF DR NAHSER HAD ANY SUGGESTIONS--NT

## 2011-09-08 NOTE — Telephone Encounter (Signed)
Red across lower part of abdomen like a bruise, denies pain just prickly.  Just noticed yesterday. INR Wednesday was 3.5 and that dose was held.  Takes iron daily and stools are usually dark, no change.  He feels ok.  Did do a lot of hard coughing last week when he was in the hospital with pneumonia.  Will forward to  Dr. Patty Sermons for review

## 2011-09-08 NOTE — Telephone Encounter (Signed)
Will forward to Dr Elease Hashimoto and Michael Litter RN

## 2011-09-11 ENCOUNTER — Telehealth: Payer: Self-pay | Admitting: *Deleted

## 2011-09-11 ENCOUNTER — Other Ambulatory Visit (INDEPENDENT_AMBULATORY_CARE_PROVIDER_SITE_OTHER): Payer: Medicare Other

## 2011-09-11 DIAGNOSIS — I509 Heart failure, unspecified: Secondary | ICD-10-CM

## 2011-09-11 DIAGNOSIS — I2510 Atherosclerotic heart disease of native coronary artery without angina pectoris: Secondary | ICD-10-CM

## 2011-09-11 DIAGNOSIS — I10 Essential (primary) hypertension: Secondary | ICD-10-CM

## 2011-09-11 DIAGNOSIS — I5032 Chronic diastolic (congestive) heart failure: Secondary | ICD-10-CM

## 2011-09-11 LAB — BASIC METABOLIC PANEL
BUN: 25 mg/dL — ABNORMAL HIGH (ref 6–23)
Chloride: 105 mEq/L (ref 96–112)
GFR: 39.59 mL/min — ABNORMAL LOW (ref >60.00)
Potassium: 3.7 mEq/L (ref 3.5–5.1)
Sodium: 141 mEq/L (ref 135–145)

## 2011-09-11 NOTE — Telephone Encounter (Signed)
Message copied by Tarri Fuller on Mon Sep 11, 2011  4:41 PM ------      Message from: [place], [place] T      Created: Mon Sep 11, 2011  4:31 PM       Creatinine up a little but overall stable      Repeat bmet in one week      Tereso Newcomer, PA-C  4:30 PM 09/11/2011

## 2011-09-13 ENCOUNTER — Other Ambulatory Visit: Payer: Self-pay | Admitting: *Deleted

## 2011-09-13 MED ORDER — METOPROLOL TARTRATE 50 MG PO TABS: 50.0000 mg | ORAL_TABLET | Freq: Two times a day (BID) | ORAL | Status: DC

## 2011-09-20 ENCOUNTER — Telehealth: Payer: Self-pay | Admitting: *Deleted

## 2011-09-20 ENCOUNTER — Other Ambulatory Visit (INDEPENDENT_AMBULATORY_CARE_PROVIDER_SITE_OTHER): Payer: Medicare Other

## 2011-09-20 ENCOUNTER — Ambulatory Visit (INDEPENDENT_AMBULATORY_CARE_PROVIDER_SITE_OTHER): Payer: Medicare Other | Admitting: Pharmacist

## 2011-09-20 DIAGNOSIS — I10 Essential (primary) hypertension: Secondary | ICD-10-CM

## 2011-09-20 DIAGNOSIS — I509 Heart failure, unspecified: Secondary | ICD-10-CM

## 2011-09-20 DIAGNOSIS — I4891 Unspecified atrial fibrillation: Secondary | ICD-10-CM

## 2011-09-20 DIAGNOSIS — I5032 Chronic diastolic (congestive) heart failure: Secondary | ICD-10-CM

## 2011-09-20 DIAGNOSIS — I2510 Atherosclerotic heart disease of native coronary artery without angina pectoris: Secondary | ICD-10-CM

## 2011-09-20 LAB — BASIC METABOLIC PANEL
CO2: 28 mEq/L (ref 19–32)
Chloride: 105 mEq/L (ref 96–112)
Creatinine, Ser: 2 mg/dL — ABNORMAL HIGH (ref 0.4–1.5)
Potassium: 3.5 mEq/L (ref 3.5–5.1)
Sodium: 143 mEq/L (ref 135–145)

## 2011-09-20 LAB — POCT INR: INR: 4.3

## 2011-09-20 NOTE — Telephone Encounter (Signed)
Message copied by Tarri Fuller on Wed Sep 20, 2011  5:47 PM ------      Message from: [place], [place] T      Created: Wed Sep 20, 2011  5:36 PM       Creatinine still going up.      Hold one dose of Lasix (ie take once instead of twice for one day)      Then decrease daily dose to 40 mg bid (old dose)      Repeat BMET in one week      Tereso Newcomer, PA-C  5:36 PM 09/20/2011

## 2011-09-27 ENCOUNTER — Other Ambulatory Visit (INDEPENDENT_AMBULATORY_CARE_PROVIDER_SITE_OTHER): Payer: Medicare Other

## 2011-09-27 DIAGNOSIS — I5032 Chronic diastolic (congestive) heart failure: Secondary | ICD-10-CM

## 2011-09-27 DIAGNOSIS — I10 Essential (primary) hypertension: Secondary | ICD-10-CM

## 2011-09-27 DIAGNOSIS — I509 Heart failure, unspecified: Secondary | ICD-10-CM

## 2011-09-27 LAB — BASIC METABOLIC PANEL
BUN: 28 mg/dL — ABNORMAL HIGH (ref 6–23)
CO2: 24 mEq/L (ref 19–32)
Chloride: 105 mEq/L (ref 96–112)
GFR: 31.63 mL/min — ABNORMAL LOW (ref >60.00)
Glucose, Bld: 89 mg/dL (ref 70–99)
Potassium: 4.3 mEq/L (ref 3.5–5.1)
Sodium: 141 mEq/L (ref 135–145)

## 2011-09-28 ENCOUNTER — Telehealth: Payer: Self-pay | Admitting: *Deleted

## 2011-09-28 DIAGNOSIS — I5032 Chronic diastolic (congestive) heart failure: Secondary | ICD-10-CM

## 2011-09-28 NOTE — Telephone Encounter (Signed)
Message copied by Tarri Fuller on Thu Sep 28, 2011  5:35 PM ------      Message from: [place], [place] T      Created: Thu Sep 28, 2011  8:29 AM       Creatinine still up.      Likely getting too dry.      Stop K+      Hold Lasix for one day.      Then, continue taking Lasix at 40 mg daily (decreased dose).        Do not take K+ with this.      Weigh daily and call if:  Weight up 2-3 lbs in one day, increased swelling or increased dyspnea.       Repeat BMET in one week (sees Dr. Elease Hashimoto next week - can do at that appointment).      Tereso Newcomer, PA-C  8:27 AM 09/28/2011

## 2011-09-28 NOTE — Telephone Encounter (Signed)
Message copied by Tarri Fuller on Thu Sep 28, 2011 12:41 PM ------      Message from: [place], [place] T      Created: Thu Sep 28, 2011  8:29 AM       Creatinine still up.      Likely getting too dry.      Stop K+      Hold Lasix for one day.      Then, continue taking Lasix at 40 mg daily (decreased dose).        Do not take K+ with this.      Weigh daily and call if:  Weight up 2-3 lbs in one day, increased swelling or increased dyspnea.       Repeat BMET in one week (sees Dr. Elease Hashimoto next week - can do at that appointment).      Tereso Newcomer, PA-C  8:27 AM 09/28/2011

## 2011-09-28 NOTE — Telephone Encounter (Signed)
Follow-up:     Patient called again.

## 2011-09-28 NOTE — Telephone Encounter (Signed)
pt's wife notified of lab resultsand recommendations from [place]. PAC, appt w/Nahser 5/15 will repeat bmet that day. Lab order placed today for bmet 5/15

## 2011-09-28 NOTE — Telephone Encounter (Signed)
Follow-up:    Patient returned your call.  Please call back. 

## 2011-10-04 ENCOUNTER — Ambulatory Visit (INDEPENDENT_AMBULATORY_CARE_PROVIDER_SITE_OTHER): Payer: Medicare Other | Admitting: Cardiovascular Disease

## 2011-10-04 ENCOUNTER — Encounter: Payer: Self-pay | Admitting: Cardiovascular Disease

## 2011-10-04 ENCOUNTER — Ambulatory Visit (INDEPENDENT_AMBULATORY_CARE_PROVIDER_SITE_OTHER): Payer: Medicare Other | Admitting: *Deleted

## 2011-10-04 VITALS — BP 130/80 | HR 60 | Ht 66.5 in | Wt 246.0 lb

## 2011-10-04 DIAGNOSIS — I5032 Chronic diastolic (congestive) heart failure: Secondary | ICD-10-CM

## 2011-10-04 DIAGNOSIS — R0602 Shortness of breath: Secondary | ICD-10-CM

## 2011-10-04 DIAGNOSIS — I4891 Unspecified atrial fibrillation: Secondary | ICD-10-CM

## 2011-10-04 DIAGNOSIS — N289 Disorder of kidney and ureter, unspecified: Secondary | ICD-10-CM

## 2011-10-04 DIAGNOSIS — I509 Heart failure, unspecified: Secondary | ICD-10-CM

## 2011-10-04 LAB — BASIC METABOLIC PANEL
BUN: 20 mg/dL (ref 6–23)
CO2: 30 mEq/L (ref 19–32)
Chloride: 102 mEq/L (ref 96–112)
Creatinine, Ser: 1.9 mg/dL — ABNORMAL HIGH (ref 0.4–1.5)
Potassium: 3.8 mEq/L (ref 3.5–5.1)

## 2011-10-04 LAB — BRAIN NATRIURETIC PEPTIDE: Pro B Natriuretic peptide (BNP): 1055 pg/mL — ABNORMAL HIGH (ref 0.0–100.0)

## 2011-10-04 NOTE — Patient Instructions (Signed)
Your physician recommends that you schedule a follow-up appointment in: 3 months with Dr. Elease Hashimoto.   Your physician recommends that you return for lab work in: 3 months.

## 2011-10-04 NOTE — Progress Notes (Signed)
Alex Orozco Date of Birth  May 22, 1930       [facility]    [place] [address]  [address], [address] [place], [place]  [address]   [place], [place]  [address] [phone]     [phone]   Fax  [phone]    Fax [phone]  Problem List: 1. Coronary artery disease- s/p CABG, s/p stenting of SVG to D3. He has stenosis of native LCx. 2. Restrictive lung disease 3. Chronic renal insufficiency 4. Congestive heart failure-ejection fraction of 55-60%  by echo.  There is evidence of diastolic dysfunction. 5. Hypertension 6. Dyslipidemia 7. Moderate aortic stenosis A. Atrial fibrillation 9. History of strep bacteremia 10. History of anemia   History of Present Illness: He was  admitted in April, 2013 with an episode of pneumonia and systolic congestive heart failure.  He was discharge with Levofloxacin, steroid taper, and nebs.  He saw Tereso Newcomer in his Lasix dose was increased to 60 mg twice a day. He had acute worsening of his renal function with a creatinine up to 2.0. His Lasix was  Held for several doses. Currently he is on a 40 mg twice a day which has been his normal dose for quite some time.  He is feeling OK now.  He complains of generalized fatigue.    Current Outpatient Prescriptions on File Prior to Visit  Medication Sig Dispense Refill  . acetaminophen (TYLENOL) 500 MG tablet Take 500 mg by mouth every 6 (six) hours as needed. For pain      . aspirin 81 MG tablet Take 81 mg by mouth daily.        . Cholecalciferol (VITAMIN D-3) 5000 UNITS TABS Take 1 tablet by mouth daily.       . clopidogrel (PLAVIX) 75 MG tablet TAKE 1 TABLET BY MOUTH EVERY DAY  90 tablet  3  . CRESTOR 10 MG tablet TAKE 1 TABELT BY MOUTH EVERY DAY  90 tablet  3  . doxazosin (CARDURA) 4 MG tablet Take 1 tablet (4 mg total) by mouth at bedtime.  30 tablet  11  . famotidine (PEPCID) 10 MG tablet Take 10 mg by mouth 2 (two) times daily as needed. For acid       . Ferrous Gluconate (IRON) 240 (27 FE) MG TABS Take 1 tablet by mouth daily.      . fish oil-omega-3 fatty acids 1000 MG capsule Take 1 g by mouth daily.       . furosemide (LASIX) 40 MG tablet STARTING 09/21/11 TAKE 40 MG TWICE DAILY      . hydroxychloroquine (PLAQUENIL) 200 MG tablet Take 200 mg by mouth 2 (two) times daily.       . metoprolol (LOPRESSOR) 50 MG tablet Take 1 tablet (50 mg total) by mouth 2 (two) times daily.  60 tablet  6  . traMADol (ULTRAM) 50 MG tablet Take 50 mg by mouth every 6 (six) hours as needed. For pain      . warfarin (COUMADIN) 5 MG tablet Take 5 mg by mouth. Take as Directed      . metoprolol (LOPRESSOR) 50 MG tablet Take 1 tablet (50 mg total) by mouth 2 (two) times daily.  60 tablet  11  . potassium chloride SA (K-DUR,KLOR-CON) 20 MEQ tablet Take 1 tablet (20 mEq total) by mouth daily.  30 tablet  11    No Known Allergies  Past Medical History  Diagnosis Date  .  Coronary artery disease     s/p CABG; NSTEMI 9/10:  LHC with S-RCA ok, S-D1 occluded (chronic), S-D3 occluded, L-LAD ok;  PCI:  Endeavour DES to S-D3  . Lung disease, restrictive   . History of PFTs 01/20/2009  . Pleural effusion   . Chronic renal insufficiency   . CHF (congestive heart failure)     EF 40-50%;  echo 08/2011: mild LVH, EF 55-655, grade 3 diast dysfxn, mild AS (mean 19), mild AI, mild BAE  . Hypertension   . Dyslipidemia   . Aortic stenosis     echo 4/13: mild AS with mean 19 mmHg  . Atrial fibrillation   . Anemia   . Difficult intubation   . Shortness of breath   . Arthritis     Past Surgical History  Procedure Date  . Coronary artery bypass graft 1999  . Coronary angioplasty with stent placement     OF THE SAPHENOUS VEIN GRAFT TO THE THIRD DIAGONAL.  HE HAS MODERATE TO SEVERE STENOSIS REMAINING ON HIS NATIVE LEFT CIRCUMFLEX PROXIMAL VESSEL  . Back surgery   . Hernia repair   . Dccv   . Cataract extraction     bilateral    History  Smoking status  . Never Smoker     Smokeless tobacco  . Never Used    History  Alcohol Use No    Family History  Problem Relation Age of Onset  . Heart failure Mother   . Heart attack Father   . Hypertension Father     Reviw of Systems:  Reviewed in the HPI.  All other systems are negative.  Physical Exam: Blood pressure 130/80, pulse 60, height 5' 6.5" (1.689 m), weight 246 lb (111.585 kg). General: Well developed, well nourished, in no acute distress.  Head: Normocephalic, atraumatic, sclera non-icteric, mucus membranes are moist,   Neck: Supple. Carotids are 2 + without bruits. No JVD  Lungs: Clear bilaterally to auscultation.  Heart: regular rate.  normal  S1 S2. Soft systolic murmur  Abdomen: Soft, non-tender, non-distended with normal bowel sounds. No hepatomegaly. No rebound/guarding. No masses.  Msk:  Strength and tone are normal  Extremities: No clubbing or cyanosis. 1-2+ leg edema.  Unchanged from previous exams  Neuro: Alert and oriented X 3. Moves all extremities spontaneously.  Psych:  Responds to questions appropriately with a normal affect.  ECG:  Assessment / Plan:

## 2011-10-04 NOTE — Assessment & Plan Note (Signed)
Alex Orozco is relatively stable. He does have chronic diastolic congestive heart failure. He also has chronic renal insufficiency. The past we have caused significant renal dysfunction when we have tried to diurese him to vigorously. His last creatinine was around 2.0. His baseline is around 1.7-1.8.  We will continue on his current dose of Lasix. I've asked him to elevate his legs for an hour each day. I've given him a brochure for the lounge Dr.  Diamantina Providence see him again in 3 months for an office visit and basic metabolic profile and be metastatic peptide.

## 2011-10-04 NOTE — Assessment & Plan Note (Signed)
His blood pressure is well-controlled 

## 2011-10-09 ENCOUNTER — Telehealth: Payer: Self-pay | Admitting: *Deleted

## 2011-10-09 DIAGNOSIS — I2510 Atherosclerotic heart disease of native coronary artery without angina pectoris: Secondary | ICD-10-CM

## 2011-10-09 DIAGNOSIS — I10 Essential (primary) hypertension: Secondary | ICD-10-CM

## 2011-10-09 DIAGNOSIS — I5032 Chronic diastolic (congestive) heart failure: Secondary | ICD-10-CM

## 2011-10-09 MED ORDER — FUROSEMIDE 40 MG PO TABS: ORAL_TABLET | ORAL | Status: DC

## 2011-10-09 NOTE — Telephone Encounter (Signed)
Message copied by Antony Odea on Mon Oct 09, 2011  4:13 PM ------      Message from: Vesta Mixer      Created: Thu Oct 05, 2011  6:39 PM       His renal function is improved a little bit. His BNP is a little bit high.      He's been instructed to elevate his legs for the next several weeks. If this does not result in any significant improvement of his leg edema and we will give him an alternating dose of Lasix 40 mg twice a day alternating with Lasix 60 mg twice a day. Recheck labs in one month.

## 2011-10-09 NOTE — Telephone Encounter (Signed)
Per Dr Elease Hashimoto pt to take lasix 60 mg am 40 mg pm redraw in 1 month/ set. Wife verbalized understanding.

## 2011-10-19 ENCOUNTER — Ambulatory Visit (INDEPENDENT_AMBULATORY_CARE_PROVIDER_SITE_OTHER): Payer: Medicare Other

## 2011-10-19 DIAGNOSIS — I4891 Unspecified atrial fibrillation: Secondary | ICD-10-CM

## 2011-10-19 LAB — POCT INR: INR: 2.1

## 2011-10-23 ENCOUNTER — Encounter: Payer: Self-pay | Admitting: Nurse Practitioner

## 2011-10-23 ENCOUNTER — Ambulatory Visit (INDEPENDENT_AMBULATORY_CARE_PROVIDER_SITE_OTHER): Payer: Medicare Other | Admitting: Nurse Practitioner

## 2011-10-23 ENCOUNTER — Telehealth: Payer: Self-pay | Admitting: Cardiovascular Disease

## 2011-10-23 VITALS — BP 150/68 | HR 60 | Ht 66.0 in | Wt 245.8 lb

## 2011-10-23 DIAGNOSIS — R0989 Other specified symptoms and signs involving the circulatory and respiratory systems: Secondary | ICD-10-CM

## 2011-10-23 DIAGNOSIS — R0609 Other forms of dyspnea: Secondary | ICD-10-CM

## 2011-10-23 DIAGNOSIS — R0600 Dyspnea, unspecified: Secondary | ICD-10-CM

## 2011-10-23 DIAGNOSIS — I2510 Atherosclerotic heart disease of native coronary artery without angina pectoris: Secondary | ICD-10-CM

## 2011-10-23 DIAGNOSIS — I10 Essential (primary) hypertension: Secondary | ICD-10-CM

## 2011-10-23 MED ORDER — AMLODIPINE BESYLATE 2.5 MG PO TABS: 2.5000 mg | ORAL_TABLET | Freq: Every day | ORAL | Status: DC

## 2011-10-23 NOTE — Patient Instructions (Signed)
Your physician recommends that you schedule a follow-up appointment after stress test Your physician has recommended you make the following change in your medication: START Amlodipine 2.5 mg daily Your physician has requested that you have a lexiscan myoview. For further information please visit [email or web address] Please follow instruction sheet, as given.

## 2011-10-23 NOTE — Progress Notes (Signed)
Patient Name: MONTREZ MARIETTA Date of Encounter: 10/23/2011  Primary Care Provider:  Kaleen Mask, MD, MD Primary Cardiologist:  Katherina Right, MD  Patient Profile  78 year old male with history of CAD and diastolic CHF who presents secondary to ongoing dyspnea.  Problem List   Past Medical History  Diagnosis Date  . Coronary artery disease     a.  s/p CABG;  b. NSTEMI 9/10:  LHC with S-RCA ok, S-D1 occluded (chronic), S-D3 occluded, L-LAD ok;  PCI:  Endeavour DES to S-D3  . Lung disease, restrictive   . History of PFTs 01/20/2009  . Pleural effusion   . Chronic renal insufficiency   . CHF (congestive heart failure)     a. Previous EF 40-50%;  b. Echo 08/2011: mild LVH, EF 55-65, grade 3 diast dysfxn, mild AS (mean 19), mild AI, mild BAE  . Hypertension   . Dyslipidemia   . Aortic stenosis     a. echo 4/13: mild AS with mean 19 mmHg  . Atrial fibrillation     a. Chronic coumadin  . Anemia   . Difficult intubation   . Shortness of breath   . Arthritis    Past Surgical History  Procedure Date  . Coronary artery bypass graft 1999  . Coronary angioplasty with stent placement     OF THE SAPHENOUS VEIN GRAFT TO THE THIRD DIAGONAL.  HE HAS MODERATE TO SEVERE STENOSIS REMAINING ON HIS NATIVE LEFT CIRCUMFLEX PROXIMAL VESSEL  . Back surgery   . Hernia repair   . Dccv   . Cataract extraction     bilateral    Allergies  No Known Allergies  HPI  78 year old male with the above complex problem list.  He has been seen frequently in clinic lately secondary to ongoing dyspnea with alterations made to his Lasix.  He has chronic renal insufficiency and has a fairly narrow euvolemic window.  Today, he reports ongoing dyspnea and exertion with minimal activity.  He has not had any chest pain.  He has stable two-pillow orthopnea along with stable lower extremity edema which he manages both with Lasix therapy and support hose.  He has noted some early satiety.  He denies presyncope or  syncope.  Despite ongoing and progressive symptoms, his weight has been very steady in the 245 range.  Home Medications  Prior to Admission medications   Medication Sig Start Date End Date Taking? Authorizing Provider  acetaminophen (TYLENOL) 500 MG tablet Take 500 mg by mouth every 6 (six) hours as needed. For pain   Yes Historical Provider, MD  aspirin 81 MG tablet Take 81 mg by mouth daily.     Yes Historical Provider, MD  brimonidine-timolol (COMBIGAN) 0.2-0.5 % ophthalmic solution Place 1 drop into the right eye every 12 (twelve) hours.   Yes Historical Provider, MD  Cholecalciferol (VITAMIN D-3) 5000 UNITS TABS Take 1 tablet by mouth daily.    Yes Historical Provider, MD  clopidogrel (PLAVIX) 75 MG tablet TAKE 1 TABLET BY MOUTH EVERY DAY 02/20/11  Yes Vesta Mixer, MD  CRESTOR 10 MG tablet TAKE 1 TABELT BY MOUTH EVERY DAY 03/20/11  Yes Vesta Mixer, MD  doxazosin (CARDURA) 4 MG tablet Take 1 tablet (4 mg total) by mouth at bedtime. 02/02/11  Yes Vesta Mixer, MD  famotidine (PEPCID) 10 MG tablet Take 10 mg by mouth 2 (two) times daily as needed. For acid   Yes Historical Provider, MD  Ferrous Gluconate (IRON) 240 (27 FE) MG  TABS Take 1 tablet by mouth daily.   Yes Historical Provider, MD  fish oil-omega-3 fatty acids 1000 MG capsule Take 1 g by mouth daily.    Yes Historical Provider, MD  furosemide (LASIX) 40 MG tablet STARTING 10/10/11 taking 60 MG in morning and 40 mg in evening 10/09/11  Yes Vesta Mixer, MD  hydroxychloroquine (PLAQUENIL) 200 MG tablet Take 200 mg by mouth 2 (two) times daily.    Yes Historical Provider, MD  metoprolol (LOPRESSOR) 50 MG tablet Take 1 tablet (50 mg total) by mouth 2 (two) times daily. 09/13/11  Yes Vesta Mixer, MD  traMADol (ULTRAM) 50 MG tablet Take 50 mg by mouth every 6 (six) hours as needed. For pain   Yes Historical Provider, MD  warfarin (COUMADIN) 5 MG tablet Take 5 mg by mouth. Take as Directed   Yes Historical Provider, MD    amLODipine (NORVASC) 2.5 MG tablet Take 1 tablet (2.5 mg total) by mouth daily. 10/23/11 10/22/12  Ok Anis, NP   Review of Systems Dyspnea on exertion, early satiety, and lower extremity edema as outlined above. All other systems reviewed and are otherwise negative except as noted above.  Physical Exam  Blood pressure 150/68, pulse 60, height 5\' 6"  (1.676 m), weight 245 lb 12.8 oz (111.494 kg).  General: Pleasant, NAD Psych: Normal affect. Neuro: Alert and oriented X 3. Moves all extremities spontaneously. HEENT: Normal  Neck: Supple without bruits.  Obese and difficult to assess JVP. Lungs:  Resp regular and unlabored, CTA. Heart: RRR no s3, s4.  2/6 systolic ejection murmur loudest at the right upper sternal border. Abdomen: Soft, non-tender, non-distended, BS + x 4.  Extremities: No clubbing, cyanosis. DP/PT/Radials 2+ and equal bilaterally.  Trace to 1+ bilateral lower extremity edema to the knee.  Accessory Clinical Findings  ECG - sinus bradycardia, 57, no acute ST or T changes.  Assessment & Plan  1.  Chronic diastolic CHF: Patient has had progressive symptoms though his weight has been very steady.  Attempts to titrate his diuretic in the past at resulted in worsening renal failure.  His volume appears to be stable on exam it is not clear that this is entirely to blame for his dyspnea.  His blood pressure is trending high in the 150s.  He had previous had been on amlodipine therapy however this was discontinued following his hospitalization.  I will resume his amlodipine and 2.5 mg daily which was his previous dose.  Perhaps with better blood pressure control he will have improved filling pressures and reduced dyspnea.  2.  Coronary artery disease: In discussing patient's dyspnea, he reports that prior to his bypass surgery dyspnea was his main symptom.  At the time of his non-STEMI in 2010, he also had some chest pain.  He has not had any chest pain recently.  We will  obtain a Lexiscan Myoview to assess ischemia understanding that diagnostic catheterization would be reserved only for severe ischemia, given his renal insufficiency.  Continue aspirin, Plavix, statin, and beta blocker therapy.  3.  Hypertension: Adding amlodipine as above.  4.  Disposition: Followup in approximately 2 weeks after stress testing and review with Dr. Elease Hashimoto.  Nicolasa Ducking, NP 10/23/2011, 5:35 PM

## 2011-10-23 NOTE — Telephone Encounter (Signed)
New msg Pt's wife called and said he has been having some sob for the last week. Please call

## 2011-10-31 ENCOUNTER — Ambulatory Visit (INDEPENDENT_AMBULATORY_CARE_PROVIDER_SITE_OTHER): Payer: Medicare Other | Admitting: Pharmacist

## 2011-10-31 ENCOUNTER — Other Ambulatory Visit (INDEPENDENT_AMBULATORY_CARE_PROVIDER_SITE_OTHER): Payer: Medicare Other

## 2011-10-31 ENCOUNTER — Ambulatory Visit (HOSPITAL_COMMUNITY): Payer: Medicare Other | Attending: Internal Medicine | Admitting: Radiology

## 2011-10-31 VITALS — BP 175/78 | HR 55 | Ht 66.0 in | Wt 243.0 lb

## 2011-10-31 DIAGNOSIS — R0602 Shortness of breath: Secondary | ICD-10-CM

## 2011-10-31 DIAGNOSIS — I4949 Other premature depolarization: Secondary | ICD-10-CM

## 2011-10-31 DIAGNOSIS — I509 Heart failure, unspecified: Secondary | ICD-10-CM

## 2011-10-31 DIAGNOSIS — I4891 Unspecified atrial fibrillation: Secondary | ICD-10-CM | POA: Insufficient documentation

## 2011-10-31 DIAGNOSIS — R0989 Other specified symptoms and signs involving the circulatory and respiratory systems: Secondary | ICD-10-CM | POA: Insufficient documentation

## 2011-10-31 DIAGNOSIS — R0609 Other forms of dyspnea: Secondary | ICD-10-CM | POA: Insufficient documentation

## 2011-10-31 DIAGNOSIS — R0600 Dyspnea, unspecified: Secondary | ICD-10-CM

## 2011-10-31 DIAGNOSIS — N289 Disorder of kidney and ureter, unspecified: Secondary | ICD-10-CM

## 2011-10-31 DIAGNOSIS — I2510 Atherosclerotic heart disease of native coronary artery without angina pectoris: Secondary | ICD-10-CM

## 2011-10-31 DIAGNOSIS — I5032 Chronic diastolic (congestive) heart failure: Secondary | ICD-10-CM

## 2011-10-31 LAB — BASIC METABOLIC PANEL
Chloride: 106 mEq/L (ref 96–112)
GFR: 33.41 mL/min — ABNORMAL LOW (ref >60.00)
Glucose, Bld: 95 mg/dL (ref 70–99)
Potassium: 4.4 mEq/L (ref 3.5–5.1)
Sodium: 143 mEq/L (ref 135–145)

## 2011-10-31 LAB — BRAIN NATRIURETIC PEPTIDE: Pro B Natriuretic peptide (BNP): 848 pg/mL — ABNORMAL HIGH (ref 0.0–100.0)

## 2011-10-31 MED ORDER — TECHNETIUM TC 99M TETROFOSMIN IV KIT
10.0000 | PACK | Freq: Once | INTRAVENOUS | Status: AC | PRN
  Administered 2011-10-31: 10 via INTRAVENOUS

## 2011-10-31 MED ORDER — TECHNETIUM TC 99M TETROFOSMIN IV KIT
30.0000 | PACK | Freq: Once | INTRAVENOUS | Status: AC | PRN
  Administered 2011-10-31: 30 via INTRAVENOUS

## 2011-10-31 MED ORDER — REGADENOSON 0.4 MG/5ML IV SOLN
0.4000 mg | Freq: Once | INTRAVENOUS | Status: AC
  Administered 2011-10-31: 0.4 mg via INTRAVENOUS

## 2011-10-31 NOTE — Progress Notes (Signed)
[facility] SITE 3 NUCLEAR MED [address] [place] [address] [phone]  Cardiology Nuclear Med Study  Alex Orozco is a 78 y.o. male     MRN : [id number]     DOB: July 19, 1929  Procedure Date: 10/31/2011  Nuclear Med Background Indication for Stress Test:  Evaluation for Ischemia and Stent/Graft Patency History:  '99 CABG; '08 OZH:[id number], EF=56%; 9/10 NSTEMI>PTCA/Stent-SVG>DX-3; 4/13 Echo:EF=55-65%, mild AS/AI; Chronic Afib Cardiac Risk Factors: Hypertension, Lipids, NIDDM and Obesity  Symptoms:  DOE and Fatigue   Nuclear Pre-Procedure Caffeine/Decaff Intake:  None NPO After: 7:30am   Lungs:  clear O2 Sat: 95% on room air. IV 0.9% NS with Angio Cath:  22g  IV Site: L Hand  IV Started by:  Alex Parsons, RN  Chest Size (in):  50 Cup Size: n/a  Height: 5\' 6"  (1.676 m)  Weight:  243 lb (110.224 kg)  BMI:  Body mass index is 39.22 kg/(m^2). Tech Comments:  Metoprolol taken @ 730 am    Nuclear Med Study 1 or 2 day study: 1 day  Stress Test Type:  Treadmill/Lexiscan  Reading MD: Alex Meres, MD  Order Authorizing Provider:  Kristeen Miss, MD  Resting Radionuclide: Technetium 15m Tetrofosmin  Resting Radionuclide Dose: 11.0 mCi   Stress Radionuclide:  Technetium 28m Tetrofosmin  Stress Radionuclide Dose: 32.9 mCi           Stress Protocol Rest HR: 55 Stress HR: 67  Rest BP: 175/78 Stress BP: 147/59  Exercise Time (min): 2:00 METS: n/a   Predicted Max HR: 141 bpm % Max HR: 47.52 bpm Rate Pressure Product: 9849   Dose of Adenosine (mg):  n/a Dose of Lexiscan: 0.4 mg  Dose of Atropine (mg): n/a Dose of Dobutamine: n/a mcg/kg/min (at max HR)  Stress Test Technologist: Alex Orozco, CMA-N  Nuclear Technologist:  Alex Orozco, RT-N     Rest Procedure:  Myocardial perfusion imaging was performed at rest 45 minutes following the intravenous administration of Technetium 90m Tetrofosmin.  Rest ECG: Nonspecific ST-T wave changes with occasional  PVC.  Stress Procedure:  The patient received IV Lexiscan 0.4 mg over 15-seconds with concurrent low level exercise and then Technetium 24m Tetrofosmin was injected at 30-seconds while the patient continued walking one more minute. There were no significant changes with Lexiscan bolus, occasional PVC's were noted. Quantitative spect images were obtained after a 45-minute delay.  Stress ECG: No significant change from baseline ECG  QPS Raw Data Images:  Patient scanned with both arms down. + soft tissue attenuation over lateral wall Stress Images:  There is a medium-sized area of moderately reduced tracer uptak over the basilar to mid lateral wall. Rest Images:  There is a medium-sized area of moderately reduced tracer uptak over the basilar to mid lateral wall. Subtraction (SDS):  There is a medium-sized, fixed area of moderately reduced tracer uptake over the basilar to mid lateral wall. Transient Ischemic Dilatation (Normal <1.22):  1.15 Lung/Heart Ratio (Normal <0.45):  .44  Quantitative Gated Spect Images QGS EDV:  163 ml QGS ESV:  102 ml LV Ejection Fraction: 37%.  LV Wall Motion:  LV is dilated. There is severe hypokinesis of the enitre inferior wall and septum. Mild hypokinesis of the mid to distal lateral wall.   Impression Exercise Capacity:  Lexiscan with low level exercise. BP Response:  Normal blood pressure response. Clinical Symptoms:  n/a ECG Impression:  No significant ST segment change suggestive of ischemia. Comparison with Prior Nuclear Study: No  images to compare  Overall Impression:  Abnormal stress nuclear study. There is a medium-sized, fixed area of moderately reduced tracer uptake over the basilar to mid lateral wall suggestive of previous infarct. However patient was scanned with arms down so there may also be some soft tissue attenuation. No ischemia. EF 37%  Alex Orozco 5:23 PM

## 2011-11-02 ENCOUNTER — Telehealth: Payer: Self-pay | Admitting: *Deleted

## 2011-11-02 ENCOUNTER — Other Ambulatory Visit: Payer: Medicare Other

## 2011-11-02 NOTE — Telephone Encounter (Signed)
Labs reviewed by Norma Fredrickson np, for 10/31/11, creat noted, bnp coming down, OK to f./u with Ward Givens then Dr Elease Hashimoto.

## 2011-11-15 ENCOUNTER — Encounter: Payer: Self-pay | Admitting: Nurse Practitioner

## 2011-11-15 ENCOUNTER — Ambulatory Visit (INDEPENDENT_AMBULATORY_CARE_PROVIDER_SITE_OTHER): Payer: Medicare Other | Admitting: Nurse Practitioner

## 2011-11-15 VITALS — BP 119/72 | HR 72 | Wt 251.0 lb

## 2011-11-15 DIAGNOSIS — I5032 Chronic diastolic (congestive) heart failure: Secondary | ICD-10-CM

## 2011-11-15 DIAGNOSIS — R0989 Other specified symptoms and signs involving the circulatory and respiratory systems: Secondary | ICD-10-CM

## 2011-11-15 DIAGNOSIS — I509 Heart failure, unspecified: Secondary | ICD-10-CM

## 2011-11-15 DIAGNOSIS — I2510 Atherosclerotic heart disease of native coronary artery without angina pectoris: Secondary | ICD-10-CM

## 2011-11-15 NOTE — Progress Notes (Signed)
Patient Name: Alex Orozco Date of Encounter: 11/15/2011  Primary Care Provider:  Kaleen Mask, MD Primary Cardiologist:  Katherina Right, MD  Patient Profile  78 year old male with history of chronic dyspnea who presents for followup.  Problem List   Past Medical History  Diagnosis Date  . Coronary artery disease     a.  s/p CABG;  b. NSTEMI 9/10:  LHC with S-RCA ok, S-D1 occluded (chronic), S-D3 occluded, L-LAD ok;  PCI:  Endeavour DES to S-D3;  c. 10/2011 Lexiscan MV EF 37%, medium sized fixed defect over basilar to mid lateral wall.  No ishcemia.   . Lung disease, restrictive   . History of PFTs 01/20/2009  . Pleural effusion   . Chronic renal insufficiency   . CHF (congestive heart failure)     a. Previous EF 40-50%;  b. Echo 08/2011: mild LVH, EF 55-65, grade 3 diast dysfxn, mild AS (mean 19), mild AI, mild BAE  . Hypertension   . Dyslipidemia   . Aortic stenosis     a. echo 4/13: mild AS with mean 19 mmHg  . Atrial fibrillation     a. Chronic coumadin  . Anemia   . Difficult intubation   . Shortness of breath   . Arthritis    Past Surgical History  Procedure Date  . Coronary artery bypass graft 1999  . Coronary angioplasty with stent placement     OF THE SAPHENOUS VEIN GRAFT TO THE THIRD DIAGONAL.  HE HAS MODERATE TO SEVERE STENOSIS REMAINING ON HIS NATIVE LEFT CIRCUMFLEX PROXIMAL VESSEL  . Back surgery   . Hernia repair   . Dccv   . Cataract extraction     bilateral    Allergies  No Known Allergies  HPI  78 year old male with the above problem list.  I last saw him in clinic earlier this month at which time he reported dyspnea on exertion in the setting of stable volume and weight.  He underwent lexiscan myoview on June 11 which showed no evidence of ischemia with suggestion of basilar to mid lateral wall infarct and an EF of 37%.  Of note, LV function was normal on recent echocardiogram in April of this year.  Patient continues to report dyspnea on  exertion which really hasn't changed in several months.  He has not had any chest pain.  His weight was fairly steady at approximately 241 pounds at home but he and his wife went to the beach last weekend and report that they mostly sat around and take more than usual.  He continues to avoid salt.  Since coming home from the beach his weight increased to 245 pounds on his home scale.  He has not had any worsening of dyspnea with this weight gain.  He has stable two-pillow orthopnea.  He denies PND, early satiety, dizziness, or syncope.  He has chronic bilateral lower extremity edema and this has been stable.  Home Medications  Prior to Admission medications   Medication Sig Start Date End Date Taking? Authorizing Provider  acetaminophen (TYLENOL) 500 MG tablet Take 500 mg by mouth every 6 (six) hours as needed. For pain   Yes Historical Provider, MD  amLODipine (NORVASC) 2.5 MG tablet Take 1 tablet (2.5 mg total) by mouth daily. 10/23/11 10/22/12 Yes Ok Anis, NP  aspirin 81 MG tablet Take 81 mg by mouth daily.     Yes Historical Provider, MD  brimonidine-timolol (COMBIGAN) 0.2-0.5 % ophthalmic solution Place 1 drop into the right  eye every 12 (twelve) hours.   Yes Historical Provider, MD  Cholecalciferol (VITAMIN D-3) 5000 UNITS TABS Take 1 tablet by mouth daily.    Yes Historical Provider, MD  clopidogrel (PLAVIX) 75 MG tablet TAKE 1 TABLET BY MOUTH EVERY DAY 02/20/11  Yes Vesta Mixer, MD  CRESTOR 10 MG tablet TAKE 1 TABELT BY MOUTH EVERY DAY 03/20/11  Yes Vesta Mixer, MD  doxazosin (CARDURA) 4 MG tablet Take 1 tablet (4 mg total) by mouth at bedtime. 02/02/11  Yes Vesta Mixer, MD  famotidine (PEPCID) 10 MG tablet Take 10 mg by mouth 2 (two) times daily as needed. For acid   Yes Historical Provider, MD  Ferrous Gluconate (IRON) 240 (27 FE) MG TABS Take 1 tablet by mouth daily.   Yes Historical Provider, MD  fish oil-omega-3 fatty acids 1000 MG capsule Take 1 g by mouth daily.     Yes Historical Provider, MD  furosemide (LASIX) 40 MG tablet STARTING 10/10/11 taking 60 MG in morning and 40 mg in evening 10/09/11  Yes Vesta Mixer, MD  hydroxychloroquine (PLAQUENIL) 200 MG tablet Take 200 mg by mouth 2 (two) times daily.    Yes Historical Provider, MD  metoprolol (LOPRESSOR) 50 MG tablet Take 1 tablet (50 mg total) by mouth 2 (two) times daily. 09/13/11  Yes Vesta Mixer, MD  traMADol (ULTRAM) 50 MG tablet Take 50 mg by mouth every 6 (six) hours as needed. For pain   Yes Historical Provider, MD  warfarin (COUMADIN) 5 MG tablet Take 5 mg by mouth. Take as Directed   Yes Historical Provider, MD  metoprolol (LOPRESSOR) 50 MG tablet Take 1 tablet (50 mg total) by mouth 2 (two) times daily. 09/05/10 09/05/11  Vesta Mixer, MD    Review of Systems  Chronic dyspnea on exertion with minimal activity as outlined above.  He denies chest pain.  His bilateral knee swelling without pain which limits his ambulation.  All other systems reviewed and are otherwise negative except as noted above.  Physical Exam  Blood pressure 119/72, pulse 72, weight 251 lb (113.853 kg), SpO2 96.00%.  General: Pleasant, NAD Psych: Normal affect. Neuro: Alert and oriented X 3. Moves all extremities spontaneously. HEENT: Normal  Neck: Supple without bruits or JVD. Lungs:  Resp regular and unlabored, CTA. Heart: RRR no s3, s4. 2/6 SEM RUSB. Abdomen: Soft, non-tender, non-distended, BS + x 4.  Extremities: No clubbing, cyanosis.  1+ edema to the knees bilat. DP/PT/Radials 2+ and equal bilaterally.  Assessment & Plan  1.  Chronic dyspnea on exertion: Likely multifactorial.  His weight is up slightly in the setting of recent vacation where he says he was especially inactive and was eating quite a bit.  On exam, his volume appears to be stable.  I discussed his case with Dr. Elease Hashimoto who also talked with Mr. Pilar.  We will continue current medical regimen.  We've recommended continued limitation of  sodium intake, use of support hose, and elevation of legs.  We'll make no changes to his medical regimen today.  We would like to see him more active as deconditioning is likely playing a large role in his symptoms.  2.  Coronary artery disease: Patient with recent Myoview which was nonischemic.  His EF was down though it should be noted that LV function was normal on recent echo in April.  His symptoms have not changed in several months and he has not had any chest pain.  Continue current medical  therapy and we'll consider repeating echo to reconfirm LV function in the future.  3.  Chronic diastolic CHF: Normal LV function by echo in April as above.  LV function appeared reduced on recent Myoview.  Consider repeat echo upon followup.  Volume appears relatively stable.  We'll continue his current diuretic dose.  Blood pressure and heart rate are improved compared to when I last saw him.  4.  Hypertension: Improved.  5.  Aortic stenosis: Mild on last echo.  6.  Chronic kidney disease: Stable by basic metabolic panel earlier this month.  7.  Disposition: He has followed with Dr. Elease Hashimoto her in August.  Nicolasa Ducking, NP 11/15/2011, 11:32 AM

## 2011-11-15 NOTE — Patient Instructions (Addendum)
Elevate legs daily as ordered by Dr. Elease Hashimoto.  Keep August appt with Dr. Elease Hashimoto.

## 2011-11-20 ENCOUNTER — Encounter: Payer: Self-pay | Admitting: Cardiovascular Disease

## 2011-11-28 ENCOUNTER — Ambulatory Visit (INDEPENDENT_AMBULATORY_CARE_PROVIDER_SITE_OTHER): Payer: Medicare Other | Admitting: *Deleted

## 2011-11-28 DIAGNOSIS — I4891 Unspecified atrial fibrillation: Secondary | ICD-10-CM

## 2011-11-28 LAB — POCT INR: INR: 2.3

## 2011-12-28 ENCOUNTER — Ambulatory Visit (INDEPENDENT_AMBULATORY_CARE_PROVIDER_SITE_OTHER): Payer: Medicare Other | Admitting: Pharmacist

## 2011-12-28 DIAGNOSIS — I4891 Unspecified atrial fibrillation: Secondary | ICD-10-CM

## 2012-01-01 ENCOUNTER — Other Ambulatory Visit: Payer: Self-pay | Admitting: Pharmacist

## 2012-01-01 MED ORDER — WARFARIN SODIUM 5 MG PO TABS: ORAL_TABLET | ORAL | Status: DC

## 2012-01-08 ENCOUNTER — Ambulatory Visit (INDEPENDENT_AMBULATORY_CARE_PROVIDER_SITE_OTHER): Payer: Medicare Other | Admitting: Cardiovascular Disease

## 2012-01-08 ENCOUNTER — Encounter: Payer: Self-pay | Admitting: Cardiovascular Disease

## 2012-01-08 VITALS — BP 158/72 | HR 56 | Ht 66.0 in | Wt 237.8 lb

## 2012-01-08 DIAGNOSIS — I5032 Chronic diastolic (congestive) heart failure: Secondary | ICD-10-CM

## 2012-01-08 DIAGNOSIS — I359 Nonrheumatic aortic valve disorder, unspecified: Secondary | ICD-10-CM

## 2012-01-08 DIAGNOSIS — I10 Essential (primary) hypertension: Secondary | ICD-10-CM

## 2012-01-08 DIAGNOSIS — I2510 Atherosclerotic heart disease of native coronary artery without angina pectoris: Secondary | ICD-10-CM

## 2012-01-08 DIAGNOSIS — E785 Hyperlipidemia, unspecified: Secondary | ICD-10-CM

## 2012-01-08 LAB — LIPID PANEL
LDL Cholesterol: 27 mg/dL (ref 0–99)
Total CHOL/HDL Ratio: 2
VLDL: 21 mg/dL (ref 0.0–40.0)

## 2012-01-08 LAB — BASIC METABOLIC PANEL
BUN: 28 mg/dL — ABNORMAL HIGH (ref 6–23)
Chloride: 109 mEq/L (ref 96–112)
Creatinine, Ser: 2 mg/dL — ABNORMAL HIGH (ref 0.4–1.5)
Glucose, Bld: 103 mg/dL — ABNORMAL HIGH (ref 70–99)
Potassium: 3.7 mEq/L (ref 3.5–5.1)

## 2012-01-08 LAB — HEPATIC FUNCTION PANEL
Alkaline Phosphatase: 47 U/L (ref 39–117)
Bilirubin, Direct: 0 mg/dL (ref 0.0–0.3)
Total Bilirubin: 0.5 mg/dL (ref 0.3–1.2)
Total Protein: 6.5 g/dL (ref 6.0–8.3)

## 2012-01-08 MED ORDER — FUROSEMIDE 40 MG PO TABS: ORAL_TABLET | ORAL | Status: DC

## 2012-01-08 NOTE — Patient Instructions (Addendum)
Your physician recommends that you schedule a follow-up appointment in: 3 MONTH  Your physician recommends that you return for a FASTING lipid profile: TODAY  Your physician recommends that you return for lab work in: 3 MONTHS //BMET NO FASTING      .

## 2012-01-08 NOTE — Assessment & Plan Note (Signed)
We'll continue his current medications. He's using the lounge Dr. leg rest and his leg edema is markedly better. We will will refill his Lasix.

## 2012-01-08 NOTE — Assessment & Plan Note (Signed)
Alex Orozco seems to be doing fairly well. He's not having any significant episodes of angina. His Myoview study did show some areas of attenuation and scar in the lateral wall. This seems to correspond with his known coronary artery disease from previous cardiac catheterization.  Given his recent creatinine of 2.1 I would not want to do a cardiac catheterization unless he had a very high-risk Myoview study or was very symptomatic.  We'll see him again in 3 months for followup office visit.

## 2012-01-08 NOTE — Assessment & Plan Note (Signed)
Stable

## 2012-01-08 NOTE — Progress Notes (Signed)
[name] Date of Birth  [birth year]       [facility]    [place] [address]  [address] [place], [place]  [address]   [place], [place]  [address] [phone]     [phone]   Fax  [phone]    Fax [phone]  Problem List: 1. Coronary artery disease- s/p CABG, s/p stenting of SVG to D3. He has stenosis of native LCx. 2. Restrictive lung disease 3. Chronic renal insufficiency 4. Congestive heart failure-ejection fraction of 55-60%  by echo.  There is evidence of diastolic dysfunction. 5. Hypertension 6. Dyslipidemia 7. Moderate aortic stenosis A. Atrial fibrillation 9. History of strep bacteremia 10. History of anemia   History of Present Illness: He was  admitted in April, 2013 with an episode of pneumonia and systolic congestive heart failure.  He was discharge with Levofloxacin, steroid taper, and nebs.  He saw Tereso Newcomer in his Lasix dose was increased to 60 mg twice a day. He had acute worsening of his renal function with a creatinine up to 2.0. His Lasix was  Held for several doses. Currently he is on a Lasix 60 mg in the AM and 40 mg in the evening.  He seems to be doing well with this dose.  He is feeling OK now.  He complains of generalized fatigue.    Current Outpatient Prescriptions on File Prior to Visit  Medication Sig Dispense Refill  . amLODipine (NORVASC) 2.5 MG tablet Take 1 tablet (2.5 mg total) by mouth daily.  90 tablet  1  . aspirin 81 MG tablet Take 81 mg by mouth daily.        . brimonidine-timolol (COMBIGAN) 0.2-0.5 % ophthalmic solution Place 1 drop into the right eye every 12 (twelve) hours.      . Cholecalciferol (VITAMIN D-3) 5000 UNITS TABS Take 1 tablet by mouth daily.       . clopidogrel (PLAVIX) 75 MG tablet TAKE 1 TABLET BY MOUTH EVERY DAY  90 tablet  3  . CRESTOR 10 MG tablet TAKE 1 TABELT BY MOUTH EVERY DAY  90 tablet  3  . doxazosin (CARDURA) 4 MG tablet Take 1 tablet (4 mg  total) by mouth at bedtime.  30 tablet  11  . famotidine (PEPCID) 10 MG tablet Take 10 mg by mouth 2 (two) times daily as needed. For acid      . Ferrous Gluconate (IRON) 240 (27 FE) MG TABS Take 1 tablet by mouth daily.      . fish oil-omega-3 fatty acids 1000 MG capsule Take 1 g by mouth daily.       . furosemide (LASIX) 40 MG tablet STARTING 10/10/11 taking 60 MG in morning and 40 mg in evening  90 tablet  1  . hydroxychloroquine (PLAQUENIL) 200 MG tablet Take 200 mg by mouth 2 (two) times daily.       . metoprolol (LOPRESSOR) 50 MG tablet Take 1 tablet (50 mg total) by mouth 2 (two) times daily.  60 tablet  6  . traMADol (ULTRAM) 50 MG tablet Take 50 mg by mouth every 6 (six) hours as needed. For pain      . warfarin (COUMADIN) 5 MG tablet Take as Directed by [facility]  90 tablet  0  . DISCONTD: metoprolol (LOPRESSOR) 50 MG tablet Take 1 tablet (50 mg total) by mouth 2 (two) times daily.  60 tablet  11    No  Known Allergies  Past Medical History  Diagnosis Date  . Coronary artery disease     a.  s/p CABG;  b. NSTEMI 9/10:  LHC with S-RCA ok, S-D1 occluded (chronic), S-D3 occluded, L-LAD ok;  PCI:  Endeavour DES to S-D3;  c. 10/2011 Lexiscan MV EF 37%, medium sized fixed defect over basilar to mid lateral wall.  No ishcemia.   . Lung disease, restrictive   . History of PFTs 01/20/2009  . Pleural effusion   . Chronic renal insufficiency   . CHF (congestive heart failure)     a. Previous EF 40-50%;  b. Echo 08/2011: mild LVH, EF 55-65, grade 3 diast dysfxn, mild AS (mean 19), mild AI, mild BAE  . Hypertension   . Dyslipidemia   . Aortic stenosis     a. echo 4/13: mild AS with mean 19 mmHg  . Atrial fibrillation     a. Chronic coumadin  . Anemia   . Difficult intubation   . Shortness of breath   . Arthritis     Past Surgical History  Procedure Date  . Coronary artery bypass graft 1999  . Coronary angioplasty with stent placement     OF THE SAPHENOUS VEIN GRAFT TO THE THIRD  DIAGONAL.  HE HAS MODERATE TO SEVERE STENOSIS REMAINING ON HIS NATIVE LEFT CIRCUMFLEX PROXIMAL VESSEL  . Back surgery   . Hernia repair   . Dccv   . Cataract extraction     bilateral    History  Smoking status  . Never Smoker   Smokeless tobacco  . Never Used    History  Alcohol Use No    Family History  Problem Relation Age of Onset  . Heart failure Mother   . Heart attack Father   . Hypertension Father     Reviw of Systems:  Reviewed in the HPI.  All other systems are negative.  Physical Exam: Blood pressure 158/72, pulse 56, height 5\' 6"  (1.676 m), weight 237 lb 12.8 oz (107.865 kg). General: Well developed, well nourished, in no acute distress.  Head: Normocephalic, atraumatic, sclera non-icteric, mucus membranes are moist,   Neck: Supple. Carotids are 2 + without bruits. No JVD  Lungs: Clear bilaterally to auscultation.  Heart: regular rate.  normal  S1 S2. Soft systolic murmur  Abdomen: Soft, non-tender, non-distended with normal bowel sounds. No hepatomegaly. No rebound/guarding. No masses.  Msk:  Strength and tone are normal  Extremities: No clubbing or cyanosis. No significant  leg edema.  Unchanged from previous exams  Neuro: Alert and oriented X 3. Moves all extremities spontaneously.  Psych:  Responds to questions appropriately with a normal affect.  ECG:  Assessment / Plan:

## 2012-01-25 ENCOUNTER — Ambulatory Visit (INDEPENDENT_AMBULATORY_CARE_PROVIDER_SITE_OTHER): Payer: Medicare Other | Admitting: Pharmacist

## 2012-01-25 DIAGNOSIS — I4891 Unspecified atrial fibrillation: Secondary | ICD-10-CM

## 2012-01-25 LAB — POCT INR: INR: 2.9

## 2012-01-31 ENCOUNTER — Other Ambulatory Visit: Payer: Self-pay | Admitting: *Deleted

## 2012-01-31 MED ORDER — DOXAZOSIN MESYLATE 4 MG PO TABS: 4.0000 mg | ORAL_TABLET | Freq: Every day | ORAL | Status: DC

## 2012-01-31 NOTE — Telephone Encounter (Signed)
Fax Received. Refill Completed. Alex Orozco (R.M.A)   

## 2012-02-21 ENCOUNTER — Other Ambulatory Visit: Payer: Self-pay | Admitting: *Deleted

## 2012-02-21 MED ORDER — CLOPIDOGREL BISULFATE 75 MG PO TABS: 75.0000 mg | ORAL_TABLET | Freq: Every day | ORAL | Status: DC

## 2012-02-21 NOTE — Telephone Encounter (Signed)
Fax Received. Refill Completed. Alex Orozco (R.M.A)   

## 2012-03-07 ENCOUNTER — Ambulatory Visit (INDEPENDENT_AMBULATORY_CARE_PROVIDER_SITE_OTHER): Payer: Medicare Other

## 2012-03-07 DIAGNOSIS — I4891 Unspecified atrial fibrillation: Secondary | ICD-10-CM

## 2012-03-07 LAB — POCT INR: INR: 2.4

## 2012-03-19 ENCOUNTER — Other Ambulatory Visit: Payer: Self-pay | Admitting: *Deleted

## 2012-03-19 MED ORDER — ROSUVASTATIN CALCIUM 10 MG PO TABS: 10.0000 mg | ORAL_TABLET | Freq: Every day | ORAL | Status: DC

## 2012-04-09 ENCOUNTER — Other Ambulatory Visit: Payer: Self-pay | Admitting: *Deleted

## 2012-04-09 MED ORDER — METOPROLOL TARTRATE 50 MG PO TABS: 50.0000 mg | ORAL_TABLET | Freq: Two times a day (BID) | ORAL | Status: DC

## 2012-04-10 ENCOUNTER — Ambulatory Visit (INDEPENDENT_AMBULATORY_CARE_PROVIDER_SITE_OTHER): Payer: Medicare Other | Admitting: Cardiovascular Disease

## 2012-04-10 ENCOUNTER — Encounter: Payer: Self-pay | Admitting: Cardiovascular Disease

## 2012-04-10 VITALS — BP 138/80 | HR 59 | Ht 66.0 in | Wt 237.1 lb

## 2012-04-10 DIAGNOSIS — I10 Essential (primary) hypertension: Secondary | ICD-10-CM

## 2012-04-10 DIAGNOSIS — I2510 Atherosclerotic heart disease of native coronary artery without angina pectoris: Secondary | ICD-10-CM

## 2012-04-10 DIAGNOSIS — I5032 Chronic diastolic (congestive) heart failure: Secondary | ICD-10-CM

## 2012-04-10 LAB — BASIC METABOLIC PANEL
CO2: 30 mEq/L (ref 19–32)
Chloride: 103 mEq/L (ref 96–112)
Creatinine, Ser: 2.2 mg/dL — ABNORMAL HIGH (ref 0.4–1.5)
Potassium: 3.4 mEq/L — ABNORMAL LOW (ref 3.5–5.1)

## 2012-04-10 NOTE — Assessment & Plan Note (Signed)
His blood pressure is well-controlled 

## 2012-04-10 NOTE — Assessment & Plan Note (Signed)
Lyn seems to be doing well. He continues to be somewhat limited but his peripheral edema is well-controlled. He continues to use the lounge Dr. leg rest.  He sleeps with CPAP. He is overall doing just fine.  We'll continue with the same medications. I'll see him in 6 months.

## 2012-04-10 NOTE — Patient Instructions (Addendum)
Your physician recommends that you return for lab work in: TODAY, BMET  Your physician wants you to follow-up in:6 MONTHS  You will receive a reminder letter in the mail two months in advance. If you don't receive a letter, please call our office to schedule the follow-up appointment.  Your physician recommends that you return for a FASTING lipid profile:  1 YEAR

## 2012-04-10 NOTE — Progress Notes (Signed)
Alex Orozco Date of Birth  Oct 19, 1929       [facility]    [place] [address]  [address] [place], [place]  [address]   [place], [place]  [address] [phone]     [phone]   Fax  [phone]    Fax [phone]  Problem List: 1. Coronary artery disease- s/p CABG, s/p stenting of SVG to D3. He has stenosis of native LCx. 2. Restrictive lung disease 3. Chronic renal insufficiency 4. Congestive heart failure-ejection fraction of 55-60%  by echo.  There is evidence of diastolic dysfunction. 5. Hypertension 6. Dyslipidemia 7. Moderate aortic stenosis A. Atrial fibrillation 9. History of strep bacteremia 10. History of anemia   History of Present Illness: He was  admitted in April, 2013 with an episode of pneumonia and systolic congestive heart failure.  He was discharge with Levofloxacin, steroid taper, and nebs.  He saw Tereso Newcomer in his Lasix dose was increased to 60 mg twice a day. He had acute worsening of his renal function with a creatinine up to 2.0. His Lasix was  Held for several doses. Currently he is on a Lasix 60 mg in the AM and 40 mg in the evening.  He seems to be doing well with this dose.  He is feeling OK now.  He complains of generalized fatigue.   He has had 5 injections of"rooster comb"  into his left knee  No angina, breathing is ok.  Wearing his CPAP at night. Using the "Lounge Doctor" regularly.   Current Outpatient Prescriptions on File Prior to Visit  Medication Sig Dispense Refill  . amLODipine (NORVASC) 2.5 MG tablet Take 1 tablet (2.5 mg total) by mouth daily.  90 tablet  1  . aspirin 81 MG tablet Take 81 mg by mouth daily.        . brimonidine-timolol (COMBIGAN) 0.2-0.5 % ophthalmic solution Place 1 drop into the right eye every 12 (twelve) hours.      . Cholecalciferol (VITAMIN D-3) 5000 UNITS TABS Take 1 tablet by mouth daily.       . clopidogrel (PLAVIX) 75 MG tablet Take 1 tablet (75 mg  total) by mouth daily.  90 tablet  3  . doxazosin (CARDURA) 4 MG tablet Take 1 tablet (4 mg total) by mouth at bedtime.  30 tablet  5  . famotidine (PEPCID) 10 MG tablet Take 10 mg by mouth 2 (two) times daily as needed. For acid      . Ferrous Gluconate (IRON) 240 (27 FE) MG TABS Take 1 tablet by mouth daily.      . fish oil-omega-3 fatty acids 1000 MG capsule Take 1 g by mouth daily.       . furosemide (LASIX) 40 MG tablet STARTING 10/10/11 taking 60 MG in morning and 40 mg in evening  225 tablet  3  . hydroxychloroquine (PLAQUENIL) 200 MG tablet Take 200 mg by mouth 2 (two) times daily.       . metoprolol (LOPRESSOR) 50 MG tablet Take 1 tablet (50 mg total) by mouth 2 (two) times daily.  60 tablet  6  . rosuvastatin (CRESTOR) 10 MG tablet Take 1 tablet (10 mg total) by mouth daily.  90 tablet  3  . traMADol (ULTRAM) 50 MG tablet Take 50 mg by mouth every 6 (six) hours as needed. For pain      . warfarin (COUMADIN) 5 MG tablet Take as Directed by Coumadin  Clinic  90 tablet  0  . [DISCONTINUED] furosemide (LASIX) 40 MG tablet STARTING 10/10/11 taking 60 MG in morning and 40 mg in evening  90 tablet  1    No Known Allergies  Past Medical History  Diagnosis Date  . Coronary artery disease     a.  s/p CABG;  b. NSTEMI 9/10:  LHC with S-RCA ok, S-D1 occluded (chronic), S-D3 occluded, L-LAD ok;  PCI:  Endeavour DES to S-D3;  c. 10/2011 Lexiscan MV EF 37%, medium sized fixed defect over basilar to mid lateral wall.  No ishcemia.   . Lung disease, restrictive   . History of PFTs 01/20/2009  . Pleural effusion   . Chronic renal insufficiency   . CHF (congestive heart failure)     a. Previous EF 40-50%;  b. Echo 08/2011: mild LVH, EF 55-65, grade 3 diast dysfxn, mild AS (mean 19), mild AI, mild BAE  . Hypertension   . Dyslipidemia   . Aortic stenosis     a. echo 4/13: mild AS with mean 19 mmHg  . Atrial fibrillation     a. Chronic coumadin  . Anemia   . Difficult intubation   . Shortness of  breath   . Arthritis     Past Surgical History  Procedure Date  . Coronary artery bypass graft 1999  . Coronary angioplasty with stent placement     OF THE SAPHENOUS VEIN GRAFT TO THE THIRD DIAGONAL.  HE HAS MODERATE TO SEVERE STENOSIS REMAINING ON HIS NATIVE LEFT CIRCUMFLEX PROXIMAL VESSEL  . Back surgery   . Hernia repair   . Dccv   . Cataract extraction     bilateral    History  Smoking status  . Never Smoker   Smokeless tobacco  . Never Used    History  Alcohol Use No    Family History  Problem Relation Age of Onset  . Heart failure Mother   . Heart attack Father   . Hypertension Father     Reviw of Systems:  Reviewed in the HPI.  All other systems are negative.  Physical Exam: Blood pressure 138/80, pulse 59, height 5\' 6"  (1.676 m), weight 237 lb 1.9 oz (107.557 kg), SpO2 95.00%. General: Well developed, well nourished, in no acute distress.  Head: Normocephalic, atraumatic, sclera non-icteric, mucus membranes are moist,   Neck: Supple. Carotids are 2 + without bruits. No JVD  Lungs: Clear bilaterally to auscultation.  Heart: regular rate.  normal  S1 S2. Soft systolic murmur  Abdomen: Soft, non-tender, non-distended with normal bowel sounds. No hepatomegaly. No rebound/guarding. No masses.  Msk:  Strength and tone are normal  Extremities: No clubbing or cyanosis. No significant  leg edema.  Unchanged from previous exams  Neuro: Alert and oriented X 3. Moves all extremities spontaneously.  Psych:  Responds to questions appropriately with a normal affect.  ECG:  Assessment / Plan:

## 2012-04-22 ENCOUNTER — Encounter: Payer: Self-pay | Admitting: Cardiovascular Disease

## 2012-04-23 ENCOUNTER — Ambulatory Visit (INDEPENDENT_AMBULATORY_CARE_PROVIDER_SITE_OTHER): Payer: Medicare Other

## 2012-04-23 DIAGNOSIS — I4891 Unspecified atrial fibrillation: Secondary | ICD-10-CM

## 2012-04-23 LAB — POCT INR: INR: 2.9

## 2012-04-24 ENCOUNTER — Other Ambulatory Visit: Payer: Self-pay | Admitting: *Deleted

## 2012-04-24 MED ORDER — AMLODIPINE BESYLATE 2.5 MG PO TABS: 2.5000 mg | ORAL_TABLET | Freq: Every day | ORAL | Status: DC

## 2012-04-24 NOTE — Telephone Encounter (Signed)
Fax Received. Refill Completed. Alex Orozco (R.M.A)   

## 2012-05-03 ENCOUNTER — Ambulatory Visit (INDEPENDENT_AMBULATORY_CARE_PROVIDER_SITE_OTHER): Payer: Medicare Other | Admitting: *Deleted

## 2012-05-03 DIAGNOSIS — I4891 Unspecified atrial fibrillation: Secondary | ICD-10-CM

## 2012-05-03 LAB — POCT INR: INR: 2.7

## 2012-05-13 ENCOUNTER — Other Ambulatory Visit: Payer: Self-pay | Admitting: *Deleted

## 2012-05-13 MED ORDER — WARFARIN SODIUM 5 MG PO TABS: ORAL_TABLET | ORAL | Status: DC

## 2012-06-14 ENCOUNTER — Ambulatory Visit (INDEPENDENT_AMBULATORY_CARE_PROVIDER_SITE_OTHER): Payer: Medicare Other | Admitting: *Deleted

## 2012-06-14 DIAGNOSIS — I4891 Unspecified atrial fibrillation: Secondary | ICD-10-CM

## 2012-06-19 ENCOUNTER — Ambulatory Visit: Payer: Medicare Other | Admitting: Pulmonary Disease

## 2012-07-01 ENCOUNTER — Ambulatory Visit (INDEPENDENT_AMBULATORY_CARE_PROVIDER_SITE_OTHER): Payer: Medicare Other | Admitting: Pulmonary Disease

## 2012-07-01 ENCOUNTER — Encounter: Payer: Self-pay | Admitting: Pulmonary Disease

## 2012-07-01 VITALS — BP 128/72 | HR 56 | Temp 97.8°F | Ht 68.0 in | Wt 240.4 lb

## 2012-07-01 DIAGNOSIS — G4733 Obstructive sleep apnea (adult) (pediatric): Secondary | ICD-10-CM

## 2012-07-01 NOTE — Patient Instructions (Addendum)
Stay on cpap, and keep up with mask cushions and supplies Work on weight loss followup with me in one year.

## 2012-07-01 NOTE — Progress Notes (Signed)
  Subjective:    Patient ID: Alex Orozco, male    DOB: [birth year], 79 y.o.   MRN: [id number]  HPI The patient comes in today for followup of his obstructive sleep apnea.  He is wearing CPAP compliantly, and has found a mask that fits well for him.  He is having no issues with the pressure, and his wife denies hearing breakthrough snoring.  He feels that he sleeps well during the night, and is satisfied with his daytime alertness.  Of note, his weight is down 6 pounds since last visit.   Review of Systems  Constitutional: Negative for fever and unexpected weight change.  HENT: Negative for ear pain, nosebleeds, congestion, sore throat, rhinorrhea, sneezing, trouble swallowing, dental problem, postnasal drip and sinus pressure.   Eyes: Negative for redness and itching.  Respiratory: Negative for cough, chest tightness, shortness of breath and wheezing.   Cardiovascular: Negative for palpitations and leg swelling.  Gastrointestinal: Negative for nausea and vomiting.  Genitourinary: Negative for dysuria.  Musculoskeletal: Positive for joint swelling ( knee pain/swelling--gets injections for RA) and arthralgias.  Skin: Negative for rash.  Neurological: Negative for headaches.  Hematological: Does not bruise/bleed easily.  Psychiatric/Behavioral: Negative for dysphoric mood. The patient is not nervous/anxious.        Objective:   Physical Exam Overweight male in no acute distress Nose without purulence or discharge noted No skin breakdown or pressure necrosis from the CPAP mask Lower extremities with mild edema, no cyanosis Alert and oriented, does not appear to be sleepy, moves all 4 extremities.       Assessment & Plan:

## 2012-07-01 NOTE — Assessment & Plan Note (Signed)
The patient is wearing CPAP compliantly, and is having no issues with mask tolerance or pressure.  He is satisfied with his sleep and daytime alertness.  I have asked him to work on weight loss aggressively, and to followup with me in one year.  He also needs to keep up with his supplies and mask cushion changes.

## 2012-07-25 ENCOUNTER — Ambulatory Visit (INDEPENDENT_AMBULATORY_CARE_PROVIDER_SITE_OTHER): Payer: Medicare Other | Admitting: *Deleted

## 2012-07-25 LAB — POCT INR: INR: 2.5

## 2012-08-01 ENCOUNTER — Encounter: Payer: Self-pay | Admitting: Cardiovascular Disease

## 2012-08-02 ENCOUNTER — Other Ambulatory Visit: Payer: Self-pay | Admitting: *Deleted

## 2012-08-02 MED ORDER — DOXAZOSIN MESYLATE 4 MG PO TABS: 4.0000 mg | ORAL_TABLET | Freq: Every day | ORAL | Status: DC

## 2012-08-02 NOTE — Telephone Encounter (Signed)
Fax Received. Refill Completed. Octavia Chowoe (R.M.A)   

## 2012-09-05 ENCOUNTER — Ambulatory Visit (INDEPENDENT_AMBULATORY_CARE_PROVIDER_SITE_OTHER): Payer: Medicare Other | Admitting: *Deleted

## 2012-09-05 DIAGNOSIS — I4891 Unspecified atrial fibrillation: Secondary | ICD-10-CM

## 2012-09-05 LAB — POCT INR: INR: 1.9

## 2012-10-02 ENCOUNTER — Other Ambulatory Visit: Payer: Self-pay | Admitting: *Deleted

## 2012-10-02 MED ORDER — WARFARIN SODIUM 5 MG PO TABS: ORAL_TABLET | ORAL | Status: DC

## 2012-10-07 ENCOUNTER — Encounter: Payer: Self-pay | Admitting: Cardiovascular Disease

## 2012-10-07 ENCOUNTER — Ambulatory Visit (INDEPENDENT_AMBULATORY_CARE_PROVIDER_SITE_OTHER): Payer: Medicare Other | Admitting: Cardiovascular Disease

## 2012-10-07 ENCOUNTER — Ambulatory Visit (INDEPENDENT_AMBULATORY_CARE_PROVIDER_SITE_OTHER): Payer: Medicare Other

## 2012-10-07 VITALS — BP 125/78 | HR 62 | Ht 68.0 in | Wt 242.1 lb

## 2012-10-07 DIAGNOSIS — E785 Hyperlipidemia, unspecified: Secondary | ICD-10-CM

## 2012-10-07 DIAGNOSIS — I4891 Unspecified atrial fibrillation: Secondary | ICD-10-CM

## 2012-10-07 DIAGNOSIS — I359 Nonrheumatic aortic valve disorder, unspecified: Secondary | ICD-10-CM

## 2012-10-07 DIAGNOSIS — I5032 Chronic diastolic (congestive) heart failure: Secondary | ICD-10-CM

## 2012-10-07 DIAGNOSIS — I2510 Atherosclerotic heart disease of native coronary artery without angina pectoris: Secondary | ICD-10-CM

## 2012-10-07 NOTE — Progress Notes (Signed)
[name] Date of Birth  04-18-1930       [facility]    [place] [address], [address]  [address], [address] [place], [place]  [address]   [place], [place]  [address] [phone]     [phone]   Fax  [phone]    Fax [phone]  Problem List: 1. Coronary artery disease- s/p CABG, s/p stenting of SVG to D3. He has stenosis of native LCx. 2. Restrictive lung disease 3. Chronic renal insufficiency 4. Congestive heart failure-ejection fraction of 55-60%  by echo.  There is evidence of diastolic dysfunction. 5. Hypertension 6. Dyslipidemia 7. Moderate aortic stenosis A. Atrial fibrillation 9. History of strep bacteremia 10. History of anemia   History of Present Illness: He was  admitted in April, 2013 with an episode of pneumonia and systolic congestive heart failure.  He was discharge with Levofloxacin, steroid taper, and nebs.  He saw Tereso Newcomer in his Lasix dose was increased to 60 mg twice a day. He had acute worsening of his renal function with a creatinine up to 2.0. His Lasix was  Held for several doses. Currently he is on a Lasix 60 mg in the AM and 40 mg in the evening.  He seems to be doing well with this dose.  He is feeling OK now.  He complains of generalized fatigue.   He has had 5 injections of"rooster comb"  into his left knee  No angina, breathing is ok.  Wearing his CPAP at night. Using the "Lounge Doctor" regularly.  May 19, 20014:  [name] is doing OK.  He still eats salt.   BP is OK.    Current Outpatient Prescriptions on File Prior to Visit  Medication Sig Dispense Refill  . amLODipine (NORVASC) 2.5 MG tablet Take 1 tablet (2.5 mg total) by mouth daily.  90 tablet  3  . aspirin 81 MG tablet Take 81 mg by mouth daily.        . Cholecalciferol (VITAMIN D-3) 5000 UNITS TABS Take 1 tablet by mouth daily.       . clopidogrel (PLAVIX) 75 MG tablet Take 1 tablet (75 mg total) by mouth daily.  90 tablet  3  . colchicine  0.6 MG tablet Take 0.6 mg by mouth as needed.       . doxazosin (CARDURA) 4 MG tablet Take 1 tablet (4 mg total) by mouth at bedtime.  30 tablet  5  . famotidine (PEPCID) 10 MG tablet Take 10 mg by mouth 2 (two) times daily as needed. For acid      . febuxostat (ULORIC) 40 MG tablet Take 80 mg by mouth as needed.       . Ferrous Gluconate (IRON) 240 (27 FE) MG TABS Take 1 tablet by mouth daily.      . fish oil-omega-3 fatty acids 1000 MG capsule Take 1 g by mouth daily.       . furosemide (LASIX) 40 MG tablet STARTING 10/10/11 taking 60 MG in morning and 40 mg in evening  225 tablet  3  . hydroxychloroquine (PLAQUENIL) 200 MG tablet Take 200 mg by mouth 2 (two) times daily.       . metoprolol (LOPRESSOR) 50 MG tablet Take 1 tablet (50 mg total) by mouth 2 (two) times daily.  60 tablet  6  . rosuvastatin (CRESTOR) 10 MG tablet Take 1 tablet (10 mg total) by mouth daily.  90 tablet  3  .  traMADol (ULTRAM) 50 MG tablet Take 50 mg by mouth every 6 (six) hours as needed. For pain      . warfarin (COUMADIN) 5 MG tablet Take as Directed by [facility]  90 tablet  0   No current facility-administered medications on file prior to visit.    No Known Allergies  Past Medical History  Diagnosis Date  . Coronary artery disease     a.  s/p CABG;  b. NSTEMI 9/10:  LHC with S-RCA ok, S-D1 occluded (chronic), S-D3 occluded, L-LAD ok;  PCI:  Endeavour DES to S-D3;  c. 10/2011 Lexiscan MV EF 37%, medium sized fixed defect over basilar to mid lateral wall.  No ishcemia.   . Lung disease, restrictive   . History of PFTs 01/20/2009  . Pleural effusion   . Chronic renal insufficiency   . CHF (congestive heart failure)     a. Previous EF 40-50%;  b. Echo 08/2011: mild LVH, EF 55-65, grade 3 diast dysfxn, mild AS (mean 19), mild AI, mild BAE  . Hypertension   . Dyslipidemia   . Aortic stenosis     a. echo 4/13: mild AS with mean 19 mmHg  . Atrial fibrillation     a. Chronic coumadin  . Anemia   . Difficult  intubation   . Shortness of breath   . Arthritis     Past Surgical History  Procedure Laterality Date  . Coronary artery bypass graft  1999  . Coronary angioplasty with stent placement      OF THE SAPHENOUS VEIN GRAFT TO THE THIRD DIAGONAL.  HE HAS MODERATE TO SEVERE STENOSIS REMAINING ON HIS NATIVE LEFT CIRCUMFLEX PROXIMAL VESSEL  . Back surgery    . Hernia repair    . Dccv    . Cataract extraction      bilateral    History  Smoking status  . Never Smoker   Smokeless tobacco  . Never Used    History  Alcohol Use No    Family History  Problem Relation Age of Onset  . Heart failure Mother   . Heart attack Father   . Hypertension Father     Reviw of Systems:  Reviewed in the HPI.  All other systems are negative.  Physical Exam: Blood pressure 125/78, pulse 62, height 5\' 8"  (1.727 m), weight 242 lb 1.9 oz (109.825 kg). General: Well developed, well nourished, in no acute distress.  Head: Normocephalic, atraumatic, sclera non-icteric, mucus membranes are moist,   Neck: Supple. Carotids are 2 + without bruits. No JVD  Lungs: Clear bilaterally to auscultation.  Heart: regular rate.  normal  S1 S2. Soft systolic murmur  Abdomen: Soft, non-tender, non-distended with normal bowel sounds. No hepatomegaly. No rebound/guarding. No masses.  Msk:  Strength and tone are normal  Extremities: No clubbing or cyanosis. No significant  leg edema.  Unchanged from previous exams  Neuro: Alert and oriented X 3. Moves all extremities spontaneously.  Psych:  Responds to questions appropriately with a normal affect.  ECG: Oct 07, 2012:  NSR at 32.  Poor R wave progression.  Assessment / Plan:

## 2012-10-07 NOTE — Patient Instructions (Addendum)
Your physician wants you to follow-up in: 6 MONTHS You will receive a reminder letter in the mail two months in advance. If you don't receive a letter, please call our office to schedule the follow-up appointment.  Your physician recommends that you return for a FASTING lipid profile: 6 MONTHS    

## 2012-10-07 NOTE — Assessment & Plan Note (Signed)
He remains in normal sinus rhythm. 

## 2012-10-07 NOTE — Assessment & Plan Note (Signed)
We'll check fasting lipids, hepatic profile, and basic metabolic profile when I see him again in 6 months.

## 2012-10-07 NOTE — Assessment & Plan Note (Signed)
Stable, he's not had any episodes of angina.

## 2012-10-07 NOTE — Assessment & Plan Note (Signed)
Alex Orozco is doing okay. He is trying to watch his salt is still eats will do salt. His breathing is normal. I've encouraged him to try to exercise and to lose weight. His blood pressure is fairly well controlled to

## 2012-11-04 ENCOUNTER — Ambulatory Visit (INDEPENDENT_AMBULATORY_CARE_PROVIDER_SITE_OTHER): Payer: Medicare Other | Admitting: *Deleted

## 2012-11-04 DIAGNOSIS — I4891 Unspecified atrial fibrillation: Secondary | ICD-10-CM

## 2012-11-04 LAB — POCT INR: INR: 2.4

## 2012-11-12 ENCOUNTER — Other Ambulatory Visit: Payer: Self-pay | Admitting: *Deleted

## 2012-11-12 MED ORDER — METOPROLOL TARTRATE 50 MG PO TABS: 50.0000 mg | ORAL_TABLET | Freq: Two times a day (BID) | ORAL | Status: DC

## 2012-11-12 NOTE — Telephone Encounter (Signed)
Fax Received. Refill Completed. Alex Orozco (R.M.A)   

## 2012-11-20 ENCOUNTER — Encounter: Payer: Self-pay | Admitting: Cardiovascular Disease

## 2012-12-09 ENCOUNTER — Ambulatory Visit (INDEPENDENT_AMBULATORY_CARE_PROVIDER_SITE_OTHER): Payer: Medicare Other | Admitting: Pharmacist

## 2012-12-09 DIAGNOSIS — I4891 Unspecified atrial fibrillation: Secondary | ICD-10-CM

## 2012-12-09 LAB — POCT INR: INR: 3

## 2012-12-13 ENCOUNTER — Encounter (HOSPITAL_COMMUNITY): Payer: Self-pay

## 2012-12-13 ENCOUNTER — Encounter (HOSPITAL_COMMUNITY): Payer: Self-pay | Admitting: Emergency Medicine

## 2012-12-13 ENCOUNTER — Observation Stay (HOSPITAL_COMMUNITY): Payer: Medicare Other | Admitting: Anesthesiology

## 2012-12-13 ENCOUNTER — Emergency Department (INDEPENDENT_AMBULATORY_CARE_PROVIDER_SITE_OTHER)
Admission: EM | Admit: 2012-12-13 | Discharge: 2012-12-13 | Disposition: A | Discharge status: Other Acute Inpatient Hospital | Payer: Medicare Other | Source: Home / Self Care | Attending: Family Medicine | Admitting: Family Medicine

## 2012-12-13 ENCOUNTER — Inpatient Hospital Stay (HOSPITAL_COMMUNITY)
Admission: EM | Admit: 2012-12-13 | Discharge: 2012-12-20 | DRG: 981 | Disposition: A | Discharge status: Skilled Nursing Facility | Payer: Medicare Other | Attending: Pulmonary Disease | Admitting: Pulmonary Disease

## 2012-12-13 ENCOUNTER — Encounter (HOSPITAL_COMMUNITY): Payer: Self-pay | Admitting: Anesthesiology

## 2012-12-13 ENCOUNTER — Telehealth: Payer: Self-pay | Admitting: Cardiology

## 2012-12-13 ENCOUNTER — Observation Stay (HOSPITAL_COMMUNITY): Payer: Medicare Other

## 2012-12-13 ENCOUNTER — Encounter (HOSPITAL_COMMUNITY)
Admission: EM | Disposition: A | Discharge status: Skilled Nursing Facility | Payer: Self-pay | Source: Home / Self Care | Attending: Interventional Radiology

## 2012-12-13 DIAGNOSIS — E870 Hyperosmolality and hypernatremia: Secondary | ICD-10-CM | POA: Diagnosis not present

## 2012-12-13 DIAGNOSIS — I2510 Atherosclerotic heart disease of native coronary artery without angina pectoris: Secondary | ICD-10-CM | POA: Diagnosis present

## 2012-12-13 DIAGNOSIS — R040 Epistaxis: Principal | ICD-10-CM | POA: Diagnosis present

## 2012-12-13 DIAGNOSIS — H9190 Unspecified hearing loss, unspecified ear: Secondary | ICD-10-CM | POA: Diagnosis present

## 2012-12-13 DIAGNOSIS — E876 Hypokalemia: Secondary | ICD-10-CM | POA: Diagnosis not present

## 2012-12-13 DIAGNOSIS — J811 Chronic pulmonary edema: Secondary | ICD-10-CM | POA: Diagnosis present

## 2012-12-13 DIAGNOSIS — J9600 Acute respiratory failure, unspecified whether with hypoxia or hypercapnia: Secondary | ICD-10-CM | POA: Diagnosis not present

## 2012-12-13 DIAGNOSIS — G4733 Obstructive sleep apnea (adult) (pediatric): Secondary | ICD-10-CM

## 2012-12-13 DIAGNOSIS — N189 Chronic kidney disease, unspecified: Secondary | ICD-10-CM | POA: Diagnosis present

## 2012-12-13 DIAGNOSIS — I252 Old myocardial infarction: Secondary | ICD-10-CM

## 2012-12-13 DIAGNOSIS — I509 Heart failure, unspecified: Secondary | ICD-10-CM | POA: Diagnosis present

## 2012-12-13 DIAGNOSIS — Z7901 Long term (current) use of anticoagulants: Secondary | ICD-10-CM

## 2012-12-13 DIAGNOSIS — I10 Essential (primary) hypertension: Secondary | ICD-10-CM

## 2012-12-13 DIAGNOSIS — I129 Hypertensive chronic kidney disease with stage 1 through stage 4 chronic kidney disease, or unspecified chronic kidney disease: Secondary | ICD-10-CM | POA: Diagnosis present

## 2012-12-13 DIAGNOSIS — J984 Other disorders of lung: Secondary | ICD-10-CM

## 2012-12-13 DIAGNOSIS — R791 Abnormal coagulation profile: Secondary | ICD-10-CM | POA: Diagnosis present

## 2012-12-13 DIAGNOSIS — E785 Hyperlipidemia, unspecified: Secondary | ICD-10-CM | POA: Diagnosis present

## 2012-12-13 DIAGNOSIS — Z8679 Personal history of other diseases of the circulatory system: Secondary | ICD-10-CM

## 2012-12-13 DIAGNOSIS — I359 Nonrheumatic aortic valve disorder, unspecified: Secondary | ICD-10-CM | POA: Diagnosis present

## 2012-12-13 DIAGNOSIS — D62 Acute posthemorrhagic anemia: Secondary | ICD-10-CM | POA: Diagnosis not present

## 2012-12-13 DIAGNOSIS — R0602 Shortness of breath: Secondary | ICD-10-CM

## 2012-12-13 DIAGNOSIS — Z79899 Other long term (current) drug therapy: Secondary | ICD-10-CM

## 2012-12-13 DIAGNOSIS — R5381 Other malaise: Secondary | ICD-10-CM | POA: Diagnosis present

## 2012-12-13 DIAGNOSIS — N259 Disorder resulting from impaired renal tubular function, unspecified: Secondary | ICD-10-CM

## 2012-12-13 DIAGNOSIS — J9601 Acute respiratory failure with hypoxia: Secondary | ICD-10-CM

## 2012-12-13 DIAGNOSIS — I5032 Chronic diastolic (congestive) heart failure: Secondary | ICD-10-CM | POA: Diagnosis present

## 2012-12-13 DIAGNOSIS — Z7982 Long term (current) use of aspirin: Secondary | ICD-10-CM

## 2012-12-13 DIAGNOSIS — Z7902 Long term (current) use of antithrombotics/antiplatelets: Secondary | ICD-10-CM

## 2012-12-13 DIAGNOSIS — G4730 Sleep apnea, unspecified: Secondary | ICD-10-CM | POA: Diagnosis present

## 2012-12-13 DIAGNOSIS — I4891 Unspecified atrial fibrillation: Secondary | ICD-10-CM | POA: Diagnosis present

## 2012-12-13 DIAGNOSIS — E119 Type 2 diabetes mellitus without complications: Secondary | ICD-10-CM | POA: Diagnosis present

## 2012-12-13 DIAGNOSIS — Z9861 Coronary angioplasty status: Secondary | ICD-10-CM

## 2012-12-13 DIAGNOSIS — Z951 Presence of aortocoronary bypass graft: Secondary | ICD-10-CM

## 2012-12-13 DIAGNOSIS — T45515A Adverse effect of anticoagulants, initial encounter: Secondary | ICD-10-CM | POA: Diagnosis present

## 2012-12-13 DIAGNOSIS — I5023 Acute on chronic systolic (congestive) heart failure: Secondary | ICD-10-CM | POA: Diagnosis present

## 2012-12-13 DIAGNOSIS — I350 Nonrheumatic aortic (valve) stenosis: Secondary | ICD-10-CM | POA: Diagnosis present

## 2012-12-13 HISTORY — PX: RADIOLOGY WITH ANESTHESIA: SHX6223

## 2012-12-13 LAB — CBC WITH DIFFERENTIAL/PLATELET
Basophils Absolute: 0 10*3/uL (ref 0.0–0.1)
Basophils Relative: 0 % (ref 0–1)
MCHC: 31.6 g/dL (ref 30.0–36.0)
Neutro Abs: 6.2 10*3/uL (ref 1.7–7.7)
Neutrophils Relative %: 80 % — ABNORMAL HIGH (ref 43–77)
Platelets: 130 10*3/uL — ABNORMAL LOW (ref 150–400)
RDW: 14.4 % (ref 11.5–15.5)

## 2012-12-13 LAB — PROTIME-INR
INR: 2.43 — ABNORMAL HIGH (ref 0.00–1.49)
Prothrombin Time: 25.6 seconds — ABNORMAL HIGH (ref 11.6–15.2)

## 2012-12-13 LAB — BASIC METABOLIC PANEL
BUN: 42 mg/dL — ABNORMAL HIGH (ref 6–23)
Chloride: 102 mEq/L (ref 96–112)
GFR calc Af Amer: 28 mL/min — ABNORMAL LOW (ref >90)
GFR calc non Af Amer: 25 mL/min — ABNORMAL LOW (ref >90)
Potassium: 3.8 mEq/L (ref 3.5–5.1)
Sodium: 143 mEq/L (ref 135–145)

## 2012-12-13 LAB — ABO/RH: ABO/RH(D): O NEG

## 2012-12-13 LAB — GLUCOSE, CAPILLARY

## 2012-12-13 SURGERY — RADIOLOGY WITH ANESTHESIA
Anesthesia: Monitor Anesthesia Care

## 2012-12-13 MED ORDER — AMLODIPINE BESYLATE 5 MG PO TABS
5.0000 mg | ORAL_TABLET | Freq: Every day | ORAL | Status: DC
  Filled 2012-12-13: qty 1

## 2012-12-13 MED ORDER — FAMOTIDINE 10 MG PO TABS: 10.0000 mg | ORAL_TABLET | Freq: Every morning | ORAL | Status: DC

## 2012-12-13 MED ORDER — SODIUM CHLORIDE 0.9 % IJ SOLN
3.0000 mL | Freq: Two times a day (BID) | INTRAMUSCULAR | Status: DC
  Administered 2012-12-13 – 2012-12-14 (×2): 3 mL via INTRAVENOUS

## 2012-12-13 MED ORDER — ASPIRIN 81 MG PO TABS: 81.0000 mg | ORAL_TABLET | Freq: Every day | ORAL | Status: DC

## 2012-12-13 MED ORDER — ATORVASTATIN CALCIUM 20 MG PO TABS
20.0000 mg | ORAL_TABLET | Freq: Every day | ORAL | Status: DC
  Administered 2012-12-13: 20 mg via ORAL
  Filled 2012-12-13 (×2): qty 1

## 2012-12-13 MED ORDER — HYDROXYCHLOROQUINE SULFATE 200 MG PO TABS
200.0000 mg | ORAL_TABLET | Freq: Two times a day (BID) | ORAL | Status: DC
  Administered 2012-12-13: 200 mg via ORAL
  Filled 2012-12-13 (×3): qty 1

## 2012-12-13 MED ORDER — CEFAZOLIN SODIUM 1-5 GM-% IV SOLN: 1.0000 g | Freq: Once | INTRAVENOUS | Status: DC

## 2012-12-13 MED ORDER — SODIUM CHLORIDE 0.9 % IV SOLN
250.0000 mL | INTRAVENOUS | Status: DC | PRN
  Administered 2012-12-13: 250 mL via INTRAVENOUS

## 2012-12-13 MED ORDER — METOPROLOL TARTRATE 50 MG PO TABS
50.0000 mg | ORAL_TABLET | Freq: Two times a day (BID) | ORAL | Status: DC
  Administered 2012-12-13: 50 mg via ORAL
  Filled 2012-12-13 (×3): qty 1

## 2012-12-13 MED ORDER — ONDANSETRON HCL 4 MG/2ML IJ SOLN: 4.0000 mg | Freq: Four times a day (QID) | INTRAMUSCULAR | Status: DC | PRN

## 2012-12-13 MED ORDER — FUROSEMIDE 40 MG PO TABS: 40.0000 mg | ORAL_TABLET | Freq: Two times a day (BID) | ORAL | Status: DC

## 2012-12-13 MED ORDER — VITAMIN K1 10 MG/ML IJ SOLN
5.0000 mg | Freq: Once | INTRAVENOUS | Status: AC
  Administered 2012-12-13: 5 mg via INTRAVENOUS
  Filled 2012-12-13: qty 0.5

## 2012-12-13 MED ORDER — ONDANSETRON HCL 4 MG PO TABS: 4.0000 mg | ORAL_TABLET | Freq: Four times a day (QID) | ORAL | Status: DC | PRN

## 2012-12-13 MED ORDER — FUROSEMIDE 40 MG PO TABS
40.0000 mg | ORAL_TABLET | Freq: Every day | ORAL | Status: DC
  Filled 2012-12-13: qty 1

## 2012-12-13 MED ORDER — METOPROLOL TARTRATE 25 MG PO TABS
50.0000 mg | ORAL_TABLET | Freq: Two times a day (BID) | ORAL | Status: DC
Start: 2012-12-13 — End: 2012-12-13

## 2012-12-13 MED ORDER — SODIUM CHLORIDE 0.9 % IV SOLN: INTRAVENOUS | Status: DC

## 2012-12-13 MED ORDER — ACETAMINOPHEN 650 MG RE SUPP: 650.0000 mg | Freq: Four times a day (QID) | RECTAL | Status: DC | PRN

## 2012-12-13 MED ORDER — AMLODIPINE BESYLATE 2.5 MG PO TABS: 2.5000 mg | ORAL_TABLET | Freq: Every day | ORAL | Status: DC

## 2012-12-13 MED ORDER — SODIUM CHLORIDE 0.9 % IJ SOLN
3.0000 mL | INTRAMUSCULAR | Status: DC | PRN
  Administered 2012-12-13: 3 mL via INTRAVENOUS

## 2012-12-13 MED ORDER — INSULIN ASPART 100 UNIT/ML ~~LOC~~ SOLN: 0.0000 [IU] | Freq: Three times a day (TID) | SUBCUTANEOUS | Status: DC

## 2012-12-13 MED ORDER — CEFAZOLIN SODIUM-DEXTROSE 2-3 GM-% IV SOLR: 2.0000 g | Freq: Once | INTRAVENOUS | Status: DC

## 2012-12-13 MED ORDER — COCAINE HCL 4 % EX SOLN
4.0000 mL | Freq: Once | CUTANEOUS | Status: AC
  Administered 2012-12-13: 4 mL via NASAL
  Filled 2012-12-13: qty 4

## 2012-12-13 MED ORDER — OXYCODONE HCL 5 MG PO TABS: 5.0000 mg | ORAL_TABLET | ORAL | Status: DC | PRN

## 2012-12-13 MED ORDER — INSULIN ASPART 100 UNIT/ML ~~LOC~~ SOLN: 0.0000 [IU] | Freq: Every day | SUBCUTANEOUS | Status: DC

## 2012-12-13 MED ORDER — ASPIRIN 81 MG PO CHEW: 81.0000 mg | CHEWABLE_TABLET | Freq: Every day | ORAL | Status: DC

## 2012-12-13 MED ORDER — AMLODIPINE BESYLATE 5 MG PO TABS
5.0000 mg | ORAL_TABLET | ORAL | Status: AC
  Administered 2012-12-13: 5 mg via ORAL
  Filled 2012-12-13 (×2): qty 1

## 2012-12-13 MED ORDER — FUROSEMIDE 40 MG PO TABS
60.0000 mg | ORAL_TABLET | Freq: Every day | ORAL | Status: DC
  Filled 2012-12-13 (×2): qty 1

## 2012-12-13 MED ORDER — ACETAMINOPHEN 325 MG PO TABS: 650.0000 mg | ORAL_TABLET | Freq: Four times a day (QID) | ORAL | Status: DC | PRN

## 2012-12-13 MED ORDER — DOXAZOSIN MESYLATE 4 MG PO TABS
4.0000 mg | ORAL_TABLET | Freq: Every day | ORAL | Status: DC
  Administered 2012-12-13: 4 mg via ORAL
  Filled 2012-12-13: qty 1

## 2012-12-13 MED ORDER — HYDRALAZINE HCL 20 MG/ML IJ SOLN
10.0000 mg | INTRAMUSCULAR | Status: DC | PRN
  Administered 2012-12-14 (×2): 20 mg via INTRAVENOUS
  Filled 2012-12-13 (×2): qty 1

## 2012-12-13 MED ORDER — CLOPIDOGREL BISULFATE 75 MG PO TABS: 75.0000 mg | ORAL_TABLET | Freq: Every day | ORAL | Status: DC

## 2012-12-13 NOTE — ED Notes (Signed)
Bleeding from rt nre since this am is on coumadin for cardiac problems is suppose to see [place]

## 2012-12-13 NOTE — Anesthesia Preprocedure Evaluation (Addendum)
Anesthesia Evaluation  Patient identified by MRN, date of birth, ID band Patient awake    Reviewed: Allergy & Precautions, H&P , NPO status , Patient's Chart, lab work & pertinent test results  History of Anesthesia Complications (+) DIFFICULT AIRWAY  Airway       Dental   Pulmonary sleep apnea ,          Cardiovascular hypertension, + CAD, + Cardiac Stents, + CABG and +CHF + dysrhythmias Atrial Fibrillation + Valvular Problems/Murmurs AS     Neuro/Psych    GI/Hepatic   Endo/Other  diabetes, Type 2  Renal/GU Renal InsufficiencyRenal disease     Musculoskeletal   Abdominal   Peds  Hematology   Anesthesia Other Findings Nose Bleed  Reproductive/Obstetrics                          Anesthesia Physical Anesthesia Plan  ASA: III  Anesthesia Plan: MAC and General   Post-op Pain Management:    Induction: Intravenous  Airway Management Planned: Mask, LMA and Oral ETT  Additional Equipment:   Intra-op Plan:   Post-operative Plan: Extubation in OR  Informed Consent:   Plan Discussed with: CRNA, Anesthesiologist and Surgeon  Anesthesia Plan Comments:         Anesthesia Quick Evaluation

## 2012-12-13 NOTE — ED Provider Notes (Signed)
CSN: [id number]     Arrival date & time 12/13/12  1057 History     First MD Initiated Contact with Patient 12/13/12 1127     Chief Complaint  Patient presents with  . Epistaxis   (Consider location/radiation/quality/duration/timing/severity/associated sxs/prior Treatment) HPI Comments: And 79 year old male with history of coronary artery disease and atrial fib on chronic anticoagulation with Coumadin. Last INR check last week at 3.0 as per family report, no recent dose changes. Comments complaining of nosebleeds from his right nostril since waking up this morning at 6 AM. Has been nonstop and making patient to clear his throat and spit red blood frequently. But reports breathing comfortably. Denies blood in his urine or through the rectal mucosa. Denies chest pain or shortness of breath. No bruising. Denies dizziness or headache. No recent colds or nasal congestion prior the beginning of nosebleeds. Denies known direct trauma to his nose.   Past Medical History  Diagnosis Date  . Coronary artery disease     a.  s/p CABG;  b. NSTEMI 9/10:  LHC with S-RCA ok, S-D1 occluded (chronic), S-D3 occluded, L-LAD ok;  PCI:  Endeavour DES to S-D3;  c. 10/2011 Lexiscan MV EF 37%, medium sized fixed defect over basilar to mid lateral wall.  No ishcemia.   . Lung disease, restrictive   . History of PFTs 01/20/2009  . Pleural effusion   . Chronic renal insufficiency   . CHF (congestive heart failure)     a. Previous EF 40-50%;  b. Echo 08/2011: mild LVH, EF 55-65, grade 3 diast dysfxn, mild AS (mean 19), mild AI, mild BAE  . Hypertension   . Dyslipidemia   . Aortic stenosis     a. echo 4/13: mild AS with mean 19 mmHg  . Atrial fibrillation     a. Chronic coumadin  . Anemia   . Difficult intubation   . Shortness of breath   . Arthritis   . S/P CABG x 5 1990   Past Surgical History  Procedure Laterality Date  . Coronary artery bypass graft  1999  . Coronary angioplasty with stent placement  2010    DES to the SVG-D3 (100%->0%). LAD 100%, CFX 90%, RCA 100%; LIMA-LAD OK, SVG-D1 100%, SVG-RCA OK  . Back surgery    . Hernia repair    . Dccv    . Cataract extraction      bilateral   Family History  Problem Relation Age of Onset  . Heart failure Mother   . Heart attack Father   . Hypertension Father    History  Substance Use Topics  . Smoking status: Never Smoker   . Smokeless tobacco: Never Used  . Alcohol Use: No    Review of Systems  Constitutional: Negative for fever and chills.  HENT: Positive for nosebleeds. Negative for congestion, sore throat, trouble swallowing and sinus pressure.   Respiratory: Negative for cough and shortness of breath.   Gastrointestinal: Negative for nausea, vomiting and abdominal pain.  Skin: Negative for rash.       No bruising    Neurological: Negative for dizziness and headaches.  All other systems reviewed and are negative.    Allergies  Review of patient's allergies indicates no known allergies.  Home Medications   No current outpatient prescriptions on file. BP 129/87  Pulse 73  Temp(Src) 97.8 F (36.6 C) (Oral)  Resp 14  SpO2 98% Physical Exam  Nursing note and vitals reviewed. Constitutional: He is oriented to person, place, and  time. He appears well-developed and well-nourished. No distress.  HENT:  Head: Normocephalic and atraumatic.  Right Ear: External ear normal.  Left Ear: External ear normal.  Mouth/Throat: No oropharyngeal exudate.  Nose: There is right nostril epistaxis. Small abrasion observed in right septum but no obviously bleeding during observation. Turbinate appears red and with clots on top.  I can see red blood strik and a long clot coming down the posterior pharynx.  Cardiovascular: Normal rate and regular rhythm.   Murmur heard. Pulmonary/Chest: Breath sounds normal. He has no rales.  Abdominal: Soft. There is no tenderness.  Neurological: He is alert and oriented to person, place, and time.  Skin:  No rash noted. He is not diaphoretic.    ED Course   Procedures (including critical care time)   1. Epistaxis     MDM  Treated initially with with nasal phenylephrine and applied xeroform gauze packing subsequently used a rapid rhino nasal pac that was not tolerated. Continued with anterior and possible posterior mild to moderate persistent bleeding. Decided to loosely placed a Xeroform gauze and referred to be seen by ENT specialist across the street. (Dr. Annalee Genta will see patient right after discharge from here). PT/INR and CBC pending at time of discharge.     Sharin Grave, MD 12/15/12 [phone]

## 2012-12-13 NOTE — Telephone Encounter (Signed)
I received a call from Dr. Osborn Coho, ENT physician about Mr. Alex Orozco who he was seen in the office today for a severe nosebleed.  The patient is on triple anticoagulant therapy with long-term Coumadin as well as aspirin and Plavix.  We agreed that over the weekend we will have the patient hold both his aspirin and his Plavix.  Continue Coumadin.  Restart the aspirin on Monday, July 28.  Touch base with Dr. Elease Hashimoto next week about when and if to restart Plavix.

## 2012-12-13 NOTE — ED Notes (Addendum)
C/o epistaxis right nare since 0630. Sent here from [facility] office to meet with Dr. Corliss Skains for possible surgery. Continues to bleed, slowly. Denies trauma. Pt is on plavix & coumadin

## 2012-12-13 NOTE — Consult Note (Signed)
CARDIOLOGY CONSULT NOTE   Patient ID: Alex Orozco MRN: [id number] DOB/AGE: 06/25/1929 79 y.o.  Admit date: 12/13/2012  Primary Physician   Kaleen Mask, MD Primary Cardiologist   Nahser Reason for Consultation   Management of anticoagulation  HPI:Riaz L Bogue is a 79 y.o. male with a history of CABG and subsequent stenting.  He is on Coumadin for atrial fibrillation and is on aspirin and Plavix because of a drug-eluting stent placed in 2010.   He was in his usual state of health today and had sudden onset of epistaxis on the right. It started this morning. His blood pressure runs on the high side, frequently in the 140s but has not been higher than usual. He has not had any sinus trouble or other upper respiratory problems that might have caused this. When it did not stop, he went to urgent care who sent him to an ear nose and throat physician and subsequently to the emergency room. In the emergency room, his INR is therapeutic. This is the first time he's had any bleeding problems since being started on Coumadin. He has not had any recent chest pain. He has chronic lower extremity edema that has not changed recently. He doesn't think his weight is any different than usual. His shortness of breath is at baseline. He denies any PND or orthopnea. He denies palpitations and is not aware of his irregular heartbeat. When he was last seen by Dr. Elease Hashimoto, he was in sinus rhythm but is currently in atrial fib, rate is controlled.  Past Medical History  Diagnosis Date  . Coronary artery disease     a.  s/p CABG;  b. NSTEMI 9/10:  LHC with S-RCA ok, S-D1 occluded (chronic), S-D3 occluded, L-LAD ok;  PCI:  Endeavour DES to S-D3;  c. 10/2011 Lexiscan MV EF 37%, medium sized fixed defect over basilar to mid lateral wall.  No ishcemia.   . Lung disease, restrictive   . History of PFTs 01/20/2009  . Pleural effusion   . Chronic renal insufficiency   . CHF (congestive heart failure)     a. Previous  EF 40-50%;  b. Echo 08/2011: mild LVH, EF 55-65, grade 3 diast dysfxn, mild AS (mean 19), mild AI, mild BAE  . Hypertension   . Dyslipidemia   . Aortic stenosis     a. echo 4/13: mild AS with mean 19 mmHg  . Atrial fibrillation     a. Chronic coumadin  . Anemia   . Difficult intubation   . Shortness of breath   . Arthritis     Past Surgical History  Procedure Laterality Date  . Coronary artery bypass graft  1999  . Coronary angioplasty with stent placement  2010    DES to the SVG-D3 (100%->0%). LAD 100%, CFX 90%, RCA 100%; LIMA-LAD OK, SVG-D1 100%, SVG-RCA OK  . Back surgery    . Hernia repair    . Dccv    . Cataract extraction      bilateral    No Known Allergies  I have reviewed the patient's current medications . amLODipine  5 mg Oral To ER  . [START ON 12/14/2012] amLODipine  5 mg Oral Daily  . aspirin  81 mg Oral Daily  . metoprolol  50 mg Oral BID   Prior to Admission medications   Medication Sig Start Date End Date Taking? Authorizing Provider  amLODipine (NORVASC) 2.5 MG tablet Take 1 tablet (2.5 mg total) by mouth daily. 04/24/12 04/24/13 Yes  Vesta Mixer, MD  aspirin 81 MG tablet Take 81 mg by mouth daily.     Yes Historical Provider, MD  Cholecalciferol (VITAMIN D-3) 5000 UNITS TABS Take 1 tablet by mouth daily.    Yes Historical Provider, MD  clopidogrel (PLAVIX) 75 MG tablet Take 1 tablet (75 mg total) by mouth daily. 02/21/12  Yes Vesta Mixer, MD  doxazosin (CARDURA) 4 MG tablet Take 1 tablet (4 mg total) by mouth at bedtime. 08/02/12  Yes Vesta Mixer, MD  famotidine (PEPCID) 10 MG tablet Take 10 mg by mouth every morning. For acid   Yes Historical Provider, MD  febuxostat (ULORIC) 40 MG tablet Take 40 mg by mouth daily as needed. For gout   Yes Historical Provider, MD  Ferrous Gluconate (IRON) 240 (27 FE) MG TABS Take 1 tablet by mouth daily.   Yes Historical Provider, MD  fish oil-omega-3 fatty acids 1000 MG capsule Take 1 g by mouth daily.    Yes  Historical Provider, MD  furosemide (LASIX) 40 MG tablet Take 40-60 mg by mouth 2 (two) times daily. STARTING 10/10/11 taking 60 MG in morning and 40 mg in evening 01/08/12  Yes Vesta Mixer, MD  hydroxychloroquine (PLAQUENIL) 200 MG tablet Take 200 mg by mouth 2 (two) times daily.    Yes Historical Provider, MD  metoprolol (LOPRESSOR) 50 MG tablet Take 1 tablet (50 mg total) by mouth 2 (two) times daily. 11/12/12  Yes Vesta Mixer, MD  rosuvastatin (CRESTOR) 10 MG tablet Take 10 mg by mouth every evening. 03/19/12  Yes Vesta Mixer, MD  warfarin (COUMADIN) 5 MG tablet Take 2.5-5 mg by mouth every evening. Takes 5mg  on Mon & Fri & 2.5mg  on all other days 10/02/12  Yes Vesta Mixer, MD  colchicine 0.6 MG tablet Take 0.6 mg by mouth daily as needed. For gout    Historical Provider, MD   History   Social History  . Marital Status: Married    Spouse Name: N/A    Number of Children: N/A  . Years of Education: N/A   Occupational History  . Retired    Social History Main Topics  . Smoking status: Never Smoker   . Smokeless tobacco: Never Used  . Alcohol Use: No  . Drug Use: No  . Sexually Active: Not Currently   Other Topics Concern  . Not on file   Social History Narrative  . No narrative on file    Family Status  Relation Status Death Age  . Mother Deceased 91  . Father Deceased 58   Family History  Problem Relation Age of Onset  . Heart failure Mother   . Heart attack Father   . Hypertension Father      ROS: He is anemic but his H&H is the same as it was a year ago. Full 14 point review of systems complete and found to be negative unless listed above.  Physical Exam: Blood pressure 170/77, pulse 87, resp. rate 22, SpO2 99.00%.  General: Well developed, well nourished, elderly male in no acute distress Head: Eyes PERRLA, No xanthomas.   Normocephalic and atraumatic, oropharynx without edema or exudate. Dentition: poor; has some oozing blood going down the back of  his throat, coughs it up. Lungs: few rales bases Heart: Heart irregular rate and rhythm with S1, S2; 2-3/6 murmur. pulses are 2+ extrem.   Neck: No carotid bruits. No lymphadenopathy.  JVD at 8 cm. Abdomen: Bowel sounds present, abdomen soft  and non-tender without masses or hernias noted. Msk:  No spine or cva tenderness. No weakness, no joint deformities or effusions. Extremities: No clubbing or cyanosis. 2 + bilateral LE edema.  Neuro: Alert and oriented X 3. No focal deficits noted. Psych:  Good affect, responds appropriately Skin: No rashes or lesions noted.  Labs:   Lab Results  Component Value Date   WBC 7.8 12/13/2012   HGB 10.9* 12/13/2012   HCT 34.5* 12/13/2012   MCV 88.7 12/13/2012   PLT 130* 12/13/2012    Recent Labs  12/13/12 1240  INR 2.43*     Recent Labs Lab 12/13/12 1551  NA 143  K 3.8  CL 102  CO2 29  BUN 42*  CREATININE 2.35*  CALCIUM 9.4  GLUCOSE 112*   Echo: 08/25/2011 Conclusions Left ventricle: The cavity size was normal. Wall thickness was increased in a pattern of mild LVH. Systolic function was normal. The estimated ejection fraction was in the range of 55% to 65%. Wall motion was normal; there were no regional wall motion abnormalities. Doppler parameters are consistent with a reversible restrictive pattern, indicative of decreased left ventricular diastolic compliance and/or increased left atrial pressure (grade 3 diastolic dysfunction). Doppler parameters are consistent with high ventricular filling pressure. - Aortic valve: Mild calcification of the leaflets. The left and right cusps are fused. There is mild AS and mild AI. Mean gradient: 19mm Hg (S). Peak gradient: 31mm Hg (S). Valve area: 1.29cm^2(VTI). Valve area: 1.16cm^2 (Vmax). - Left atrium: The atrium was mildly dilated. - Right atrium: The atrium was mildly dilated.  ECG:   Atrial fib, rate controlled, no new ischemic changes  Radiology:   Steffanie Dunn  10/23/2011 Overall Impression: Abnormal stress nuclear study. There is a medium-sized, fixed area of moderately reduced tracer uptake over the basilar to mid lateral wall suggestive of previous infarct. However patient was scanned with arms down so there may also be some soft tissue attenuation. No ischemia. EF 37%  ASSESSMENT AND PLAN:   The patient was seen today by Dr Purvis Sheffield, the patient evaluated and the data reviewed.  Principal Problem:   Bleeding nose Active Problems:   DM   Accelerated HYPERTENSION   AORTIC STENOSIS   Chronic diastolic heart failure   Atrial fibrillation   Warfarin anticoagulation   CAD s/p CABG   Grade III diastolic dysfunction             Attending Addendum:      I had a long discussion with Mr. Cremer and his daughter. There are several issues. He's had uncontrolled epistaxis and the plan is for interventional radiology to apparently embolize the bleed under general anesthesia. He is on anticoagulation with Warfarin for paroxysmal atrial fibrillation. He is also on ASA and Plavix as he has a h/o CAD s/p 4-v CABG and most recently, had 3 patent grafts in 2010 (SVG to 3rd diagonal was stented at that time). In April 2013, he had normal LV systolic function and grade III diastolic dysfunction with an elevated LVEDP. A stress test in June 2013 showed no evidence of ischemia but myocardial scar was present, indicative of a prior infarct, but the study was complicated by soft tissue attenuation. He has mild aortic stenosis as well.      His BP was 188/101 mmHg when I was in the room, and the patient told me he took none of his medications today. I recommend his BP be aggressively managed, as he has a h/o diastolic heart failure  in the past, and he can certainly decompensate from this standpoint. He may have reverted back into atrial fibrillation for this very reason (elevated BP leading to an elevation of his LVEDP).  We will given him Amlodipine 5 mg now, and I  recommend he be on this daily. I also recommend he be restarted on his beta blocker (Metoprolol) as this will decrease his perioperative risk of a major adverse cardiovascular event. He can be given FFP and Vitamin K to help reverse his Warfarin, as the daily risk of a CVA is very low in patients with atrial fibrillation and not on anticoagulants. However, I recommend this be restarted as soon as is deemed feasible by interventional radiology. His ASA and Plavix can be held for the time being, and ASA can also be restarted as soon as is deemed feasible. With regards to Plavix and whether or not he needs to remain on this can be addressed with his primary cardiologist, Dr. Elease Hashimoto. He should also receive some IV Lasix as his elevated BP and LVEDP superimposed on his diastolic dysfunction has led to lower extremity edema.   Time spent: 60 minutes.      SignedTheodore Demark, PA-C 12/13/2012 7:30 PM Beeper [phone]  Co-Sign MD

## 2012-12-13 NOTE — ED Notes (Signed)
Family at bedside. 

## 2012-12-13 NOTE — ED Provider Notes (Addendum)
CSN: [id number]     Arrival date & time 12/13/12  1454 History     First MD Initiated Contact with Patient 12/13/12 1516     Chief Complaint  Patient presents with  . Epistaxis   (Consider location/radiation/quality/duration/timing/severity/associated sxs/prior Treatment) Patient is a 79 y.o. male presenting with nosebleeds. The history is provided by the patient and the spouse.  Epistaxis Associated symptoms: no fever   pt with hx afib, cad s/p cabg, htn, on chronic anticoagulation w asa, plavis and coumadin, presents w/ right sided nosebleed onset this morning. Slow, steady ooze of blood. No hx nosebleeds. No recent injury or trauma to nose, no nose-picking behavior. No rhinorrhea, sneezing, or uri c/o. Denies other abn bleeding or bruising. No other recent change in meds. Denies faintness or dizziness. Pt went to urgent care, then ent, and subsequently was told to go to ED.      Past Medical History  Diagnosis Date  . Coronary artery disease     a.  s/p CABG;  b. NSTEMI 9/10:  LHC with S-RCA ok, S-D1 occluded (chronic), S-D3 occluded, L-LAD ok;  PCI:  Endeavour DES to S-D3;  c. 10/2011 Lexiscan MV EF 37%, medium sized fixed defect over basilar to mid lateral wall.  No ishcemia.   . Lung disease, restrictive   . History of PFTs 01/20/2009  . Pleural effusion   . Chronic renal insufficiency   . CHF (congestive heart failure)     a. Previous EF 40-50%;  b. Echo 08/2011: mild LVH, EF 55-65, grade 3 diast dysfxn, mild AS (mean 19), mild AI, mild BAE  . Hypertension   . Dyslipidemia   . Aortic stenosis     a. echo 4/13: mild AS with mean 19 mmHg  . Atrial fibrillation     a. Chronic coumadin  . Anemia   . Difficult intubation   . Shortness of breath   . Arthritis    Past Surgical History  Procedure Laterality Date  . Coronary artery bypass graft  1999  . Coronary angioplasty with stent placement      OF THE SAPHENOUS VEIN GRAFT TO THE THIRD DIAGONAL.  HE HAS MODERATE TO SEVERE  STENOSIS REMAINING ON HIS NATIVE LEFT CIRCUMFLEX PROXIMAL VESSEL  . Back surgery    . Hernia repair    . Dccv    . Cataract extraction      bilateral   Family History  Problem Relation Age of Onset  . Heart failure Mother   . Heart attack Father   . Hypertension Father    History  Substance Use Topics  . Smoking status: Never Smoker   . Smokeless tobacco: Never Used  . Alcohol Use: No    Review of Systems  Constitutional: Negative for fever.  HENT: Positive for nosebleeds. Negative for neck pain.   Eyes: Negative for redness.  Respiratory: Negative for shortness of breath.   Cardiovascular: Negative for chest pain.  Gastrointestinal: Negative for abdominal pain.  Genitourinary: Negative for flank pain.  Musculoskeletal: Negative for back pain.  Skin: Negative for rash.  Neurological: Negative for light-headedness.  Hematological: Does not bruise/bleed easily.  Psychiatric/Behavioral: Negative for confusion.    Allergies  Review of patient's allergies indicates no known allergies.  Home Medications   Current Outpatient Rx  Name  Route  Sig  Dispense  Refill  . amLODipine (NORVASC) 2.5 MG tablet   Oral   Take 1 tablet (2.5 mg total) by mouth daily.   90 tablet  3   . aspirin 81 MG tablet   Oral   Take 81 mg by mouth daily.           . Cholecalciferol (VITAMIN D-3) 5000 UNITS TABS   Oral   Take 1 tablet by mouth daily.          . clopidogrel (PLAVIX) 75 MG tablet   Oral   Take 1 tablet (75 mg total) by mouth daily.   90 tablet   3   . doxazosin (CARDURA) 4 MG tablet   Oral   Take 1 tablet (4 mg total) by mouth at bedtime.   30 tablet   5   . famotidine (PEPCID) 10 MG tablet   Oral   Take 10 mg by mouth every morning. For acid         . febuxostat (ULORIC) 40 MG tablet   Oral   Take 40 mg by mouth daily as needed. For gout         . Ferrous Gluconate (IRON) 240 (27 FE) MG TABS   Oral   Take 1 tablet by mouth daily.         . fish  oil-omega-3 fatty acids 1000 MG capsule   Oral   Take 1 g by mouth daily.          . furosemide (LASIX) 40 MG tablet   Oral   Take 40-60 mg by mouth 2 (two) times daily. STARTING 10/10/11 taking 60 MG in morning and 40 mg in evening         . hydroxychloroquine (PLAQUENIL) 200 MG tablet   Oral   Take 200 mg by mouth 2 (two) times daily.          . metoprolol (LOPRESSOR) 50 MG tablet   Oral   Take 1 tablet (50 mg total) by mouth 2 (two) times daily.   60 tablet   6   . rosuvastatin (CRESTOR) 10 MG tablet   Oral   Take 10 mg by mouth every evening.         . warfarin (COUMADIN) 5 MG tablet   Oral   Take 2.5-5 mg by mouth every evening. Takes 5mg  on Mon & Fri & 2.5mg  on all other days         . colchicine 0.6 MG tablet   Oral   Take 0.6 mg by mouth daily as needed. For gout          BP 163/108  Pulse 99  Resp 22  SpO2 98% Physical Exam  Nursing note and vitals reviewed. Constitutional: He is oriented to person, place, and time. He appears well-developed and well-nourished. No distress.  HENT:  Head: Atraumatic.  Dried blood right nares, w slow ooze small amt blood.  No heavy or brisk bleeding.   Eyes: Conjunctivae are normal.  Neck: Neck supple. No tracheal deviation present.  Cardiovascular: Normal rate, normal heart sounds and intact distal pulses.   Pulmonary/Chest: Effort normal and breath sounds normal. No accessory muscle usage. No respiratory distress.  Abdominal: He exhibits no distension.  Musculoskeletal: Normal range of motion. He exhibits no edema.  Neurological: He is alert and oriented to person, place, and time.  Skin: Skin is warm and dry.  Psychiatric: He has a normal mood and affect.    ED Course   Procedures (including critical care time)  Results for orders placed during the hospital encounter of 12/13/12  CBC WITH DIFFERENTIAL      Result Value Range  WBC 7.8  4.0 - 10.5 K/uL   RBC 3.89 (*) 4.22 - 5.81 MIL/uL   Hemoglobin 10.9  (*) 13.0 - 17.0 g/dL   HCT 40.9 (*) 81.1 - 91.4 %   MCV 88.7  78.0 - 100.0 fL   MCH 28.0  26.0 - 34.0 pg   MCHC 31.6  30.0 - 36.0 g/dL   RDW 78.2  95.6 - 21.3 %   Platelets 130 (*) 150 - 400 K/uL   Neutrophils Relative % 80 (*) 43 - 77 %   Neutro Abs 6.2  1.7 - 7.7 K/uL   Lymphocytes Relative 7 (*) 12 - 46 %   Lymphs Abs 0.6 (*) 0.7 - 4.0 K/uL   Monocytes Relative 7  3 - 12 %   Monocytes Absolute 0.6  0.1 - 1.0 K/uL   Eosinophils Relative 5  0 - 5 %   Eosinophils Absolute 0.4  0.0 - 0.7 K/uL   Basophils Relative 0  0 - 1 %   Basophils Absolute 0.0  0.0 - 0.1 K/uL  PROTIME-INR      Result Value Range   Prothrombin Time 25.6 (*) 11.6 - 15.2 seconds   INR 2.43 (*) 0.00 - 1.49       MDM  Iv ns. Labs from earlier reviewed, w planned IR procedure, bmet added to labs.  Reviewed prior notes  - pt seen at urgent care today and by ENT/Dr Annalee Genta - unable to stop bleeding so they had talked with IR/Dr Corliss Skains re possible IR procedure.  Dr Corliss Skains evaluated in ED and indicates would need admission to med service, reversal of anticoag, and then could proceed.   In interim, silver nitrate to small bleeding area ant septum, and cotton impregnated w neosyn and 4% top cocaine placed right nares, pressure held.  Dr Annalee Genta called-  Indicated unable to stop in office, rec IR.  pcp admit and card called.  [facility] card indicates they will see, consult. Discussed pt, tentative plan w [facility] hospitalist, they indicate temp orders, tele, obs.    Date: 12/13/2012  Rate: 91  Rhythm: atrial fibrillation  QRS Axis: normal  Intervals: normal  ST/T Wave abnormalities: normal  Conduction Disutrbances:none  Narrative Interpretation:   Old EKG Reviewed: unchanged      Suzi Roots, MD 12/13/12 1701

## 2012-12-13 NOTE — ED Notes (Signed)
MD, Dr. Corliss Skains at bedside.

## 2012-12-13 NOTE — Progress Notes (Signed)
IR aware of this pt with persistent right epistaxis. Seen by ENT earlier today with packing performed. Bleeding has significantly slowed but persists. Pt now in ER for further treatment and IR asked to be involved for embolization procedure. PMHx is reviewed and plays significant role in treatment. On Coumadin, Plavix, and ASA. INR today is 2.43. Pt also has CKD, most recent Creatinine I could find was ~2.1 CAD, CHF, Aortic stenosis BP 163/108  Pulse 99  Resp 22  SpO2 98% Rt nare with slow ooze of bright red blood.  Dr. Corliss Skains has seen pt in ER and discussed with pt and family. Discussed angiogram embolization as treatment of this epistaxis. Explained procedure, risks, complications, need for general anesthesia. Will need Medicine and preferably Cardiology eval for clearance for General anesthesia as well as cessation of Coumadin and reversal of INR. Dr. Corliss Skains is ok with Plavix and ASA on board but INR will need to be reversed if embolization is to be effective. Also discussed with pt and family that angiogram increases risks of acute on chronic renal failure. Pt has otherwise been NPO all day and should remain NPO until procedure decision is made.  Please contact IR dept when cleared for procedure or with further questions. [phone]  Brayton El PA-C Interventional Radiology 12/13/2012 3:43 PM

## 2012-12-13 NOTE — ED Notes (Addendum)
States he woke this AM w bleeding from right nostril ,occasional throat clearing, c/o unable to make it stop w direct pressure; denies injury to nose; on coumadin for cardiac isses. NAD , w/d/color good. Advised direct pressure to nares on arrival

## 2012-12-13 NOTE — H&P (Addendum)
[facility] History and Physical  Alex Orozco WUJ:[id number] DOB: Feb 24, 1930 DOA: 12/13/2012  Referring physician: er PCP: Kaleen Mask, MD  Specialists:   Chief Complaint: nose bleed  HPI: Alex Orozco is a 79 y.o. male   with hx afib, cad s/p cabg, htn, on chronic anticoagulation (asa, plavix and coumadin).  He presents w/ right sided nosebleed that started this morning. Slow, steady ooze of blood. No hx nosebleeds. No recent injury or trauma to nose, no nose-picking. No rhinorrhea, sneezing, or uri c/o. Denies other abn bleeding or bruising. No other recent change in meds. Denies faintness or dizziness. Pt went initally to urgent care who sent him to the ENT- he was unable to stop the bleeding so he was sent to the ER for evaluation by IR.    In the ER, he was seen by Dr. Corliss Skains who is ok with Plavix and ASA  but INR will need to be reversed if embolization is to be effective.  Cardiology consulted to help with coumadin reversal.    Review of Systems: all systems reviewed, negative unless stated above    Past Medical History  Diagnosis Date  . Coronary artery disease     a.  s/p CABG;  b. NSTEMI 9/10:  LHC with S-RCA ok, S-D1 occluded (chronic), S-D3 occluded, L-LAD ok;  PCI:  Endeavour DES to S-D3;  c. 10/2011 Lexiscan MV EF 37%, medium sized fixed defect over basilar to mid lateral wall.  No ishcemia.   . Lung disease, restrictive   . History of PFTs 01/20/2009  . Pleural effusion   . Chronic renal insufficiency   . CHF (congestive heart failure)     a. Previous EF 40-50%;  b. Echo 08/2011: mild LVH, EF 55-65, grade 3 diast dysfxn, mild AS (mean 19), mild AI, mild BAE  . Hypertension   . Dyslipidemia   . Aortic stenosis     a. echo 4/13: mild AS with mean 19 mmHg  . Atrial fibrillation     a. Chronic coumadin  . Anemia   . Difficult intubation   . Shortness of breath   . Arthritis    Past Surgical History  Procedure Laterality Date  . Coronary artery bypass  graft  1999  . Coronary angioplasty with stent placement  2010    DES to the SVG-D3 (100%->0%). LAD 100%, CFX 90%, RCA 100%; LIMA-LAD OK, SVG-D1 100%, SVG-RCA OK  . Back surgery    . Hernia repair    . Dccv    . Cataract extraction      bilateral   Social History:  reports that he has never smoked. He has never used smokeless tobacco. He reports that he does not drink alcohol or use illicit drugs.   No Known Allergies  Family History  Problem Relation Age of Onset  . Heart failure Mother   . Heart attack Father   . Hypertension Father     Prior to Admission medications   Medication Sig Start Date End Date Taking? Authorizing Provider  amLODipine (NORVASC) 2.5 MG tablet Take 1 tablet (2.5 mg total) by mouth daily. 04/24/12 04/24/13 Yes Vesta Mixer, MD  aspirin 81 MG tablet Take 81 mg by mouth daily.     Yes Historical Provider, MD  Cholecalciferol (VITAMIN D-3) 5000 UNITS TABS Take 1 tablet by mouth daily.    Yes Historical Provider, MD  clopidogrel (PLAVIX) 75 MG tablet Take 1 tablet (75 mg total) by mouth daily. 02/21/12  Yes Loistine Chance  Earnstine Regal, MD  doxazosin (CARDURA) 4 MG tablet Take 1 tablet (4 mg total) by mouth at bedtime. 08/02/12  Yes Vesta Mixer, MD  famotidine (PEPCID) 10 MG tablet Take 10 mg by mouth every morning. For acid   Yes Historical Provider, MD  febuxostat (ULORIC) 40 MG tablet Take 40 mg by mouth daily as needed. For gout   Yes Historical Provider, MD  Ferrous Gluconate (IRON) 240 (27 FE) MG TABS Take 1 tablet by mouth daily.   Yes Historical Provider, MD  fish oil-omega-3 fatty acids 1000 MG capsule Take 1 g by mouth daily.    Yes Historical Provider, MD  furosemide (LASIX) 40 MG tablet Take 40-60 mg by mouth 2 (two) times daily. STARTING 10/10/11 taking 60 MG in morning and 40 mg in evening 01/08/12  Yes Vesta Mixer, MD  hydroxychloroquine (PLAQUENIL) 200 MG tablet Take 200 mg by mouth 2 (two) times daily.    Yes Historical Provider, MD  metoprolol  (LOPRESSOR) 50 MG tablet Take 1 tablet (50 mg total) by mouth 2 (two) times daily. 11/12/12  Yes Vesta Mixer, MD  rosuvastatin (CRESTOR) 10 MG tablet Take 10 mg by mouth every evening. 03/19/12  Yes Vesta Mixer, MD  warfarin (COUMADIN) 5 MG tablet Take 2.5-5 mg by mouth every evening. Takes 5mg  on Mon & Fri & 2.5mg  on all other days 10/02/12  Yes Vesta Mixer, MD  colchicine 0.6 MG tablet Take 0.6 mg by mouth daily as needed. For gout    Historical Provider, MD   Physical Exam: Filed Vitals:   12/13/12 1508 12/13/12 1620 12/13/12 1700  BP: 163/108 165/77 143/76  Pulse: 99 82 75  Resp: 22 17 22   SpO2: 98% 98% 99%    Constitutional: He is oriented to person, place, and time. He appears well-developed and well-nourished. No distress.   Head: Atraumatic.  Nose: Dried blood right nares, w slow ooze small amt blood. No heavy or brisk bleeding.  Neck: Neck supple. No tracheal deviation present.  Cardiovascular: irregular Pulmonary/Chest: Effort normal and breath sounds normal. No accessory muscle usage. No respiratory distress.  Abdominal: He exhibits no distension.  Musculoskeletal: Normal range of motion. He exhibits no edema.  Neurological: He is alert and oriented to person, place, and time.  Skin: Skin is warm and dry.  Psychiatric: He has a normal mood and affect.    Labs on Admission:  Basic Metabolic Panel:  Recent Labs Lab 12/13/12 1551  NA 143  K 3.8  CL 102  CO2 29  GLUCOSE 112*  BUN 42*  CREATININE 2.35*  CALCIUM 9.4   Liver Function Tests: No results found for this basename: AST, ALT, ALKPHOS, BILITOT, PROT, ALBUMIN,  in the last 168 hours No results found for this basename: LIPASE, AMYLASE,  in the last 168 hours No results found for this basename: AMMONIA,  in the last 168 hours CBC:  Recent Labs Lab 12/13/12 1240  WBC 7.8  NEUTROABS 6.2  HGB 10.9*  HCT 34.5*  MCV 88.7  PLT 130*   Cardiac Enzymes: No results found for this basename: CKTOTAL,  CKMB, CKMBINDEX, TROPONINI,  in the last 168 hours  BNP (last 3 results) No results found for this basename: PROBNP,  in the last 8760 hours CBG: No results found for this basename: GLUCAP,  in the last 168 hours  Radiological Exams on Admission: No results found.    Assessment/Plan Active Problems:   DM   Bleeding nose  1. Nose bleed- NPO after midnight- possible IR procedure in AM- reverse coumadin if ok with cardiology- FFP and vit K, INR in AM 2. CKD- stable, continue home meds 3. DM- SSI 4. HTN- tight control    Code Status: full Family Communication: patient/son at bedside Disposition Plan: obs  Time spent: 70 min  Alex Orozco [facility] Pager [phone]  If 7PM-7AM, please contact night-coverage [email or web address] Password [facility] 12/13/2012, 5:59 PM

## 2012-12-13 NOTE — ED Notes (Signed)
Ronny Flurry, PA with [facility] at bedside.

## 2012-12-14 ENCOUNTER — Observation Stay (HOSPITAL_COMMUNITY): Payer: Medicare Other

## 2012-12-14 DIAGNOSIS — R040 Epistaxis: Secondary | ICD-10-CM

## 2012-12-14 DIAGNOSIS — J9600 Acute respiratory failure, unspecified whether with hypoxia or hypercapnia: Secondary | ICD-10-CM

## 2012-12-14 DIAGNOSIS — J9601 Acute respiratory failure with hypoxia: Secondary | ICD-10-CM | POA: Diagnosis present

## 2012-12-14 LAB — BASIC METABOLIC PANEL
BUN: 46 mg/dL — ABNORMAL HIGH (ref 6–23)
CO2: 27 mEq/L (ref 19–32)
Chloride: 107 mEq/L (ref 96–112)
Creatinine, Ser: 2.02 mg/dL — ABNORMAL HIGH (ref 0.50–1.35)
GFR calc Af Amer: 34 mL/min — ABNORMAL LOW (ref >90)
Glucose, Bld: 87 mg/dL (ref 70–99)
Potassium: 3.2 mEq/L — ABNORMAL LOW (ref 3.5–5.1)

## 2012-12-14 LAB — CBC
HCT: 23.4 % — ABNORMAL LOW (ref 39.0–52.0)
HCT: 25.3 % — ABNORMAL LOW (ref 39.0–52.0)
Hemoglobin: 7.6 g/dL — ABNORMAL LOW (ref 13.0–17.0)
Hemoglobin: 8.4 g/dL — ABNORMAL LOW (ref 13.0–17.0)
MCH: 28.4 pg (ref 26.0–34.0)
MCH: 29.1 pg (ref 26.0–34.0)
MCHC: 32.5 g/dL (ref 30.0–36.0)
MCV: 87.3 fL (ref 78.0–100.0)
RBC: 2.89 MIL/uL — ABNORMAL LOW (ref 4.22–5.81)
RDW: 14.4 % (ref 11.5–15.5)

## 2012-12-14 LAB — GLUCOSE, CAPILLARY
Glucose-Capillary: 100 mg/dL — ABNORMAL HIGH (ref 70–99)
Glucose-Capillary: 92 mg/dL (ref 70–99)
Glucose-Capillary: 92 mg/dL (ref 70–99)

## 2012-12-14 LAB — PREPARE FRESH FROZEN PLASMA
Unit division: 0
Unit division: 0

## 2012-12-14 LAB — URINALYSIS, ROUTINE W REFLEX MICROSCOPIC
Glucose, UA: NEGATIVE mg/dL
Ketones, ur: NEGATIVE mg/dL
Protein, ur: 100 mg/dL — AB

## 2012-12-14 LAB — PROTIME-INR: Prothrombin Time: 20.6 seconds — ABNORMAL HIGH (ref 11.6–15.2)

## 2012-12-14 LAB — PREPARE RBC (CROSSMATCH)

## 2012-12-14 LAB — URINE MICROSCOPIC-ADD ON

## 2012-12-14 MED ORDER — SODIUM CHLORIDE 0.9 % IV SOLN
INTRAVENOUS | Status: DC
  Administered 2012-12-14 (×2): 75 mL/h via INTRAVENOUS

## 2012-12-14 MED ORDER — CHLORHEXIDINE GLUCONATE 0.12 % MT SOLN
15.0000 mL | Freq: Two times a day (BID) | OROMUCOSAL | Status: DC
  Administered 2012-12-14 – 2012-12-17 (×6): 15 mL via OROMUCOSAL
  Filled 2012-12-14 (×5): qty 15

## 2012-12-14 MED ORDER — FUROSEMIDE 10 MG/ML IJ SOLN
40.0000 mg | Freq: Two times a day (BID) | INTRAMUSCULAR | Status: DC
  Administered 2012-12-14 – 2012-12-16 (×5): 40 mg via INTRAVENOUS
  Filled 2012-12-14 (×8): qty 4

## 2012-12-14 MED ORDER — PROPOFOL 10 MG/ML IV EMUL
INTRAVENOUS | Status: AC
  Filled 2012-12-14: qty 100

## 2012-12-14 MED ORDER — METOPROLOL TARTRATE 50 MG PO TABS
50.0000 mg | ORAL_TABLET | Freq: Two times a day (BID) | ORAL | Status: DC
  Filled 2012-12-14 (×2): qty 1

## 2012-12-14 MED ORDER — SODIUM CHLORIDE 0.9 % IV SOLN
INTRAVENOUS | Status: DC | PRN
  Administered 2012-12-13 – 2012-12-14 (×2): via INTRAVENOUS

## 2012-12-14 MED ORDER — HYDROXYCHLOROQUINE SULFATE 200 MG PO TABS
200.0000 mg | ORAL_TABLET | Freq: Two times a day (BID) | ORAL | Status: DC
  Administered 2012-12-14 – 2012-12-16 (×5): 200 mg
  Filled 2012-12-14 (×6): qty 1

## 2012-12-14 MED ORDER — PROPOFOL 10 MG/ML IV EMUL
5.0000 ug/kg/min | INTRAVENOUS | Status: DC
  Administered 2012-12-14: 15 ug/kg/min via INTRAVENOUS
  Administered 2012-12-14 (×2): 10 ug/kg/min via INTRAVENOUS
  Administered 2012-12-15 (×3): 30 ug/kg/min via INTRAVENOUS
  Administered 2012-12-15: 25 ug/kg/min via INTRAVENOUS
  Administered 2012-12-16 (×2): 30 ug/kg/min via INTRAVENOUS
  Filled 2012-12-14 (×8): qty 100

## 2012-12-14 MED ORDER — VECURONIUM BROMIDE 10 MG IV SOLR
INTRAVENOUS | Status: DC | PRN
  Administered 2012-12-13: 4 mg via INTRAVENOUS
  Administered 2012-12-14: 3 mg via INTRAVENOUS

## 2012-12-14 MED ORDER — MIDAZOLAM BOLUS VIA INFUSION
1.0000 mg | INTRAVENOUS | Status: DC | PRN
  Filled 2012-12-14: qty 2

## 2012-12-14 MED ORDER — INSULIN ASPART 100 UNIT/ML ~~LOC~~ SOLN: 0.0000 [IU] | Freq: Three times a day (TID) | SUBCUTANEOUS | Status: DC

## 2012-12-14 MED ORDER — ACETAMINOPHEN 650 MG RE SUPP
650.0000 mg | Freq: Four times a day (QID) | RECTAL | Status: DC | PRN
  Filled 2012-12-14: qty 1

## 2012-12-14 MED ORDER — ACETAMINOPHEN 500 MG PO TABS
1000.0000 mg | ORAL_TABLET | Freq: Four times a day (QID) | ORAL | Status: DC | PRN
  Filled 2012-12-14 (×2): qty 2

## 2012-12-14 MED ORDER — AMLODIPINE BESYLATE 5 MG PO TABS
5.0000 mg | ORAL_TABLET | Freq: Every day | ORAL | Status: DC
  Administered 2012-12-14 – 2012-12-16 (×3): 5 mg via NASOGASTRIC
  Filled 2012-12-14 (×3): qty 1

## 2012-12-14 MED ORDER — FENTANYL CITRATE 0.05 MG/ML IJ SOLN
25.0000 ug | INTRAMUSCULAR | Status: DC | PRN
  Administered 2012-12-14 (×5): 100 ug via INTRAVENOUS
  Administered 2012-12-15: 50 ug via INTRAVENOUS
  Filled 2012-12-14 (×6): qty 2

## 2012-12-14 MED ORDER — HEPARIN SODIUM (PORCINE) 1000 UNIT/ML IJ SOLN
INTRAMUSCULAR | Status: DC | PRN
  Administered 2012-12-14: 500 [IU] via INTRAVENOUS
  Administered 2012-12-14: 1000 [IU] via INTRAVENOUS
  Administered 2012-12-14: 500 [IU] via INTRAVENOUS
  Administered 2012-12-14 (×2): 1000 [IU] via INTRAVENOUS

## 2012-12-14 MED ORDER — LABETALOL HCL 5 MG/ML IV SOLN
INTRAVENOUS | Status: DC | PRN
  Administered 2012-12-14: 5 mg via INTRAVENOUS

## 2012-12-14 MED ORDER — PANTOPRAZOLE SODIUM 40 MG IV SOLR
40.0000 mg | Freq: Every day | INTRAVENOUS | Status: DC
  Administered 2012-12-14 – 2012-12-16 (×3): 40 mg via INTRAVENOUS
  Filled 2012-12-14 (×4): qty 40

## 2012-12-14 MED ORDER — IOHEXOL 300 MG/ML  SOLN
150.0000 mL | Freq: Once | INTRAMUSCULAR | Status: AC | PRN
  Administered 2012-12-14: 120 mL via INTRA_ARTERIAL

## 2012-12-14 MED ORDER — CEFAZOLIN SODIUM-DEXTROSE 2-3 GM-% IV SOLR
INTRAVENOUS | Status: DC | PRN
  Administered 2012-12-14: 2 g via INTRAVENOUS

## 2012-12-14 MED ORDER — SODIUM CHLORIDE 0.9 % IV SOLN
1.0000 mg/h | INTRAVENOUS | Status: DC
  Administered 2012-12-14: 2 mg/h via INTRAVENOUS
  Filled 2012-12-14: qty 10

## 2012-12-14 MED ORDER — PROPOFOL 10 MG/ML IV BOLUS
INTRAVENOUS | Status: DC | PRN
  Administered 2012-12-13: 200 mg via INTRAVENOUS

## 2012-12-14 MED ORDER — VITAMIN K1 10 MG/ML IJ SOLN
10.0000 mg | Freq: Once | INTRAVENOUS | Status: AC
  Administered 2012-12-14: 10 mg via INTRAVENOUS
  Filled 2012-12-14: qty 1

## 2012-12-14 MED ORDER — PROPOFOL INFUSION 10 MG/ML OPTIME
INTRAVENOUS | Status: DC | PRN
  Administered 2012-12-14: 50 ug/kg/min via INTRAVENOUS

## 2012-12-14 MED ORDER — FENTANYL CITRATE 0.05 MG/ML IJ SOLN
INTRAMUSCULAR | Status: DC | PRN
  Administered 2012-12-13: 100 ug via INTRAVENOUS

## 2012-12-14 MED ORDER — FENTANYL CITRATE 0.05 MG/ML IJ SOLN
25.0000 ug | INTRAMUSCULAR | Status: DC | PRN
  Administered 2012-12-14: 50 ug via INTRAVENOUS
  Filled 2012-12-14: qty 2

## 2012-12-14 MED ORDER — BIOTENE DRY MOUTH MT LIQD
15.0000 mL | Freq: Four times a day (QID) | OROMUCOSAL | Status: DC
  Administered 2012-12-14 – 2012-12-17 (×13): 15 mL via OROMUCOSAL

## 2012-12-14 MED ORDER — ALBUTEROL SULFATE HFA 108 (90 BASE) MCG/ACT IN AERS
4.0000 | INHALATION_SPRAY | RESPIRATORY_TRACT | Status: DC | PRN
  Filled 2012-12-14: qty 6.7

## 2012-12-14 MED ORDER — SODIUM CHLORIDE 0.9 % IJ SOLN
1.5000 mg | INTRAVENOUS | Status: AC
  Filled 2012-12-14: qty 0.3

## 2012-12-14 MED ORDER — SUCCINYLCHOLINE CHLORIDE 20 MG/ML IJ SOLN
INTRAMUSCULAR | Status: DC | PRN
  Administered 2012-12-13: 120 mg via INTRAVENOUS

## 2012-12-14 MED ORDER — POTASSIUM CHLORIDE 10 MEQ/100ML IV SOLN
10.0000 meq | INTRAVENOUS | Status: AC
  Administered 2012-12-14 (×4): 10 meq via INTRAVENOUS
  Filled 2012-12-14: qty 300

## 2012-12-14 MED ORDER — METOPROLOL TARTRATE 25 MG PO TABS
25.0000 mg | ORAL_TABLET | Freq: Two times a day (BID) | ORAL | Status: DC
  Administered 2012-12-14 – 2012-12-16 (×5): 25 mg via NASOGASTRIC
  Filled 2012-12-14 (×6): qty 1

## 2012-12-14 MED ORDER — ONDANSETRON HCL 4 MG/2ML IJ SOLN: 4.0000 mg | Freq: Four times a day (QID) | INTRAMUSCULAR | Status: DC | PRN

## 2012-12-14 MED ORDER — MIDAZOLAM HCL 5 MG/5ML IJ SOLN
INTRAMUSCULAR | Status: DC | PRN
  Administered 2012-12-13 (×2): 1 mg via INTRAVENOUS

## 2012-12-14 MED ORDER — NICARDIPINE HCL IN NACL 20-0.86 MG/200ML-% IV SOLN
5.0000 mg/h | INTRAVENOUS | Status: DC
  Filled 2012-12-14: qty 200

## 2012-12-14 MED ORDER — DOXAZOSIN MESYLATE 4 MG PO TABS
4.0000 mg | ORAL_TABLET | Freq: Every day | ORAL | Status: DC
  Administered 2012-12-14 – 2012-12-15 (×2): 4 mg via NASOGASTRIC
  Filled 2012-12-14 (×3): qty 1

## 2012-12-14 MED ORDER — PROTAMINE SULFATE 10 MG/ML IV SOLN
INTRAVENOUS | Status: DC | PRN
  Administered 2012-12-14: 7.5 mg via INTRAVENOUS
  Administered 2012-12-14: 2.5 mg via INTRAVENOUS

## 2012-12-14 NOTE — Progress Notes (Signed)
INITIAL NUTRITION ASSESSMENT  DOCUMENTATION CODES Per approved criteria  -Obesity Unspecified   INTERVENTION: If pt remains intubated >48 hours recommend initiation TF: Initiate Vital AF 1.2 @ 20 ml/hr via OG tube and increase by 10 ml every 4 hours to goal rate of 30 ml/hr. 60 ml Prostat TID.  At goal rate, tube feeding regimen will provide 1464 kcal, 144 grams of protein, and 583 ml of H2O.    NUTRITION DIAGNOSIS: Inadequate oral intake related to inability to eat as evidenced by NPO status.   Goal: Enteral nutrition to provide 60-70% of estimated calorie needs (22-25 kcals/kg ideal body weight) and 100% of estimated protein needs, based on ASPEN guidelines for permissive underfeeding in critically ill obese individuals.   Monitor:  Vent Status TF initiation/tolerance Weight Labs  Reason for Assessment: Vent  79 y.o. male  Admitting Dx: Bleeding nose  ASSESSMENT: 79 years old male with A. Fib, CAD s/p CABG and stenting on Plavix, Aspirin and Coumadin. Presents with sever epistaxis. Unable to stop by ENt packing (shoemaker) Went to the IR suite for angiogram and embolization. Transferred intubated to the MICU. Critical care consult called to assist with management. Per family in the room pt was eating well PTA and he usually weighs 235 lbs. Pt does not follow any special diet and he has no dysphagia per family.  Height: Ht Readings from Last 1 Encounters:  12/13/12 5\' 8"  (1.727 m)    Weight: Wt Readings from Last 1 Encounters:  12/14/12 243 lb 6.2 oz (110.4 kg)    Ideal Body Weight: 154 lbs  % Ideal Body Weight: 158%  Wt Readings from Last 10 Encounters:  12/14/12 243 lb 6.2 oz (110.4 kg)  12/14/12 243 lb 6.2 oz (110.4 kg)  10/07/12 242 lb 1.9 oz (109.825 kg)  07/01/12 240 lb 6.4 oz (109.045 kg)  04/10/12 237 lb 1.9 oz (107.557 kg)  01/08/12 237 lb 12.8 oz (107.865 kg)  11/15/11 251 lb (113.853 kg)  10/31/11 243 lb (110.224 kg)  10/23/11 245 lb 12.8 oz  (111.494 kg)  10/04/11 246 lb (111.585 kg)    Usual Body Weight: 235 lbs  % Usual Body Weight: 103%  BMI:  Body mass index is 37.02 kg/(m^2).  Patient is currently intubated on ventilator support.  MV: 10.7  Temp:Temp (24hrs), Avg:98.2 F (36.8 C), Min:97.2 F (36.2 C), Max:99.8 F (37.7 C)  Propofol: 6.6 ml/hr (provides 174 kcal per 24 hours)   Estimated Nutritional Needs: Kcal: 2137 (goal 1610-9604) Protein: 140-154 grams Fluid: 2.8 L  Skin: +2 RLE and LLE edema  Diet Order: NPO  EDUCATION NEEDS: -No education needs identified at this time   Intake/Output Summary (Last 24 hours) at 12/14/12 1046 Last data filed at 12/14/12 1000  Gross per 24 hour  Intake 1867.14 ml  Output   1350 ml  Net 517.14 ml    Last BM: PTA  Labs:   Recent Labs Lab 12/13/12 1551 12/14/12 0328  NA 143 143  K 3.8 3.2*  CL 102 107  CO2 29 27  BUN 42* 46*  CREATININE 2.35* 2.02*  CALCIUM 9.4 8.2*  GLUCOSE 112* 87    CBG (last 3)   Recent Labs  12/13/12 2218 12/14/12 0321 12/14/12 0722  GLUCAP 98 86 100*    Scheduled Meds: . amLODipine  5 mg Per NG tube Daily  . antiseptic oral rinse  15 mL Mouth Rinse QID  .  ceFAZolin (ANCEF) IV  2 g Intravenous Once  . chlorhexidine  15 mL Mouth Rinse BID  . doxazosin  4 mg Per NG tube QHS  . furosemide  40 mg Intravenous BID  . hydroxychloroquine  200 mg Per Tube BID  . insulin aspart  0-15 Units Subcutaneous TID WC  . insulin aspart  0-5 Units Subcutaneous QHS  . metoprolol  25 mg Per NG tube BID  . nitroGLYCERIN 1.5mg /55ml(25 mcg/ml) - [facility]-IR  1.5 mg Intra-arterial to XRAY  . pantoprazole (PROTONIX) IV  40 mg Intravenous Daily  . phytonadione (VITAMIN K) IV  10 mg Intravenous Once  . sodium chloride  3 mL Intravenous Q12H  . sodium chloride  3 mL Intravenous Q12H    Continuous Infusions: . sodium chloride 75 mL/hr (12/14/12 0450)  . propofol 10 mcg/kg/min (12/14/12 1610)    Past Medical History  Diagnosis Date  .  Coronary artery disease     a.  s/p CABG;  b. NSTEMI 9/10:  LHC with S-RCA ok, S-D1 occluded (chronic), S-D3 occluded, L-LAD ok;  PCI:  Endeavour DES to S-D3;  c. 10/2011 Lexiscan MV EF 37%, medium sized fixed defect over basilar to mid lateral wall.  No ishcemia.   . Lung disease, restrictive   . History of PFTs 01/20/2009  . Pleural effusion   . Chronic renal insufficiency   . CHF (congestive heart failure)     a. Previous EF 40-50%;  b. Echo 08/2011: mild LVH, EF 55-65, grade 3 diast dysfxn, mild AS (mean 19), mild AI, mild BAE  . Hypertension   . Dyslipidemia   . Aortic stenosis     a. echo 4/13: mild AS with mean 19 mmHg  . Atrial fibrillation     a. Chronic coumadin  . Anemia   . Difficult intubation   . Shortness of breath   . Arthritis   . S/P CABG x 5 1990    Past Surgical History  Procedure Laterality Date  . Coronary artery bypass graft  1999  . Coronary angioplasty with stent placement  2010    DES to the SVG-D3 (100%->0%). LAD 100%, CFX 90%, RCA 100%; LIMA-LAD OK, SVG-D1 100%, SVG-RCA OK  . Back surgery    . Hernia repair    . Dccv    . Cataract extraction      bilateral    Ian Malkin RD, LDN Inpatient Clinical Dietitian Pager: [phone] After Hours Pager: [phone]

## 2012-12-14 NOTE — Progress Notes (Signed)
PULMONARY  / CRITICAL CARE MEDICINE  Name: Alex Orozco MRN: [id number] DOB: [birth year]    ADMISSION DATE:  12/13/2012 CONSULTATION DATE:  12/14/12  REFERRING MD :  Dr. Julieanne Cotton PRIMARY SERVICE: PCCM  CHIEF COMPLAINT:  Epistaxis, post angiogram and embolization by IR. Intubated.  BRIEF PATIENT DESCRIPTION:  79 years old male with A. Fib,  CAD s/p CABG and stenting on Plavix, Aspirin and Coumadin. Presents with sever epistaxis. Unable to stop by ENt packing (shoemaker) Went to the IR suite for angiogram and embolization. Transferred intubated to the MICU. Critical care consult called to assist with management.  SIGNIFICANT EVENTS / STUDIES:  Post angiogram and embolization for control of epistaxis.  LINES / TUBES: - Peripheral lines   ANTIBIOTICS: - Ancef  SUBJECTIVE:   VITAL SIGNS: Temp:  [97.2 F (36.2 C)-99.8 F (37.7 C)] 97.8 F (36.6 C) (07/26 0758) Pulse Rate:  [57-99] 99 (07/26 0843) Resp:  [14-32] 32 (07/26 0843) BP: (122-179)/(54-114) 168/79 mmHg (07/26 0843) SpO2:  [97 %-100 %] 100 % (07/26 0843) Arterial Line BP: (148-175)/(54-77) 151/55 mmHg (07/26 0600) FiO2 (%):  [40 %-60 %] 40 % (07/26 0843) Weight:  [108.138 kg (238 lb 6.4 oz)] 108.138 kg (238 lb 6.4 oz) (07/25 1957) HEMODYNAMICS:   VENTILATOR SETTINGS: Vent Mode:  [-] PRVC FiO2 (%):  [40 %-60 %] 40 % Set Rate:  [12 bmp-16 bmp] 12 bmp Vt Set:  [500 mL-550 mL] 500 mL PEEP:  [5 cmH20] 5 cmH20 Plateau Pressure:  [20 cmH20] 20 cmH20 INTAKE / OUTPUT: Intake/Output     07/25 0701 - 07/26 0700 07/26 0701 - 07/27 0700   I.V. (mL/kg) 1179.5 (10.9)    Blood 12.5    IV Piggyback 300    Total Intake(mL/kg) 1492 (13.8)    Urine (mL/kg/hr) 800 350 (1.8)   Total Output 800 350   Net +692 -350          PHYSICAL EXAMINATION: General: Intubated, sedated, agitated this am on versed gtt Eyes: Anicteric sclerae. ENT: Intubated. Trachea at midlime Lymph: No cervical, supraclavicular, or axillary  lymphadenopathy. Heart: Normal S1, S2. No murmurs, rubs, or gallops appreciated. No bruits, equal pulses. Lungs: Normal excursion, no dullness to percussion. Good air movement bilaterally, without wheezes or crackles.  Abdomen: Abdomen soft,  not distended, normoactive bowel sounds. No hepatosplenomegaly or masses. Musculoskeletal: No clubbing or synovitis. Skin: No rashes or lesions Neuro: Sedated,agitated int, non focal grossly  LABS:  Recent Labs Lab 12/09/12 1033 12/13/12 1240 12/13/12 1551 12/14/12 0328 12/14/12 0440  HGB  --  10.9*  --  7.6*  --   WBC  --  7.8  --  4.6  --   PLT  --  130*  --  124*  --   NA  --   --  143 143  --   K  --   --  3.8 3.2*  --   CL  --   --  102 107  --   CO2  --   --  29 27  --   GLUCOSE  --   --  112* 87  --   BUN  --   --  42* 46*  --   CREATININE  --   --  2.35* 2.02*  --   CALCIUM  --   --  9.4 8.2*  --   INR 3.0 2.43*  --  1.83*  --   PHART  --   --   --   --  7.523*  PCO2ART  --   --   --   --  31.7*  PO2ART  --   --   --   --  212.0*    Recent Labs Lab 12/13/12 2218 12/14/12 0321 12/14/12 0722  GLUCAP 98 86 100*    CXR: ETT in position, LUL ASD as prior ? scarring  ASSESSMENT / PLAN:  PULMONARY A: 1) Intubated for airway protection P:   - Mechanical ventilation - VAP order set - Albuterol PRN - Daily awakening and SBT in the morning  CARDIOVASCULAR A:  1) Uncontrolled hypertension 2) CAD post CABG and stenting on chronic plavix and aspirin 3) A. Fib on coumadin. Received FFP. P:  - Will continue home antihypertensives via NGT, decrease lopressor to 25 bid given brady with propofol - Will switch to IV lasix 40 mg BID - Will hold coumadin, aspirin and plavix for now (cardiology input appreciated) - IV hydralazine PRN    RENAL A:   1) CKD stable hypokalemia P:   - replete K  GASTROINTESTINAL A:   1) No issues P:   - GI prophylaxis with protonix  HEMATOLOGIC A:   1) Acute blood loss Coagulopathy  of coumadin P:  - Transfuse 1 U given Hb drop , aim for 8 & above given CAD -correct INR 1.8  vit K & 2 u FFP  INFECTIOUS A:   1) No evidence of acute infection P:   - Prophylactic Ancef  ENDOCRINE A:   1) DM P:   - Novolog sliding scale  NEUROLOGIC A:   1) Intubated, sedated. P:   - Avoid versed - high risk delerium -use propofol gtt - PRN fentanyl   I have personally obtained a history, examined the patient, evaluated laboratory and imaging results, formulated the assessment and plan and placed orders.  The patient is critically ill with multiple organ systems failure and requires high complexity decision making for assessment and support, frequent evaluation and titration of therapies, application of advanced monitoring technologies and extensive interpretation of multiple databases. Critical Care Time devoted to patient care services described in this note is 35 minutes.    ALVA,RAKESH V.  12/14/2012, 8:46 AM

## 2012-12-14 NOTE — Procedures (Signed)
S/P RT common carotid arteriogram ,and RTexternal arteriogram,followed by embolization of RT IMAX and RT infraorbital arteries , using PVA particles

## 2012-12-14 NOTE — Progress Notes (Signed)
PT. Hemoglobin dropped from 10.9 to 7.6 MD notify. No new order.

## 2012-12-14 NOTE — Progress Notes (Signed)
Patient does not have CASS tube.

## 2012-12-14 NOTE — Transfer of Care (Signed)
Immediate Anesthesia Transfer of Care Note  Patient: Alex Orozco  Procedure(s) Performed: Procedure(s): RADIOLOGY WITH ANESTHESIA (N/A)  Patient Location: ICU  Anesthesia Type:General  Level of Consciousness: Patient remains intubated per anesthesia plan  Airway & Oxygen Therapy: Patient remains intubated per anesthesia plan and Patient placed on Ventilator (see vital sign flow sheet for setting)  Post-op Assessment: Report given to PACU RN and Post -op Vital signs reviewed and stable  Post vital signs: Reviewed and stable  Complications: No apparent anesthesia complications

## 2012-12-14 NOTE — Anesthesia Procedure Notes (Signed)
Procedure Name: Intubation Date/Time: 12/13/2012 11:35 PM Performed by: Molli Hazard Pre-anesthesia Checklist: Patient identified, Emergency Drugs available, Suction available and Patient being monitored Patient Re-evaluated:Patient Re-evaluated prior to inductionOxygen Delivery Method: Circle system utilized Preoxygenation: Pre-oxygenation with 100% oxygen Intubation Type: IV induction, Rapid sequence and Cricoid Pressure applied Laryngoscope Size: Miller and 2 Grade View: Grade II Tube type: Oral Tube size: 7.5 mm Number of attempts: 1 Airway Equipment and Method: Bougie stylet Placement Confirmation: ETT inserted through vocal cords under direct vision,  positive ETCO2 and breath sounds checked- equal and bilateral Secured at: 23 cm Tube secured with: Tape Dental Injury: Teeth and Oropharynx as per pre-operative assessment

## 2012-12-14 NOTE — Progress Notes (Signed)
1 Day Post-Op  Subjective: Pt intubated, family in room; has had some additional epistatxis overnight but decreasing per nurse  Objective: Vital signs in last 24 hours: Temp:  [97.2 F (36.2 C)-99.8 F (37.7 C)] 98.4 F (36.9 C) (07/26 1308) Pulse Rate:  [57-99] 71 (07/26 1308) Resp:  [12-32] 18 (07/26 1308) BP: (114-179)/(54-114) 146/63 mmHg (07/26 1252) SpO2:  [97 %-100 %] 100 % (07/26 1252) Arterial Line BP: (135-175)/(53-77) 143/62 mmHg (07/26 1308) FiO2 (%):  [40 %-60 %] 40 % (07/26 1253) Weight:  [238 lb 6.4 oz (108.138 kg)-243 lb 6.2 oz (110.4 kg)] 243 lb 6.2 oz (110.4 kg) (07/26 0843)    Intake/Output from previous day: 07/25 0701 - 07/26 0700 In: 1492 [I.V.:1179.5; Blood:12.5; IV Piggyback:300] Out: 800 [Urine:800] Intake/Output this shift: Total I/O In: 1298.8 [I.V.:619.6; Blood:679.2] Out: 725 [Urine:725]  Sedated but no new neuro changes; rt groin CFA puncture site with intact sheath, area soft, no hematoma; intact distal pulses  Lab Results:   Recent Labs  12/13/12 1240 12/14/12 0328  WBC 7.8 4.6  HGB 10.9* 7.6*  HCT 34.5* 23.4*  PLT 130* 124*   BMET  Recent Labs  12/13/12 1551 12/14/12 0328  NA 143 143  K 3.8 3.2*  CL 102 107  CO2 29 27  GLUCOSE 112* 87  BUN 42* 46*  CREATININE 2.35* 2.02*  CALCIUM 9.4 8.2*   PT/INR  Recent Labs  12/13/12 1240 12/14/12 0328  LABPROT 25.6* 20.6*  INR 2.43* 1.83*   ABG  Recent Labs  12/14/12 0440  PHART 7.523*  HCO3 26.1*    Studies/Results: Dg Chest Port 1 View  12/14/2012   *RADIOLOGY REPORT*  Clinical Data: Intubation  PORTABLE CHEST - 1 VIEW  Comparison: Prior radiograph from 08/24/2011  Findings: Has been interval placement of an endotracheal tube with tip located approximately 3 cm above the carina.  Enteric tube courses into the abdomen with tip and side hole well located well below the GE junction.  Median sternotomy wires of underlying markers and surgical clips are unchanged.   Cardiomegaly is likely not significantly changed allowing for differences in technique.  The lungs are mildly hypoinflated.  Atelectasis within the left perihilar region is similar.  There is diffuse pulmonary vascular congestion with peribronchial cuffing and small bilateral pleural effusions.  Osseous structures are unchanged.  IMPRESSION: 1. Tip of endotracheal tube 3 cm above the carina.  Additional support lines as above. 2.  Cardiomegaly with pulmonary vascular congestion and small bilateral pleural effusions, most consistent with CHF exacerbation.   Original Report Authenticated By: Rise Mu, M.D.    Anti-infectives: Anti-infectives   Start     Dose/Rate Route Frequency Ordered Stop   12/14/12 1000  hydroxychloroquine (PLAQUENIL) tablet 200 mg     200 mg Per Tube 2 times daily 12/14/12 0411     12/14/12 0000  ceFAZolin (ANCEF) IVPB 1 g/50 mL premix  Status:  Discontinued    Comments:  In IR   1 g 100 mL/hr over 30 Minutes Intravenous  Once 12/13/12 2356 12/13/12 2358   12/14/12 0000  ceFAZolin (ANCEF) IVPB 2 g/50 mL premix    Comments:  In IR   2 g 100 mL/hr over 30 Minutes Intravenous  Once 12/13/12 2358     12/13/12 2200  hydroxychloroquine (PLAQUENIL) tablet 200 mg  Status:  Discontinued     200 mg Oral 2 times daily 12/13/12 1940 12/14/12 0411      Assessment/Plan: S/p rt CCA angio with embo via  PVA particles of rt IMAX/infraorbital arteries secondary to persistent epistaxis; FFP/vit K per CCM; transfuse/hydrate; PT/INR too elevated to remove arterial sheath now; will recheck coags in am and plan removal if stable; cards consult for anticoagulation management per CCM   LOS: 1 day    ALLRED,D [facility] 12/14/2012

## 2012-12-14 NOTE — Anesthesia Postprocedure Evaluation (Signed)
  Anesthesia Post-op Note  Patient: Alex Orozco  Procedure(s) Performed: Procedure(s): RADIOLOGY WITH ANESTHESIA (N/A)  Patient Location: ICU  Anesthesia Type:General  Level of Consciousness: sedated and Patient remains intubated per anesthesia plan  Airway and Oxygen Therapy: Patient remains intubated per anesthesia plan and Patient placed on Ventilator (see vital sign flow sheet for setting)  Post-op Pain: none  Post-op Assessment: Post-op Vital signs reviewed, Patient's Cardiovascular Status Stable, Respiratory Function Stable, Patent Airway, No signs of Nausea or vomiting and Pain level controlled  Post-op Vital Signs: stable  Complications: No apparent anesthesia complications

## 2012-12-14 NOTE — Progress Notes (Signed)
eLink Physician-Brief Progress Note Patient Name: Alex Orozco DOB: [birth year] MRN: [id number]  Date of Service  12/14/2012   HPI/Events of Note  Hypokalemia   eICU Interventions  Potassium replaced   Intervention Category Minor Interventions: Electrolytes abnormality - evaluation and management  Nakeia Calvi 12/14/2012, 4:06 AM

## 2012-12-14 NOTE — Progress Notes (Signed)
12/13/12 Pt.arrived on the unit around 2000. He was transported by stretcher without oxygen and without IV pump. Pt. A/Ox4 but is hearing impaired. He had no c/o pain. He is ambulatory and was able to ambulate from stretcher to bed. Family at bedside. Pt.continued to have  bloody nasal drainage while on the unit. Pt.received vitamin K+ and 1 unit of plasma. Was notified by interventional radiology that patient would be leaving for procedure tonight and that the 2nd unit of plasma and procedure consent would be done while there. Family accompanied the patient to interventional radiology. Notified again by interventional radiology that patient will not be returning to the unit but would be admitted to unit 2100 room 2105. Report called to the RN and patient's belongings were sent to the unit.

## 2012-12-14 NOTE — Consult Note (Signed)
PULMONARY  / CRITICAL CARE MEDICINE  Name: Alex Orozco MRN: [id number] DOB: 1930-04-21    ADMISSION DATE:  12/13/2012 CONSULTATION DATE:  12/14/12  REFERRING MD :  Dr. Julieanne Cotton PRIMARY SERVICE: PCCM  CHIEF COMPLAINT:  Epistaxis, post angiogram and embolization by IR. Intubated.  BRIEF PATIENT DESCRIPTION:  79 years old male with A. Fib,  CAD s/p CABG and stenting on Plavix, Aspirin and Coumadin. Presents with sever epistaxis. Went to the IR suite for angiogram and embolization. Transferred intubated to the MICU. Critical care consult called to assist with management.  SIGNIFICANT EVENTS / STUDIES:  Post angiogram and embolization for control of epistaxis.  LINES / TUBES: - Peripheral lines   ANTIBIOTICS: - Ancef  HISTORY OF PRESENT ILLNESS:   79 years old male with A. Fib and CAD post CABG and stenting. Chronically on coumadin, plavix and aspirin.His PMH is also relevant for HTN, CKD, DM, aortic stenosis and OSA. Presents with one day history of epistaxis. Went to ENT and they were not able to stop the bleeding. He was referred to the ED for evaluation by IR. He was admitted by Internal medicine ad received FFP and vitamin K for reversal of anticoagulation. He was taken to the IR suite for angiogram and embolization under general anesthesia. After the procedure he was transferred to the MICU intubated and sedated. Critical care consult called to assist with management.   PAST MEDICAL HISTORY :  Past Medical History  Diagnosis Date  . Coronary artery disease     a.  s/p CABG;  b. NSTEMI 9/10:  LHC with S-RCA ok, S-D1 occluded (chronic), S-D3 occluded, L-LAD ok;  PCI:  Endeavour DES to S-D3;  c. 10/2011 Lexiscan MV EF 37%, medium sized fixed defect over basilar to mid lateral wall.  No ishcemia.   . Lung disease, restrictive   . History of PFTs 01/20/2009  . Pleural effusion   . Chronic renal insufficiency   . CHF (congestive heart failure)     a. Previous EF 40-50%;  b.  Echo 08/2011: mild LVH, EF 55-65, grade 3 diast dysfxn, mild AS (mean 19), mild AI, mild BAE  . Hypertension   . Dyslipidemia   . Aortic stenosis     a. echo 4/13: mild AS with mean 19 mmHg  . Atrial fibrillation     a. Chronic coumadin  . Anemia   . Difficult intubation   . Shortness of breath   . Arthritis   . S/P CABG x 5 1990   Past Surgical History  Procedure Laterality Date  . Coronary artery bypass graft  1999  . Coronary angioplasty with stent placement  2010    DES to the SVG-D3 (100%->0%). LAD 100%, CFX 90%, RCA 100%; LIMA-LAD OK, SVG-D1 100%, SVG-RCA OK  . Back surgery    . Hernia repair    . Dccv    . Cataract extraction      bilateral   Prior to Admission medications   Medication Sig Start Date End Date Taking? Authorizing Provider  amLODipine (NORVASC) 2.5 MG tablet Take 1 tablet (2.5 mg total) by mouth daily. 04/24/12 04/24/13 Yes Vesta Mixer, MD  aspirin 81 MG tablet Take 81 mg by mouth daily.     Yes Historical Provider, MD  Cholecalciferol (VITAMIN D-3) 5000 UNITS TABS Take 1 tablet by mouth daily.    Yes Historical Provider, MD  clopidogrel (PLAVIX) 75 MG tablet Take 1 tablet (75 mg total) by mouth daily. 02/21/12  Yes  Vesta Mixer, MD  doxazosin (CARDURA) 4 MG tablet Take 1 tablet (4 mg total) by mouth at bedtime. 08/02/12  Yes Vesta Mixer, MD  famotidine (PEPCID) 10 MG tablet Take 10 mg by mouth every morning. For acid   Yes Historical Provider, MD  febuxostat (ULORIC) 40 MG tablet Take 40 mg by mouth daily as needed. For gout   Yes Historical Provider, MD  Ferrous Gluconate (IRON) 240 (27 FE) MG TABS Take 1 tablet by mouth daily.   Yes Historical Provider, MD  fish oil-omega-3 fatty acids 1000 MG capsule Take 1 g by mouth daily.    Yes Historical Provider, MD  furosemide (LASIX) 40 MG tablet Take 40-60 mg by mouth 2 (two) times daily. STARTING 10/10/11 taking 60 MG in morning and 40 mg in evening 01/08/12  Yes Vesta Mixer, MD  hydroxychloroquine  (PLAQUENIL) 200 MG tablet Take 200 mg by mouth 2 (two) times daily.    Yes Historical Provider, MD  metoprolol (LOPRESSOR) 50 MG tablet Take 1 tablet (50 mg total) by mouth 2 (two) times daily. 11/12/12  Yes Vesta Mixer, MD  rosuvastatin (CRESTOR) 10 MG tablet Take 10 mg by mouth every evening. 03/19/12  Yes Vesta Mixer, MD  warfarin (COUMADIN) 5 MG tablet Take 2.5-5 mg by mouth every evening. Takes 5mg  on Mon & Fri & 2.5mg  on all other days 10/02/12  Yes Vesta Mixer, MD  colchicine 0.6 MG tablet Take 0.6 mg by mouth daily as needed. For gout    Historical Provider, MD   No Known Allergies  FAMILY HISTORY:  Family History  Problem Relation Age of Onset  . Heart failure Mother   . Heart attack Father   . Hypertension Father    SOCIAL HISTORY:  reports that he has never smoked. He has never used smokeless tobacco. He reports that he does not drink alcohol or use illicit drugs.  REVIEW OF SYSTEMS:  Unable to provide.  SUBJECTIVE:   VITAL SIGNS: Temp:  [97.2 F (36.2 C)-99.8 F (37.7 C)] 97.5 F (36.4 C) (07/26 0329) Pulse Rate:  [63-99] 63 (07/26 0305) Resp:  [14-22] 16 (07/26 0305) BP: (126-179)/(64-114) 162/68 mmHg (07/26 0305) SpO2:  [97 %-100 %] 100 % (07/26 0305) FiO2 (%):  [60 %] 60 % (07/26 0305) Weight:  [238 lb 6.4 oz (108.138 kg)] 238 lb 6.4 oz (108.138 kg) (07/25 1957) HEMODYNAMICS:   VENTILATOR SETTINGS: Vent Mode:  [-] PRVC FiO2 (%):  [60 %] 60 % Set Rate:  [16 bmp] 16 bmp Vt Set:  [550 mL] 550 mL PEEP:  [5 cmH20] 5 cmH20 Plateau Pressure:  [20 cmH20] 20 cmH20 INTAKE / OUTPUT: Intake/Output     07/25 0701 - 07/26 0700   I.V. (mL/kg) 1006 (9.3)   Blood 12.5   Total Intake(mL/kg) 1018.5 (9.4)   Urine (mL/kg/hr) 600   Total Output 600   Net +418.5         PHYSICAL EXAMINATION: General: Intubated, sedated, no apparent distress. Eyes: Anicteric sclerae. ENT: Intubated. Trachea at midlime Lymph: No cervical, supraclavicular, or axillary  lymphadenopathy. Heart: Normal S1, S2. No murmurs, rubs, or gallops appreciated. No bruits, equal pulses. Lungs: Normal excursion, no dullness to percussion. Good air movement bilaterally, without wheezes or crackles.  Abdomen: Abdomen soft,  not distended, normoactive bowel sounds. No hepatosplenomegaly or masses. Musculoskeletal: No clubbing or synovitis. Skin: No rashes or lesions Neuro: Sedated, paralyzed.  LABS:  Recent Labs Lab 12/09/12 1033 12/13/12 1240 12/13/12 1551  HGB  --  10.9*  --   WBC  --  7.8  --   PLT  --  130*  --   NA  --   --  143  K  --   --  3.8  CL  --   --  102  CO2  --   --  29  GLUCOSE  --   --  112*  BUN  --   --  42*  CREATININE  --   --  2.35*  CALCIUM  --   --  9.4  INR 3.0 2.43*  --     Recent Labs Lab 12/13/12 2218  GLUCAP 98    CXR: Will order.  ASSESSMENT / PLAN:  PULMONARY A: 1) Intubated for airway protection P:   - Mechanical ventilation   - PRVC, Vt: 8cc/kg, PEEP: 5, RR: 16, FiO2: 100% and adjust to keep O2 sat > 94% - VAP order set - Albuterol PRN - Daily awakening and SBT in the morning  CARDIOVASCULAR A:  1) Uncontrolled hypertension 2) CAD post CABG and stenting on chronic plavix and aspirin 3) A. Fib on coumadin. Received FFP. P:  - Will continue home antihypertensives via NGT - Will switch to IV lasix 40 mg BID - Coumadin reversed with FFP and vit K. Will check INR - Will hold coumadin, aspirin and plavix for now (cardiology input appreciated) - IV hydralazine PRN    RENAL A:   1) CKD stable P:   - We will follow CMP  GASTROINTESTINAL A:   1) No issues P:   - GI prophylaxis with protonix  HEMATOLOGIC A:   1) Acute blood loss P:  - Will check CBC  INFECTIOUS A:   1) No evidence of acute infection P:   - Prophylactic Ancef  ENDOCRINE A:   1) DM P:   - Novolog sliding scale  NEUROLOGIC A:   1) Intubated, sedated. P:   - Continuos sedation with Versed - PRN fentanyl   I have  personally obtained a history, examined the patient, evaluated laboratory and imaging results, formulated the assessment and plan and placed orders. CRITICAL CARE: Critical Care Time devoted to patient care services described in this note is 60 minutes.   Overton Mam, MD Pulmonary and Critical Care Medicine [facility] Pager: [phone]  12/14/2012, 3:39 AM

## 2012-12-14 NOTE — Progress Notes (Signed)
Spoke with IR supervisor Melinda around [phone] and stated that per Dr. Algis Downs the removal of the sheath will be reevaluated tomorrow after the morning coagulation labs result. The removal will also depend on the status of the nasal drainage.

## 2012-12-15 ENCOUNTER — Inpatient Hospital Stay (HOSPITAL_COMMUNITY): Payer: Medicare Other

## 2012-12-15 DIAGNOSIS — I4891 Unspecified atrial fibrillation: Secondary | ICD-10-CM

## 2012-12-15 DIAGNOSIS — R040 Epistaxis: Principal | ICD-10-CM

## 2012-12-15 DIAGNOSIS — J9600 Acute respiratory failure, unspecified whether with hypoxia or hypercapnia: Secondary | ICD-10-CM

## 2012-12-15 LAB — BASIC METABOLIC PANEL
Chloride: 109 mEq/L (ref 96–112)
GFR calc Af Amer: 32 mL/min — ABNORMAL LOW (ref >90)
GFR calc non Af Amer: 28 mL/min — ABNORMAL LOW (ref >90)
Glucose, Bld: 90 mg/dL (ref 70–99)
Potassium: 3.4 mEq/L — ABNORMAL LOW (ref 3.5–5.1)
Sodium: 145 mEq/L (ref 135–145)

## 2012-12-15 LAB — TYPE AND SCREEN
ABO/RH(D): O NEG
Antibody Screen: NEGATIVE

## 2012-12-15 LAB — CBC
Hemoglobin: 8.3 g/dL — ABNORMAL LOW (ref 13.0–17.0)
RBC: 2.82 MIL/uL — ABNORMAL LOW (ref 4.22–5.81)

## 2012-12-15 LAB — PREPARE FRESH FROZEN PLASMA: Unit division: 0

## 2012-12-15 LAB — GLUCOSE, CAPILLARY
Glucose-Capillary: 90 mg/dL (ref 70–99)
Glucose-Capillary: 92 mg/dL (ref 70–99)
Glucose-Capillary: 94 mg/dL (ref 70–99)

## 2012-12-15 LAB — TRIGLYCERIDES: Triglycerides: 158 mg/dL — ABNORMAL HIGH (ref <150)

## 2012-12-15 MED ORDER — INSULIN ASPART 100 UNIT/ML ~~LOC~~ SOLN
0.0000 [IU] | SUBCUTANEOUS | Status: DC
  Administered 2012-12-16: 2 [IU] via SUBCUTANEOUS

## 2012-12-15 MED ORDER — ACETAMINOPHEN 160 MG/5ML PO SOLN
650.0000 mg | Freq: Four times a day (QID) | ORAL | Status: DC | PRN
  Administered 2012-12-15: 650 mg

## 2012-12-15 MED ORDER — POTASSIUM CHLORIDE 20 MEQ/15ML (10%) PO LIQD
20.0000 meq | Freq: Once | ORAL | Status: AC
Start: 2012-12-15 — End: 2012-12-15
  Administered 2012-12-15: 20 meq
  Filled 2012-12-15: qty 15

## 2012-12-15 MED ORDER — HALOPERIDOL LACTATE 5 MG/ML IJ SOLN
5.0000 mg | Freq: Once | INTRAMUSCULAR | Status: DC
  Filled 2012-12-15: qty 1

## 2012-12-15 NOTE — Progress Notes (Signed)
2 Days Post-Op  Subjective: Pt intubated, no reports of further epistaxis per nurse; has had oozing from rt arterial sheath overnight  Objective: Vital signs in last 24 hours: Temp:  [97.5 F (36.4 C)-101.2 F (38.4 C)] 101.2 F (38.4 C) (07/27 0445) Pulse Rate:  [71-92] 75 (07/27 1000) Resp:  [0-24] 20 (07/27 0756) BP: (105-155)/(53-99) 118/55 mmHg (07/27 1000) SpO2:  [100 %] 100 % (07/27 1000) Arterial Line BP: (120-163)/(50-69) 123/53 mmHg (07/27 1000) FiO2 (%):  [40 %] 40 % (07/27 1000)    Intake/Output from previous day: 07/26 0701 - 07/27 0700 In: 2290.2 [I.V.:1194.4; Blood:1045.8; IV Piggyback:50] Out: 2300 [Urine:2200; Drains:100] Intake/Output this shift: Total I/O In: 50.2 [I.V.:50.2] Out: 550 [Urine:550]  Rt groin sheath intact, gauze dressing covered with clotted blood; dressing removed and preparations being made for removal now  Lab Results:   Recent Labs  12/14/12 1700 12/15/12 0429  WBC 8.1 7.4  HGB 8.4* 8.3*  HCT 25.3* 25.2*  PLT 117* 120*   BMET  Recent Labs  12/14/12 0328 12/15/12 0429  NA 143 145  K 3.2* 3.4*  CL 107 109  CO2 27 28  GLUCOSE 87 90  BUN 46* 40*  CREATININE 2.02* 2.12*  CALCIUM 8.2* 8.3*   PT/INR  Recent Labs  12/14/12 0328 12/15/12 0429  LABPROT 20.6* 15.5*  INR 1.83* 1.26   ABG  Recent Labs  12/14/12 0440  PHART 7.523*  HCO3 26.1*    Studies/Results: Dg Chest Port 1 View  12/15/2012   *RADIOLOGY REPORT*  Clinical Data: Evaluate endotracheal tube position.  PORTABLE CHEST - 1 VIEW  Comparison: Chest x-ray 12/14/2012.  Findings: An endotracheal tube is in place with tip 4.3 cm above the carina. A nasogastric tube is seen extending into the stomach, however, the tip of the nasogastric tube extends below the lower margin of the image.  Status post median sternotomy for CABG including LIMA.  Lung volumes are low. There is cephalization of the pulmonary vasculature and slight indistinctness of the interstitial  markings suggestive of mild pulmonary edema.  Small left pleural effusion.  Mild enlargement of cardiopericardial silhouette is unchanged. The patient is rotated to the left on today's exam, resulting in distortion of the mediastinal contours and reduced diagnostic sensitivity and specificity for mediastinal pathology.  Atherosclerosis in the thoracic aorta.  IMPRESSION: 1.  Support apparatus and postoperative changes, as above. 2.  Mild interstitial pulmonary edema. 3.  Mild enlargement of the cardiopericardial silhouette is unchanged. 4.  Small left pleural effusion. 5.  Atherosclerosis.   Original Report Authenticated By: Trudie Reed, M.D.   Dg Chest Port 1 View  12/14/2012   *RADIOLOGY REPORT*  Clinical Data: Intubation  PORTABLE CHEST - 1 VIEW  Comparison: Prior radiograph from 08/24/2011  Findings: Has been interval placement of an endotracheal tube with tip located approximately 3 cm above the carina.  Enteric tube courses into the abdomen with tip and side hole well located well below the GE junction.  Median sternotomy wires of underlying markers and surgical clips are unchanged.  Cardiomegaly is likely not significantly changed allowing for differences in technique.  The lungs are mildly hypoinflated.  Atelectasis within the left perihilar region is similar.  There is diffuse pulmonary vascular congestion with peribronchial cuffing and small bilateral pleural effusions.  Osseous structures are unchanged.  IMPRESSION: 1. Tip of endotracheal tube 3 cm above the carina.  Additional support lines as above. 2.  Cardiomegaly with pulmonary vascular congestion and small bilateral pleural effusions, most  consistent with CHF exacerbation.   Original Report Authenticated By: Rise Mu, M.D.    Anti-infectives: Anti-infectives   Start     Dose/Rate Route Frequency Ordered Stop   12/14/12 1000  hydroxychloroquine (PLAQUENIL) tablet 200 mg     200 mg Per Tube 2 times daily 12/14/12 0411      12/14/12 0000  ceFAZolin (ANCEF) IVPB 1 g/50 mL premix  Status:  Discontinued    Comments:  In IR   1 g 100 mL/hr over 30 Minutes Intravenous  Once 12/13/12 2356 12/13/12 2358   12/14/12 0000  ceFAZolin (ANCEF) IVPB 2 g/50 mL premix    Comments:  In IR   2 g 100 mL/hr over 30 Minutes Intravenous  Once 12/13/12 2358     12/13/12 2200  hydroxychloroquine (PLAQUENIL) tablet 200 mg  Status:  Discontinued     200 mg Oral 2 times daily 12/13/12 1940 12/14/12 0411      Assessment/Plan: s/p cerebral angio with embo of rt IMAX/infraorbital arteries secondary to persistent epistaxis 7/25; remove arterial sheath today(coags ok); cards f/u for anticoagulation management; other plans as per CCM  LOS: 2 days    ALLRED,D [facility] 12/15/2012

## 2012-12-15 NOTE — Progress Notes (Signed)
Bleeding note on patient,s right femoral sheath. Dressing a little saturated. Patient placed flat and sand bag applied on groin. MD notify. Order for coag. , and call interventional radiology. After many attempts finally got in touch with IR Doctor. Order to leave patient flat for 4 hours and continue sand bag. Will continue to monitor . Vs remain stable.  12/15/12 0300 12/15/12 0400 12/15/12 0500  Art Line  Arterial Line BP 139/57 mmHg 135/55 mmHg 136/55 mmHg  Arterial Line MAP (mmHg) 83 mmHg 80 mmHg 81 mmHg  Arterial Line Location --  Left radial --   Art Line Wave Form --  Appropriate wave forms --   Art Line (2)  Arterial Line BP 2 139/54 mmHg 136/52 mmHg 141/56 mmHg  Arterial Line MAP (mmHg) 82 mmHg 80 mmHg 84 mmHg  Arterial Line Location 2 --  Right femoral --   Art Line Wave Form 1 --  Appropriate wave forms --      12/15/12 0600  Art Line  Arterial Line BP 120/50 mmHg  Arterial Line MAP (mmHg) 72 mmHg  Arterial Line Location --   Art Line Wave Form --   Art Line (2)  Arterial Line BP 2 127/52 mmHg  Arterial Line MAP (mmHg) 76 mmHg  Arterial Line Location 2 --   Art Line Wave Form 1 --

## 2012-12-15 NOTE — Progress Notes (Signed)
26fr RFA sheath removed using an Exoseal closure device.  Sheath prepped and draped in a sterile fashion.  6FR Exoseal utilized for hemostasis.  Manual pressure held for 10 minutes after utilization of closure device.  Hemostasis confirmed at 1130.  Groin site soft with no hematoma noted.  Distal pulses intact.  Groin and pulses reviewed with Minette Brine RN.  Pressure dressing and sandbag in place.  Florita Nitsch RT R CV

## 2012-12-15 NOTE — Progress Notes (Addendum)
PULMONARY  / CRITICAL CARE MEDICINE  Name: Alex Orozco MRN: [id number] DOB: June 15, 1929    ADMISSION DATE:  12/13/2012 CONSULTATION DATE:  12/14/12  REFERRING MD :  Dr. Julieanne Cotton PRIMARY SERVICE: PCCM  CHIEF COMPLAINT:  Epistaxis, post angiogram and embolization by IR. Intubated.  BRIEF PATIENT DESCRIPTION:  79 years old male with A. Fib,  CAD s/p CABG and stenting on Plavix, Aspirin and Coumadin. Presents with sever epistaxis. Unable to stop by ENt packing (shoemaker) Went to the IR suite for angiogram and embolization. Transferred intubated to the MICU. Critical care consult called to assist with management.  SIGNIFICANT EVENTS / STUDIES:  7/26 >>Post angiogram and embolization RT IMAX and RT infraorbital arteries  for control of epistaxis.  LINES / TUBES: - Peripheral lines   ANTIBIOTICS: - Ancef  SUBJECTIVE: agitated on WUA  afebrile No blood in OGT or oral suction  VITAL SIGNS: Temp:  [97.4 F (36.3 C)-101.2 F (38.4 C)] 101.2 F (38.4 C) (07/27 0445) Pulse Rate:  [67-92] 80 (07/27 0800) Resp:  [0-24] 20 (07/27 0756) BP: (105-155)/(53-99) 143/69 mmHg (07/27 0800) SpO2:  [100 %] 100 % (07/27 0800) Arterial Line BP: (120-163)/(50-69) 153/61 mmHg (07/27 0800) FiO2 (%):  [40 %] 40 % (07/27 0800) HEMODYNAMICS:   VENTILATOR SETTINGS: Vent Mode:  [-] CPAP;PSV FiO2 (%):  [40 %] 40 % Set Rate:  [12 bmp] 12 bmp Vt Set:  [500 mL] 500 mL PEEP:  [5 cmH20] 5 cmH20 Pressure Support:  [8 cmH20] 8 cmH20 Plateau Pressure:  [19 cmH20-20 cmH20] 19 cmH20 INTAKE / OUTPUT: Intake/Output     07/26 0701 - 07/27 0700 07/27 0701 - 07/28 0700   I.V. (mL/kg) 1194.4 (10.8) 17.1 (0.2)   Blood 1045.8    IV Piggyback 50    Total Intake(mL/kg) 2290.2 (20.7) 17.1 (0.2)   Urine (mL/kg/hr) 2200 (0.8) 150 (0.5)   Drains 100 (0)    Total Output 2300 150   Net -9.8 -133          PHYSICAL EXAMINATION: General: Intubated, sedated,calm on propofol gtt Eyes: Anicteric  sclerae. ENT: Intubated. Trachea at midline, nasal pack Lymph: No cervical, supraclavicular, or axillary lymphadenopathy. Heart: Normal S1, S2. No murmurs, rubs, or gallops appreciated. No bruits, equal pulses. Lungs: Normal excursion, no dullness to percussion. Good air movement bilaterally, without wheezes or crackles.  Abdomen: Abdomen soft,  not distended, normoactive bowel sounds. No hepatosplenomegaly or masses. Musculoskeletal: No clubbing or synovitis. Skin: No rashes or lesions Neuro: Sedated,agitated int, non focal grossly  LABS:  Recent Labs Lab 12/13/12 1240  12/13/12 1551 12/14/12 0328 12/14/12 0440 12/14/12 1700 12/15/12 0429  HGB 10.9*  --   --  7.6*  --  8.4* 8.3*  WBC 7.8  --   --  4.6  --  8.1 7.4  PLT 130*  --   --  124*  --  117* 120*  NA  --   --  143 143  --   --  145  K  --   < > 3.8 3.2*  --   --  3.4*  CL  --   --  102 107  --   --  109  CO2  --   --  29 27  --   --  28  GLUCOSE  --   --  112* 87  --   --  90  BUN  --   --  42* 46*  --   --  40*  CREATININE  --   --  2.35* 2.02*  --   --  2.12*  CALCIUM  --   --  9.4 8.2*  --   --  8.3*  INR 2.43*  --   --  1.83*  --   --  1.26  PHART  --   --   --   --  7.523*  --   --   PCO2ART  --   --   --   --  31.7*  --   --   PO2ART  --   --   --   --  212.0*  --   --   < > = values in this interval not displayed.  Recent Labs Lab 12/14/12 1543 12/14/12 1915 12/14/12 2340 12/15/12 0347 12/15/12 0813  GLUCAP 94 92 92 90 94    CXR: ETT in position, LUL ASD as prior ? scarring  ASSESSMENT / PLAN:  PULMONARY A: 1) Intubated for airway protection P:   - VAP order set - Albuterol PRN - Daily awakening and SBT after sheath removal with plan to extubate  CARDIOVASCULAR A:  1) Uncontrolled hypertension 2) CAD post CABG and stenting on chronic plavix and aspirin 3) A. Fib on coumadin. Received FFP. P:  - Will continue home antihypertensives via NGT, decrease lopressor to 25 bid given brady with  propofol - Will switch to IV lasix 40 mg BID - Will hold coumadin, aspirin and plavix for now (cardiology input appreciated) - IV hydralazine PRN    RENAL A:   1) CKD stable hypokalemia P:   - replete K  GASTROINTESTINAL A:   1) No issues P:   - GI prophylaxis with protonix  HEMATOLOGIC A:   1) Acute blood loss Coagulopathy of coumadin -corrected wtih vit K & FFP P:  - aim for 8 & above given CAD   INFECTIOUS A:   1) No evidence of acute infection P:   - Prophylactic Ancef  ENDOCRINE A:   1) DM P:   - Novolog sliding scale  NEUROLOGIC A:   Intubated, sedated. Hard of hearing P:   - Avoid versed - high risk delerium -use propofol gtt - PRN fentanyl   I have personally obtained a history, examined the patient, evaluated laboratory and imaging results, formulated the assessment and plan and placed orders.  The patient is critically ill with multiple organ systems failure and requires high complexity decision making for assessment and support, frequent evaluation and titration of therapies, application of advanced monitoring technologies and extensive interpretation of multiple databases. Critical Care Time devoted to patient care services described in this note is 35 minutes.    Lafonda Patron V.  12/15/2012, 9:38 AM   Addendum- Clots suctioned out of oropharynx, weaning well on PS 5/5/, Spoke to Dr Annalee Genta, no packing since severely deviated septum, oozing may persist , if Hb drop (significant) may need re-embolisation - call ENT after extubation otherwise, will hold off another 24h  Hakop Humbarger V.

## 2012-12-15 NOTE — Progress Notes (Signed)
[facility] ADULT ICU REPLACEMENT PROTOCOL FOR AM LAB REPLACEMENT ONLY  The patient does not apply for the [facility] Adult ICU Electrolyte Replacment Protocol based on the criteria listed below:    Is GFR >/= 40 ml/min? no  Patient's GFR today is 40  Abnormal electrolyte(s): K3.4  6. If a panic level lab has been reported, has the CCM MD in charge been notified? yes.   Physician:  Elbert Ewings Deterding MD  Melrose Nakayama 12/15/2012 6:06 AM

## 2012-12-15 NOTE — Progress Notes (Signed)
Right sheath pulled by IR tech, vital signs remained stable during sheath pull. No signs of bleeding post sheath pull. Bed rest started at 11:30. Plan to wean off propofol then extubate per CCM request

## 2012-12-16 ENCOUNTER — Inpatient Hospital Stay (HOSPITAL_COMMUNITY): Payer: Medicare Other

## 2012-12-16 LAB — BLOOD GAS, ARTERIAL
Acid-Base Excess: 3.3 mmol/L — ABNORMAL HIGH (ref 0.0–2.0)
Drawn by: 34779
FIO2: 0.6 %
MECHVT: 550 mL
O2 Saturation: 100 %
Patient temperature: 97.5
RATE: 16 resp/min

## 2012-12-16 LAB — CBC
HCT: 26.2 % — ABNORMAL LOW (ref 39.0–52.0)
Hemoglobin: 8.3 g/dL — ABNORMAL LOW (ref 13.0–17.0)
MCH: 28.6 pg (ref 26.0–34.0)
MCHC: 31.7 g/dL (ref 30.0–36.0)

## 2012-12-16 LAB — POCT ACTIVATED CLOTTING TIME: Activated Clotting Time: 186 seconds

## 2012-12-16 LAB — BASIC METABOLIC PANEL
BUN: 38 mg/dL — ABNORMAL HIGH (ref 6–23)
CO2: 27 mEq/L (ref 19–32)
Chloride: 110 mEq/L (ref 96–112)
Glucose, Bld: 88 mg/dL (ref 70–99)
Potassium: 3.6 mEq/L (ref 3.5–5.1)

## 2012-12-16 LAB — GLUCOSE, CAPILLARY
Glucose-Capillary: 137 mg/dL — ABNORMAL HIGH (ref 70–99)
Glucose-Capillary: 81 mg/dL (ref 70–99)
Glucose-Capillary: 89 mg/dL (ref 70–99)

## 2012-12-16 MED ORDER — AMLODIPINE BESYLATE 5 MG PO TABS: 5.0000 mg | ORAL_TABLET | Freq: Every day | ORAL | Status: DC

## 2012-12-16 MED ORDER — METOPROLOL TARTRATE 50 MG PO TABS: 50.0000 mg | ORAL_TABLET | Freq: Two times a day (BID) | ORAL | Status: DC

## 2012-12-16 MED ORDER — VITAMIN D-3 125 MCG (5000 UT) PO TABS: 1.0000 | ORAL_TABLET | Freq: Every day | ORAL | Status: DC

## 2012-12-16 MED ORDER — FUROSEMIDE 40 MG PO TABS
60.0000 mg | ORAL_TABLET | Freq: Every morning | ORAL | Status: DC
  Filled 2012-12-16: qty 1

## 2012-12-16 MED ORDER — AMLODIPINE BESYLATE 2.5 MG PO TABS
2.5000 mg | ORAL_TABLET | Freq: Every day | ORAL | Status: DC
  Administered 2012-12-17 – 2012-12-20 (×4): 2.5 mg via ORAL
  Filled 2012-12-16 (×4): qty 1

## 2012-12-16 MED ORDER — FUROSEMIDE 10 MG/ML IJ SOLN
40.0000 mg | Freq: Two times a day (BID) | INTRAMUSCULAR | Status: DC
  Administered 2012-12-17: 40 mg via INTRAVENOUS
  Filled 2012-12-16 (×2): qty 4

## 2012-12-16 MED ORDER — HYDROXYCHLOROQUINE SULFATE 200 MG PO TABS
200.0000 mg | ORAL_TABLET | Freq: Two times a day (BID) | ORAL | Status: DC
  Administered 2012-12-16 – 2012-12-20 (×8): 200 mg via ORAL
  Filled 2012-12-16 (×9): qty 1

## 2012-12-16 MED ORDER — DOXAZOSIN MESYLATE 4 MG PO TABS
4.0000 mg | ORAL_TABLET | Freq: Every day | ORAL | Status: DC
  Administered 2012-12-16 – 2012-12-19 (×4): 4 mg via ORAL
  Filled 2012-12-16 (×5): qty 1

## 2012-12-16 MED ORDER — METOPROLOL TARTRATE 50 MG PO TABS
75.0000 mg | ORAL_TABLET | Freq: Two times a day (BID) | ORAL | Status: DC
  Administered 2012-12-16 – 2012-12-20 (×8): 75 mg via ORAL
  Filled 2012-12-16 (×9): qty 1

## 2012-12-16 MED ORDER — METOPROLOL TARTRATE 25 MG PO TABS
25.0000 mg | ORAL_TABLET | Freq: Once | ORAL | Status: AC
  Administered 2012-12-16: 25 mg via ORAL
  Filled 2012-12-16: qty 1

## 2012-12-16 MED ORDER — FERROUS SULFATE 325 (65 FE) MG PO TABS
325.0000 mg | ORAL_TABLET | Freq: Every day | ORAL | Status: DC
  Administered 2012-12-16 – 2012-12-20 (×5): 325 mg via ORAL
  Filled 2012-12-16 (×6): qty 1

## 2012-12-16 MED ORDER — FUROSEMIDE 40 MG PO TABS
40.0000 mg | ORAL_TABLET | Freq: Every day | ORAL | Status: DC
  Filled 2012-12-16: qty 1

## 2012-12-16 MED ORDER — METOPROLOL TARTRATE 50 MG PO TABS
50.0000 mg | ORAL_TABLET | Freq: Two times a day (BID) | ORAL | Status: DC
  Filled 2012-12-16: qty 1

## 2012-12-16 MED ORDER — CHOLECALCIFEROL 10 MCG (400 UNIT) PO TABS
400.0000 [IU] | ORAL_TABLET | Freq: Every day | ORAL | Status: DC
  Administered 2012-12-16 – 2012-12-20 (×5): 400 [IU] via ORAL
  Filled 2012-12-16 (×5): qty 1

## 2012-12-16 MED ORDER — WHITE PETROLATUM GEL
Status: AC
  Administered 2012-12-16: 17:00:00
  Filled 2012-12-16: qty 5

## 2012-12-16 MED ORDER — IRON 240 (27 FE) MG PO TABS: 1.0000 | ORAL_TABLET | Freq: Every day | ORAL | Status: DC

## 2012-12-16 NOTE — Progress Notes (Signed)
At 0908 12/16/12 the pt was extubated. Oral care was provided before and after the procedure. The pt was placed on 4L [place] and taught to cough and how to use the incentive spirometer. HR 99 O2 99% ART 150/66

## 2012-12-16 NOTE — Progress Notes (Signed)
3 Days Post-Op  Subjective: Rt I MAX artery and infraorbital arteries embolized with Dr Corliss Skains 7/25 No bleeding over 24 hrs Epistaxis resolved Still on vent/intubated Plan for extubation today  Objective: Vital signs in last 24 hours: Temp:  [98.2 F (36.8 C)-99.5 F (37.5 C)] 98.2 F (36.8 C) (07/28 0414) Pulse Rate:  [68-100] 91 (07/28 0748) Resp:  [12-24] 15 (07/28 0748) BP: (111-155)/(53-90) 132/59 mmHg (07/28 0748) SpO2:  [100 %] 100 % (07/28 0748) Arterial Line BP: (111-153)/(46-64) 134/55 mmHg (07/28 0600) FiO2 (%):  [40 %] 40 % (07/28 0748) Weight:  [239 lb 13.8 oz (108.8 kg)-246 lb 14.6 oz (112 kg)] 239 lb 13.8 oz (108.8 kg) (07/28 0500)    Intake/Output from previous day: 07/27 0701 - 07/28 0700 In: 434.7 [I.V.:434.7] Out: 2405 [Urine:2405] Intake/Output this shift:    PE:  Afeb; vss Hg stable On vent nasal dressing clean and dry No reported bleeding per RN Rt groin dressing- clean and dry- removed- placed band aid 2+ pulse rt foot  Lab Results:   Recent Labs  12/15/12 0429 12/16/12 0406  WBC 7.4 7.6  HGB 8.3* 8.3*  HCT 25.2* 26.2*  PLT 120* 114*   BMET  Recent Labs  12/15/12 0429 12/16/12 0406  NA 145 147*  K 3.4* 3.6  CL 109 110  CO2 28 27  GLUCOSE 90 88  BUN 40* 38*  CREATININE 2.12* 2.04*  CALCIUM 8.3* 8.6   PT/INR  Recent Labs  12/15/12 0429 12/16/12 0406  LABPROT 15.5* 15.4*  INR 1.26 1.25   ABG  Recent Labs  12/14/12 0440  PHART 7.523*  HCO3 26.1*    Studies/Results: Dg Chest Port 1 View  12/16/2012   *RADIOLOGY REPORT*  Clinical Data: Edema.  Evaluate endotracheal tube position.  PORTABLE CHEST - 1 VIEW  Comparison: 12/15/2012.  Findings: Endotracheal tube tip is 5 cm from the carina. Cardiomegaly.  CABG/median sternotomy.  Enteric tube is present with the tip not visualized.  Diffuse pulmonary edema is present which appears similar to the prior exam.  Retrocardiac density probably representing edema and  atelectasis.  IMPRESSION:  1.  Support apparatus in good position.  Endotracheal tube tip 5 cm from the carina. 2.  Persistent bilateral interstitial and mild alveolar pulmonary edema compatible with CHF.   Original Report Authenticated By: Andreas Newport, M.D.   Dg Chest Port 1 View  12/15/2012   *RADIOLOGY REPORT*  Clinical Data: Evaluate endotracheal tube position.  PORTABLE CHEST - 1 VIEW  Comparison: Chest x-ray 12/14/2012.  Findings: An endotracheal tube is in place with tip 4.3 cm above the carina. A nasogastric tube is seen extending into the stomach, however, the tip of the nasogastric tube extends below the lower margin of the image.  Status post median sternotomy for CABG including LIMA.  Lung volumes are low. There is cephalization of the pulmonary vasculature and slight indistinctness of the interstitial markings suggestive of mild pulmonary edema.  Small left pleural effusion.  Mild enlargement of cardiopericardial silhouette is unchanged. The patient is rotated to the left on today's exam, resulting in distortion of the mediastinal contours and reduced diagnostic sensitivity and specificity for mediastinal pathology.  Atherosclerosis in the thoracic aorta.  IMPRESSION: 1.  Support apparatus and postoperative changes, as above. 2.  Mild interstitial pulmonary edema. 3.  Mild enlargement of the cardiopericardial silhouette is unchanged. 4.  Small left pleural effusion. 5.  Atherosclerosis.   Original Report Authenticated By: Trudie Reed, M.D.    Anti-infectives: Anti-infectives  Start     Dose/Rate Route Frequency Ordered Stop   12/14/12 1000  hydroxychloroquine (PLAQUENIL) tablet 200 mg     200 mg Per Tube 2 times daily 12/14/12 0411     12/14/12 0000  ceFAZolin (ANCEF) IVPB 1 g/50 mL premix  Status:  Discontinued    Comments:  In IR   1 g 100 mL/hr over 30 Minutes Intravenous  Once 12/13/12 2356 12/13/12 2358   12/14/12 0000  ceFAZolin (ANCEF) IVPB 2 g/50 mL premix    Comments:   In IR   2 g 100 mL/hr over 30 Minutes Intravenous  Once 12/13/12 2358     12/13/12 2200  hydroxychloroquine (PLAQUENIL) tablet 200 mg  Status:  Discontinued     200 mg Oral 2 times daily 12/13/12 1940 12/14/12 0411      Assessment/Plan: s/p Procedure(s): RADIOLOGY WITH ANESTHESIA (N/A)   Epistaxis: Rt IMAX and infraorbital artery embolization 7/25 with dr Corliss Skains Still on vent Plan for extubation today No reported bleeding Will discuss with dr Corliss Skains   LOS: 3 days    Rise Traeger A 12/16/2012

## 2012-12-16 NOTE — Procedures (Signed)
Extubation Procedure Note  Patient Details:   Name: Alex Orozco DOB: 1930-05-10 MRN: [id number]   Airway Documentation:     Evaluation  O2 sats: stable throughout Complications: No apparent complications Patient did tolerate procedure well. Bilateral Breath Sounds: Clear;Diminished Suctioning: Airway Yes  Ave Filter 12/16/2012, 9:13 AM

## 2012-12-16 NOTE — Progress Notes (Signed)
UR Completed.  Alex Orozco Jane [phone] 12/16/2012  

## 2012-12-16 NOTE — Progress Notes (Signed)
Subjective: Denies CP  Breathing is OK. Objective: Filed Vitals:   12/16/12 0841 12/16/12 0900 12/16/12 0911 12/16/12 1000  BP:  143/62  135/71  Pulse:      Temp: 98 F (36.7 C)     TempSrc: Oral     Resp:      Height:      Weight:      SpO2:  100% 100% 99%   Weight change: 3 lb 8.4 oz (1.6 kg)  Intake/Output Summary (Last 24 hours) at 12/16/12 1048 Last data filed at 12/16/12 1000  Gross per 24 hour  Intake 423.63 ml  Output   2255 ml  Net -1831.37 ml    General: Alert, awake, oriented x3, in no acute distress HEENT  PUffy around eyes. Neck:  JVP is normal Heart: Regular rate and rhythm, without murmurs, rubs, gallops.  Lungs: Clear to auscultation.  No rales or wheezes. Exemities:  No edema.   Neuro: Grossly intact, nonfocal.  Tele:  Afib 80s to 100s. Lab Results: Results for orders placed during the hospital encounter of 12/13/12 (from the past 24 hour(s))  GLUCOSE, CAPILLARY     Status: None   Collection Time    12/15/12 11:33 AM      Result Value Range   Glucose-Capillary 91  70 - 99 mg/dL  GLUCOSE, CAPILLARY     Status: None   Collection Time    12/15/12  3:32 PM      Result Value Range   Glucose-Capillary 92  70 - 99 mg/dL   Comment 1 Notify RN    GLUCOSE, CAPILLARY     Status: None   Collection Time    12/15/12  7:44 PM      Result Value Range   Glucose-Capillary 81  70 - 99 mg/dL  GLUCOSE, CAPILLARY     Status: None   Collection Time    12/16/12 12:03 AM      Result Value Range   Glucose-Capillary 89  70 - 99 mg/dL  GLUCOSE, CAPILLARY     Status: None   Collection Time    12/16/12  3:43 AM      Result Value Range   Glucose-Capillary 86  70 - 99 mg/dL  PROTIME-INR     Status: Abnormal   Collection Time    12/16/12  4:06 AM      Result Value Range   Prothrombin Time 15.4 (*) 11.6 - 15.2 seconds   INR 1.25  0.00 - 1.49  BASIC METABOLIC PANEL     Status: Abnormal   Collection Time    12/16/12  4:06 AM      Result Value Range   Sodium 147  (*) 135 - 145 mEq/L   Potassium 3.6  3.5 - 5.1 mEq/L   Chloride 110  96 - 112 mEq/L   CO2 27  19 - 32 mEq/L   Glucose, Bld 88  70 - 99 mg/dL   BUN 38 (*) 6 - 23 mg/dL   Creatinine, Ser 1.61 (*) 0.50 - 1.35 mg/dL   Calcium 8.6  8.4 - 09.6 mg/dL   GFR calc non Af Amer 29 (*) >90 mL/min   GFR calc Af Amer 34 (*) >90 mL/min  CBC     Status: Abnormal   Collection Time    12/16/12  4:06 AM      Result Value Range   WBC 7.6  4.0 - 10.5 K/uL   RBC 2.90 (*) 4.22 - 5.81 MIL/uL   Hemoglobin 8.3 (*)  13.0 - 17.0 g/dL   HCT 66.4 (*) 40.3 - 47.4 %   MCV 90.3  78.0 - 100.0 fL   MCH 28.6  26.0 - 34.0 pg   MCHC 31.7  30.0 - 36.0 g/dL   RDW 25.9  56.3 - 87.5 %   Platelets 114 (*) 150 - 400 K/uL  GLUCOSE, CAPILLARY     Status: None   Collection Time    12/16/12  7:47 AM      Result Value Range   Glucose-Capillary 82  70 - 99 mg/dL    Studies/Results: @RISRSLT24 @  Medications: Reviewed   @PROBHOSP @  1.  CAD  S/p CABG.  S/P PTCA/DES in 2010  No symptoms to suggest active angina.  ANtilplt agents on hold.  Will not restart Plavix.  Start ASA as outpatient.    2.  Atrial fibrillation  Rates are a little high  Will increase metoprolol to 75 bid  Pull back on amlodipine to 2.5 Patient is off of coumadin  Will reassesss when to resume.    3.  Bleeding  S/p embolization.  NO actvie bleeding.    4.  HTN  Follow  Changes as noted above.  5  AS  Mild by echo in April 2013.  6  CHF  CXR with evid.  Probably diastolic with afib  Will switch lasix to IV today.  Strict *I/O Resume PO tomorrow.  LOS: 3 days   Dietrich Pates 12/16/2012, 10:48 AM

## 2012-12-16 NOTE — Progress Notes (Addendum)
PULMONARY  / CRITICAL CARE MEDICINE  Name: Alex Orozco MRN: [id number] DOB: 1929-07-04    ADMISSION DATE:  12/13/2012 CONSULTATION DATE:  12/14/12  REFERRING MD :  Dr. Julieanne Cotton PRIMARY SERVICE: PCCM  CHIEF COMPLAINT:  Epistaxis, post angiogram and embolization by IR. Intubated.  BRIEF PATIENT DESCRIPTION:  79 years old male with A. Fib,  CAD s/p CABG and stenting on Plavix, Aspirin and Coumadin. Presents with severe epistaxis. Unable to stop by ENt packing (shoemaker) Went to the IR suite for angiogram and embolization. Transferred intubated to the MICU. Critical care consult called to assist with management.  SIGNIFICANT EVENTS / STUDIES:  7/26 >>Post angiogram and embolization RT IMAX and RT infraorbital arteries for control of epistaxis. 7/27 >>> Clots suctioned out of oropharynx, weaning well on PS 5/5/, Spoke to Dr Annalee Genta, no packing since severely deviated septum, oozing may persist , if Hb drop (significant) may need re-embolisation - call ENT after extubation otherwise, will hold off another 24h  LINES / TUBES: - Peripheral lines  ANTIBIOTICS: Ancef 7/26 >>>7/27  SUBJECTIVE: extubated this AM succesfully. Hgb stable overnight  VITAL SIGNS: Temp:  [98 F (36.7 C)-99.5 F (37.5 C)] 98 F (36.7 C) (07/28 0841) Pulse Rate:  [68-100] 91 (07/28 0748) Resp:  [12-24] 15 (07/28 0748) BP: (111-142)/(53-91) 130/91 mmHg (07/28 0800) SpO2:  [100 %] 100 % (07/28 0911) Arterial Line BP: (111-144)/(46-67) 144/67 mmHg (07/28 0800) FiO2 (%):  [40 %] 40 % (07/28 0800) Weight:  [239 lb 13.8 oz (108.8 kg)-246 lb 14.6 oz (112 kg)] 239 lb 13.8 oz (108.8 kg) (07/28 0500) HEMODYNAMICS:   VENTILATOR SETTINGS: Vent Mode:  [-] CPAP;PSV FiO2 (%):  [40 %] 40 % Set Rate:  [12 bmp] 12 bmp Vt Set:  [500 mL] 500 mL PEEP:  [5 cmH20] 5 cmH20 Pressure Support:  [5 cmH20] 5 cmH20 Plateau Pressure:  [15 cmH20-17 cmH20] 17 cmH20 INTAKE / OUTPUT: Intake/Output     07/27 0701 - 07/28  0700 07/28 0701 - 07/29 0700   I.V. (mL/kg) 454.2 (4.2) 19.6 (0.2)   Blood     IV Piggyback     Total Intake(mL/kg) 454.2 (4.2) 19.6 (0.2)   Urine (mL/kg/hr) 2405 (0.9) 250 (0.9)   Drains     Total Output 2405 250   Net -1950.8 -230.4          PHYSICAL EXAMINATION: General: NAD HEENT: NCAT, small amount dried blood below R nostril, [place] in place Heart: tachycardic, irregularly irregular rhythm Lungs: CTAB, NWOB Abdomen: Abdomen soft,  not distended, normoactive bowel sounds. Skin: No rashes or lesions Neuro: speech and mentation intact  LABS:  Recent Labs Lab 12/14/12 0328 12/14/12 0440 12/14/12 1700 12/15/12 0429 12/16/12 0406  HGB 7.6*  --  8.4* 8.3* 8.3*  WBC 4.6  --  8.1 7.4 7.6  PLT 124*  --  117* 120* 114*  NA 143  --   --  145 147*  K 3.2*  --   --  3.4* 3.6  CL 107  --   --  109 110  CO2 27  --   --  28 27  GLUCOSE 87  --   --  90 88  BUN 46*  --   --  40* 38*  CREATININE 2.02*  --   --  2.12* 2.04*  CALCIUM 8.2*  --   --  8.3* 8.6  INR 1.83*  --   --  1.26 1.25  PHART  --  7.533*  --   --   --  PCO2ART  --  30.9*  --   --   --   PO2ART  --  209.0*  --   --   --     Recent Labs Lab 12/15/12 1532 12/15/12 1944 12/16/12 0003 12/16/12 0343 12/16/12 0747  GLUCAP 92 81 89 86 82    CXR: ETT in position, bilateral mild pulmonary edema  ASSESSMENT / PLAN:  PULMONARY A: Previously intubated for airway protection, now extubated P:   - Albuterol PRN -O2 prn to keep sats >92  CARDIOVASCULAR A:  Uncontrolled hypertension CAD post CABG and stenting on chronic plavix and aspirin AFib on coumadin. Received FFP. P:  - Will continue home antihypertensives, resume home dose of lopressor as no longer on propofol (previously bradycardic) - restart home lasix (d/c IV lasix) - Continue to hold coumadin, aspirin and plavix for now (cardiology input appreciated regarding when to restart these medications) - IV hydralazine PRN   RENAL A:   CKD  stable hypokalemia P:   - replete K prn  GASTROINTESTINAL A:   No acute issues P:   GI prophylaxis with protonix Give carb modified diet if tolerates extubation x 4 hours  HEMATOLOGIC A:   Acute blood loss Coagulopathy of coumadin -corrected wtih vit K & FFP P:  - aim for Hgb >8 given CAD  INFECTIOUS A:   No evidence of acute infection P:   - Prophylactic Ancef - d/c if no packing  ENDOCRINE A:   DM P:   - Novolog sliding scale  NEUROLOGIC A:   Hard of hearing P:   - PRN fentanyl for pain  TODAY'S SUMMARY: monitor in ICU overnight, restart all home meds, give diet today, possible d/c in AM  Levert Feinstein, MD Family Medicine PGY-2    I have interviewed and examined the patient and reviewed the database. I have formulated the assessment and plan as reflected in the note above with amendments made by me. 30 mins of direct critical care time provided. Extubated and looks good post ext. Timing of resumption of warfarin, ASA to be discussed with ENT. Per Cards, will not restart clopidogrel   Billy Fischer, MD;  PCCM service; Mobile [phone]

## 2012-12-17 ENCOUNTER — Encounter (HOSPITAL_COMMUNITY): Payer: Self-pay | Admitting: Interventional Radiology

## 2012-12-17 DIAGNOSIS — R040 Epistaxis: Principal | ICD-10-CM

## 2012-12-17 LAB — CBC
HCT: 25 % — ABNORMAL LOW (ref 39.0–52.0)
MCHC: 31.6 g/dL (ref 30.0–36.0)
Platelets: 116 10*3/uL — ABNORMAL LOW (ref 150–400)
RDW: 14.7 % (ref 11.5–15.5)
WBC: 7.3 10*3/uL (ref 4.0–10.5)

## 2012-12-17 LAB — GLUCOSE, CAPILLARY: Glucose-Capillary: 110 mg/dL — ABNORMAL HIGH (ref 70–99)

## 2012-12-17 LAB — BASIC METABOLIC PANEL
BUN: 37 mg/dL — ABNORMAL HIGH (ref 6–23)
Calcium: 8.5 mg/dL (ref 8.4–10.5)
Calcium: 8.7 mg/dL (ref 8.4–10.5)
Creatinine, Ser: 2.12 mg/dL — ABNORMAL HIGH (ref 0.50–1.35)
Creatinine, Ser: 2.17 mg/dL — ABNORMAL HIGH (ref 0.50–1.35)
GFR calc Af Amer: 32 mL/min — ABNORMAL LOW (ref >90)
GFR calc Af Amer: 31 mL/min — ABNORMAL LOW (ref >90)
GFR calc non Af Amer: 28 mL/min — ABNORMAL LOW (ref >90)
Glucose, Bld: 109 mg/dL — ABNORMAL HIGH (ref 70–99)
Potassium: 3.4 mEq/L — ABNORMAL LOW (ref 3.5–5.1)
Sodium: 148 mEq/L — ABNORMAL HIGH (ref 135–145)

## 2012-12-17 LAB — PROTIME-INR
INR: 1.32 (ref 0.00–1.49)
Prothrombin Time: 16.1 seconds — ABNORMAL HIGH (ref 11.6–15.2)

## 2012-12-17 MED ORDER — POTASSIUM CHLORIDE CRYS ER 20 MEQ PO TBCR: 20.0000 meq | EXTENDED_RELEASE_TABLET | Freq: Once | ORAL | Status: DC

## 2012-12-17 MED ORDER — PANTOPRAZOLE SODIUM 40 MG PO TBEC
40.0000 mg | DELAYED_RELEASE_TABLET | Freq: Every day | ORAL | Status: DC
  Administered 2012-12-17 – 2012-12-20 (×4): 40 mg via ORAL
  Filled 2012-12-17 (×4): qty 1

## 2012-12-17 MED ORDER — BOOST / RESOURCE BREEZE PO LIQD
1.0000 | Freq: Three times a day (TID) | ORAL | Status: DC
  Administered 2012-12-17 – 2012-12-20 (×7): 1 via ORAL
  Filled 2012-12-17: qty 1

## 2012-12-17 MED ORDER — POTASSIUM CHLORIDE CRYS ER 20 MEQ PO TBCR
40.0000 meq | EXTENDED_RELEASE_TABLET | Freq: Every day | ORAL | Status: DC
  Administered 2012-12-18 – 2012-12-20 (×3): 40 meq via ORAL
  Filled 2012-12-17 (×4): qty 2

## 2012-12-17 MED ORDER — POTASSIUM CHLORIDE CRYS ER 20 MEQ PO TBCR
40.0000 meq | EXTENDED_RELEASE_TABLET | Freq: Once | ORAL | Status: AC
  Administered 2012-12-17: 40 meq via ORAL
  Filled 2012-12-17: qty 2

## 2012-12-17 MED ORDER — ALBUTEROL SULFATE (5 MG/ML) 0.5% IN NEBU
2.5000 mg | INHALATION_SOLUTION | Freq: Four times a day (QID) | RESPIRATORY_TRACT | Status: DC | PRN
  Administered 2012-12-17 – 2012-12-18 (×2): 2.5 mg via RESPIRATORY_TRACT
  Filled 2012-12-17 (×2): qty 0.5

## 2012-12-17 MED ORDER — ACETAMINOPHEN 325 MG PO TABS
650.0000 mg | ORAL_TABLET | Freq: Four times a day (QID) | ORAL | Status: DC | PRN
  Administered 2012-12-17: 650 mg via ORAL
  Filled 2012-12-17: qty 2

## 2012-12-17 MED ORDER — ASPIRIN 81 MG PO CHEW
81.0000 mg | CHEWABLE_TABLET | Freq: Every day | ORAL | Status: DC
  Administered 2012-12-17 – 2012-12-19 (×3): 81 mg via ORAL
  Filled 2012-12-17 (×4): qty 1

## 2012-12-17 MED ORDER — FUROSEMIDE 40 MG PO TABS
60.0000 mg | ORAL_TABLET | Freq: Two times a day (BID) | ORAL | Status: DC
  Administered 2012-12-17 (×2): 60 mg via ORAL
  Filled 2012-12-17 (×5): qty 1

## 2012-12-17 MED ORDER — BIOTENE DRY MOUTH MT LIQD
15.0000 mL | Freq: Two times a day (BID) | OROMUCOSAL | Status: DC
  Administered 2012-12-17 – 2012-12-20 (×5): 15 mL via OROMUCOSAL

## 2012-12-17 NOTE — Progress Notes (Addendum)
PULMONARY  / CRITICAL CARE MEDICINE  Name: Alex Orozco MRN: [id number] DOB: [birth year]    ADMISSION DATE:  12/13/2012 CONSULTATION DATE:  12/14/12  REFERRING MD :  Dr. Julieanne Cotton PRIMARY SERVICE: PCCM  CHIEF COMPLAINT:  Epistaxis, post angiogram and embolization by IR. Intubated.  BRIEF PATIENT DESCRIPTION:  79 years old male with A. Fib,  CAD s/p CABG and stenting on Plavix, Aspirin and Coumadin. Presents with severe epistaxis. Unable to stop by ENt packing (shoemaker) Went to the IR suite for angiogram and embolization. Transferred intubated to the MICU. Critical care consult called to assist with management.  SIGNIFICANT EVENTS / STUDIES:  7/26 >>Post angiogram and embolization RT IMAX and RT infraorbital arteries for control of epistaxis. 7/27 >>> Clots suctioned out of oropharynx, weaning well on PS 5/5/, Spoke to Dr Annalee Genta, no packing since severely deviated septum, oozing may persist , if Hb drop (significant) may need re-embolisation - call ENT after extubation otherwise, will hold off another 24h  LINES / TUBES: - Peripheral lines  ANTIBIOTICS: Ancef 7/26 >>>7/27  SUBJECTIVE: no acute events overnight. Pt reports feeling weak this AM  VITAL SIGNS: Temp:  [99 F (37.2 C)-100.8 F (38.2 C)] 99 F (37.2 C) (07/29 0740) BP: (115-145)/(52-80) 145/57 mmHg (07/29 0800) SpO2:  [98 %-100 %] 100 % (07/29 0800) Arterial Line BP: (137-138)/(62) 137/62 mmHg (07/28 1100) HEMODYNAMICS:   VENTILATOR SETTINGS:   INTAKE / OUTPUT: Intake/Output     07/28 0701 - 07/29 0700 07/29 0701 - 07/30 0700   P.O. 1260 80   I.V. (mL/kg) 19.6 (0.2)    Total Intake(mL/kg) 1279.6 (11.8) 80 (0.7)   Urine (mL/kg/hr) 875 (0.3)    Total Output 875     Net +404.6 +80        Urine Occurrence 4 x      PHYSICAL EXAMINATION: General: NAD HEENT: NCAT, [place] in place, no active bleeding Heart: irregularly irregular rhythm, normal rate Lungs: CTAB, NWOB Abdomen: Abdomen soft,  not  distended, normoactive bowel sounds. Skin: No rashes noted Neuro: speech and mentation intact, hard of hearing Ext: warm and well perfused  LABS:  Recent Labs Lab 12/14/12 0440  12/15/12 0429 12/16/12 0406 12/16/12 1200 12/17/12 0100 12/17/12 0547  HGB  --   < > 8.3* 8.3*  --   --  7.9*  WBC  --   < > 7.4 7.6  --   --  7.3  PLT  --   < > 120* 114*  --   --  116*  NA  --   --  145 147*  --  146* 148*  K  --   --  3.4* 3.6  --  3.4* 3.3*  CL  --   --  109 110  --  109 110  CO2  --   --  28 27  --  29 29  GLUCOSE  --   --  90 88  --  109* 107*  BUN  --   --  40* 38*  --  37* 37*  CREATININE  --   --  2.12* 2.04*  --  2.12* 2.17*  CALCIUM  --   --  8.3* 8.6  --  8.5 8.7  INR  --   --  1.26 1.25  --   --  1.32  PROBNP  --   --   --   --  4332.0*  --   --   PHART 7.533*  --   --   --   --   --   --  PCO2ART 30.9*  --   --   --   --   --   --   PO2ART 209.0*  --   --   --   --   --   --   < > = values in this interval not displayed.  Recent Labs Lab 12/16/12 1553 12/16/12 1959 12/16/12 2349 12/17/12 0359 12/17/12 0717  GLUCAP 100* 137* 110* 107* 110*    CXR: no new film today  ASSESSMENT / PLAN:  PULMONARY A: Previously intubated for airway protection, now extubated P:   - Albuterol PRN -O2 prn to keep sats >92, wean as tolerated  CARDIOVASCULAR A:  Uncontrolled hypertension CAD post CABG and stenting on chronic plavix and aspirin AFib on coumadin. Received FFP. P:  - Will continue home antihypertensives, lasix - Continue to hold coumadin and aspirin. Per cardiology, will not continue plavix at discharge. - IV hydralazine PRN  - speak with ENT re: when to resume coumadin  RENAL A:   CKD stable Hypokalemia Hypernatremia P:   - replete K prn  GASTROINTESTINAL A:   No acute issues P:   GI prophylaxis with protonix Carb modified diet  HEMATOLOGIC A:   Acute blood loss Coagulopathy of coumadin -corrected wtih vit K & FFP P:  - aim for Hgb >8  given CAD, hold off on transfusion for now as Hgb 7.9 and no concerning signs of ischemia - CBC in AM - need to speak with ENT regarding when to restart coumadin  INFECTIOUS A:   No evidence of acute infection P:   - monitor  ENDOCRINE A:   DM P:   - Novolog sliding scale  NEUROLOGIC A:   Hard of hearing Weakness likely secondary to anemia P:   - PRN fentanyl for pain - PT consult  TODAY'S SUMMARY: transfer to telemetry, repeat CBC in AM, PT consult  Levert Feinstein, MD Family Medicine PGY-2   I have interviewed and examined the patient and reviewed the database. I have formulated the assessment and plan as reflected in the note above with amendments made by me. We can resume ASA now. Will discuss timing of resumption of warfarin with Dr Greta Doom, MD;  PCCM service; Mobile [phone]   ADD: Discussed with Dr Annalee Genta. We agree to resume ASA now and hold off on resumption of warfarin until after seen by Dr Annalee Genta in office in 2 wks. Pt should be instructed to call Dr Thurmon Fair office to schedule follow up in 2 wks. He will need to see Dr Elease Hashimoto in follow up around the same time. Anticipate DC to home 7/30  Billy Fischer, MD ; [facility] [phone].  After 5:30 PM or weekends, call [phone]

## 2012-12-17 NOTE — Progress Notes (Addendum)
NUTRITION FOLLOW UP  Intervention:   Resource Breeze po TID, each supplement provides 250 kcal and 9 grams of protein.  Nutrition Dx:   Inadequate oral intake now related to decreased appetite as evidenced by meal completion <50%.   Goal:   Enteral nutrition to provide 60-70% of estimated calorie needs (22-25 kcals/kg ideal body weight) and 100% of estimated protein needs, based on ASPEN guidelines for permissive underfeeding in critically ill obese individuals; NA  New Goal:  Pt to meet >/= 90% of their estimated nutrition needs  Monitor:   PO intake, weight trend, labs   Assessment:   79 years old male with A. Fib, CAD s/p CABG. Pt extubated 7/28.  Pt is HOH, per pt and family pt's appetite is decreased. Pt does not like ensure but loves juice and is willing to try [employer].   Height: Ht Readings from Last 1 Encounters:  12/13/12 5\' 8"  (1.727 m)    Weight Status:   Wt Readings from Last 1 Encounters:  12/16/12 239 lb 13.8 oz (108.8 kg)  Admission weight 238 lb 7/25  Re-estimated needs:  Kcal: 2100-2300 Protein: 100-110 grams Fluid: > 2.1 L/day  Skin: no issues noted  Diet Order: Cardiac   Intake/Output Summary (Last 24 hours) at 12/17/12 1020 Last data filed at 12/17/12 0800  Gross per 24 hour  Intake   1340 ml  Output    475 ml  Net    865 ml    Last BM: PTA   Labs:   Recent Labs Lab 12/16/12 0406 12/17/12 0100 12/17/12 0547  NA 147* 146* 148*  K 3.6 3.4* 3.3*  CL 110 109 110  CO2 27 29 29   BUN 38* 37* 37*  CREATININE 2.04* 2.12* 2.17*  CALCIUM 8.6 8.5 8.7  GLUCOSE 88 109* 107*    CBG (last 3)   Recent Labs  12/16/12 2349 12/17/12 0359 12/17/12 0717  GLUCAP 110* 107* 110*    Scheduled Meds: . amLODipine  2.5 mg Oral Daily  . antiseptic oral rinse  15 mL Mouth Rinse BID  .  ceFAZolin (ANCEF) IV  2 g Intravenous Once  . cholecalciferol  400 Units Oral Daily  . doxazosin  4 mg Oral QHS  . ferrous sulfate  325 mg Oral Q  breakfast  . furosemide  60 mg Oral BID  . haloperidol lactate  5 mg Intravenous Once  . hydroxychloroquine  200 mg Oral BID  . insulin aspart  0-15 Units Subcutaneous Q4H  . metoprolol tartrate  75 mg Oral BID  . pantoprazole  40 mg Oral Daily    Continuous Infusions:   Kendell Bane RD, LDN, CNSC [phone] Pager [phone] After Hours Pager

## 2012-12-17 NOTE — Progress Notes (Signed)
Spoke with pt & family regarding cpap tonight. They explained that pt has had terrible nose bleeds & procedures to fix it. They were all concerned about having the tight cpap mask on his nose with all the trouble he's had. Pt & family both decided they didn't want the cpap on him because they didn't want to risk the chance of him having to go through all that again.  Jacqulynn Cadet RRT

## 2012-12-17 NOTE — Progress Notes (Signed)
Subjective:  No further epistaxis. No chest pain or dyspnea this am. Rhythm atrial fib with rate 100  Objective:  Vital Signs in the last 24 hours: Temp:  [98 F (36.7 C)-100.8 F (38.2 C)] 99 F (37.2 C) (07/29 0740) Pulse Rate:  [91] 91 (07/28 0748) Resp:  [15] 15 (07/28 0748) BP: (115-145)/(52-91) 145/64 mmHg (07/29 0600) SpO2:  [98 %-100 %] 100 % (07/29 0600) Arterial Line BP: (137-144)/(61-67) 137/62 mmHg (07/28 1100) FiO2 (%):  [40 %] 40 % (07/28 0900)  Intake/Output from previous day: 07/28 0701 - 07/29 0700 In: 1279.6 [P.O.:1260; I.V.:19.6] Out: 875 [Urine:875] Intake/Output from this shift:    . amLODipine  2.5 mg Oral Daily  . antiseptic oral rinse  15 mL Mouth Rinse QID  .  ceFAZolin (ANCEF) IV  2 g Intravenous Once  . chlorhexidine  15 mL Mouth Rinse BID  . cholecalciferol  400 Units Oral Daily  . doxazosin  4 mg Oral QHS  . ferrous sulfate  325 mg Oral Q breakfast  . furosemide  40 mg Intravenous Q12H  . haloperidol lactate  5 mg Intravenous Once  . hydroxychloroquine  200 mg Oral BID  . insulin aspart  0-15 Units Subcutaneous Q4H  . metoprolol tartrate  75 mg Oral BID  . pantoprazole (PROTONIX) IV  40 mg Intravenous Daily   . propofol Stopped (12/16/12 0815)    Physical Exam: The patient appears to be in no distress.  Head and neck exam reveals that the pupils are equal and reactive.  The extraocular movements are full.  There is no scleral icterus.  Mouth and pharynx are benign.  No lymphadenopathy.  No carotid bruits.  The jugular venous pressure is normal.  Thyroid is not enlarged or tender.  Chest is clear to percussion and auscultation.  No rales or rhonchi.  Expansion of the chest is symmetrical.  Heart reveals no abnormal lift or heave.  First and second heart sounds are normal.  There is no murmur gallop rub or click. Rhythm irregular  The abdomen is soft and nontender.  Bowel sounds are normoactive.  There is no hepatosplenomegaly or mass.   There are no abdominal bruits.  Extremities reveal trace pedal edema.  Pedal pulses are good.  There is no cyanosis or clubbing.  Neurologic exam is normal strength and no lateralizing weakness.  No sensory deficits.  Integument reveals no rash  Lab Results:  Recent Labs  12/16/12 0406 12/17/12 0547  WBC 7.6 7.3  HGB 8.3* 7.9*  PLT 114* 116*    Recent Labs  12/17/12 0100 12/17/12 0547  NA 146* 148*  K 3.4* 3.3*  CL 109 110  CO2 29 29  GLUCOSE 109* 107*  BUN 37* 37*  CREATININE 2.12* 2.17*   No results found for this basename: TROPONINI, CK, MB,  in the last 72 hours Hepatic Function Panel No results found for this basename: PROT, ALBUMIN, AST, ALT, ALKPHOS, BILITOT, BILIDIR, IBILI,  in the last 72 hours No results found for this basename: CHOL,  in the last 72 hours No results found for this basename: PROTIME,  in the last 72 hours  Imaging: Dg Chest Port 1 View  12/16/2012   *RADIOLOGY REPORT*  Clinical Data: Edema.  Evaluate endotracheal tube position.  PORTABLE CHEST - 1 VIEW  Comparison: 12/15/2012.  Findings: Endotracheal tube tip is 5 cm from the carina. Cardiomegaly.  CABG/median sternotomy.  Enteric tube is present with the tip not visualized.  Diffuse pulmonary edema is  present which appears similar to the prior exam.  Retrocardiac density probably representing edema and atelectasis.  IMPRESSION:  1.  Support apparatus in good position.  Endotracheal tube tip 5 cm from the carina. 2.  Persistent bilateral interstitial and mild alveolar pulmonary edema compatible with CHF.   Original Report Authenticated By: Andreas Newport, M.D.    Cardiac Studies: Telemetry shows atrial fib with controlled VR Assessment/Plan:  1. CAD. No chest pain. Will not resume plavix. Restart ASA as outpatient. 2. ATrial fib. Rate control with metoprolol 75 mg BID.  Would like to restart warfarin when okay with Dr. Corliss Skains.  3. Diastolic CHF.  Switch lasix to po.  LOS: 4 days     Alex Orozco 12/17/2012, 7:45 AM

## 2012-12-18 ENCOUNTER — Inpatient Hospital Stay (HOSPITAL_COMMUNITY): Payer: Medicare Other

## 2012-12-18 DIAGNOSIS — R0602 Shortness of breath: Secondary | ICD-10-CM

## 2012-12-18 DIAGNOSIS — G4733 Obstructive sleep apnea (adult) (pediatric): Secondary | ICD-10-CM

## 2012-12-18 LAB — BASIC METABOLIC PANEL
BUN: 43 mg/dL — ABNORMAL HIGH (ref 6–23)
CO2: 25 mEq/L (ref 19–32)
Chloride: 106 mEq/L (ref 96–112)
GFR calc non Af Amer: 26 mL/min — ABNORMAL LOW (ref >90)
Glucose, Bld: 111 mg/dL — ABNORMAL HIGH (ref 70–99)
Potassium: 3.6 mEq/L (ref 3.5–5.1)
Sodium: 143 mEq/L (ref 135–145)

## 2012-12-18 LAB — CBC
HCT: 24.8 % — ABNORMAL LOW (ref 39.0–52.0)
Hemoglobin: 8.1 g/dL — ABNORMAL LOW (ref 13.0–17.0)
MCH: 29.6 pg (ref 26.0–34.0)
MCHC: 32.7 g/dL (ref 30.0–36.0)
RBC: 2.74 MIL/uL — ABNORMAL LOW (ref 4.22–5.81)

## 2012-12-18 MED ORDER — FUROSEMIDE 10 MG/ML IJ SOLN
80.0000 mg | Freq: Once | INTRAMUSCULAR | Status: AC
  Administered 2012-12-18: 80 mg via INTRAVENOUS

## 2012-12-18 MED ORDER — LEVALBUTEROL HCL 0.63 MG/3ML IN NEBU
0.6300 mg | INHALATION_SOLUTION | Freq: Four times a day (QID) | RESPIRATORY_TRACT | Status: DC
  Administered 2012-12-18 – 2012-12-20 (×8): 0.63 mg via RESPIRATORY_TRACT
  Filled 2012-12-18 (×15): qty 3

## 2012-12-18 MED ORDER — FUROSEMIDE 40 MG PO TABS
60.0000 mg | ORAL_TABLET | Freq: Two times a day (BID) | ORAL | Status: DC
  Administered 2012-12-18 – 2012-12-20 (×4): 60 mg via ORAL
  Filled 2012-12-18 (×6): qty 1

## 2012-12-18 NOTE — Evaluation (Signed)
Physical Therapy Evaluation Patient Details Name: Alex Orozco MRN: [id number] DOB: 06-Mar-1930 Today's Date: 12/18/2012 Time: 1450-1510 PT Time Calculation (min): 20 min  PT Assessment / Plan / Recommendation History of Present Illness    79 years old male with A. Fib, CAD s/p CABG and stenting on Plavix, Aspirin and Coumadin. Presents with severe epistaxis. Unable to stop by ENT packing (shoemaker) Went to the IR suite for angiogram and embolization. Transferred intubated to the MICU. Critical care consult called to assist with management.   Clinical Impression  Pt demonstrates significant deficits in functional mobility at this time. Feel patient will need continued skilled PT to address deficits and maximize function prior to dc home. Patient was independent prior to arrival. Will continue to see patient acutely and rec ST SNF upon discharge to ensure safety and mobility at discharge as patient lives with wife who is limited in the amount of assist she can provide due to her own health and abilities. Will see as indicated and progress activity as tolerated.    PT Assessment  Patient needs continued PT services    Follow Up Recommendations  SNF    Does the patient have the potential to tolerate intense rehabilitation      Barriers to Discharge        Equipment Recommendations  Other (comment) (TBD)    Recommendations for Other Services OT consult (JY:[facility]NF UE/Hand)   Frequency Min 3X/week    Precautions / Restrictions Precautions Precautions: Fall Restrictions Weight Bearing Restrictions: No   Pertinent Vitals/Pain Reports no pain at this time, SpO2 >93% on rm air, Doe 3/4      Mobility  Bed Mobility Bed Mobility: Not assessed Transfers Transfers: Sit to Stand;Stand to Sit Sit to Stand: 1: +2 Total assist Sit to Stand: Patient Percentage: 40% Stand to Sit: 1: +2 Total assist Stand to Sit: Patient Percentage: 60% Details for Transfer Assistance: VCs for various  techniques, unsuccessful, patient required +2 assist with gait belt to elevate to standing, patient could not maintain standing at this time without increased assist. Ambulation/Gait Ambulation/Gait Assistance: Not tested (comment)        PT Diagnosis: Difficulty walking;Generalized weakness  PT Problem List: Decreased strength;Decreased activity tolerance;Decreased balance;Decreased mobility;Cardiopulmonary status limiting activity PT Treatment Interventions: DME instruction;Gait training;Stair training;Functional mobility training;Therapeutic activities;Therapeutic exercise;Balance training;Patient/family education     PT Goals(Current goals can be found in the care plan section) Acute Rehab PT Goals Patient Stated Goal: to go home PT Goal Formulation: With patient/family Time For Goal Achievement: 01/01/13 Potential to Achieve Goals: Good  Visit Information  Last PT Received On: 12/18/12 Assistance Needed: +2       Prior Functioning  Home Living Family/patient expects to be discharged to:: Private residence Living Arrangements: Spouse/significant other Available Help at Discharge: Family Type of Home: House Home Access: Stairs to enter Secretary/administrator of Steps: 3 Home Layout: One level Prior Function Level of Independence: Independent Communication Communication: HOH Dominant Hand: Right    Cognition  Cognition Arousal/Alertness: Awake/alert Behavior During Therapy: WFL for tasks assessed/performed Overall Cognitive Status: Within Functional Limits for tasks assessed    Extremity/Trunk Assessment Upper Extremity Assessment Upper Extremity Assessment: Defer to OT evaluation Lower Extremity Assessment Lower Extremity Assessment: Generalized weakness   Balance Balance Balance Assessed: Yes Static Standing Balance Static Standing - Balance Support: No upper extremity supported Static Standing - Level of Assistance: 1: +2 Total assist Static Standing -  Comment/# of Minutes: patient required increased assist bilaterally and posterior support  to maintain static standing, 3 minutes, patient with increased DOE and fatigue  End of Session PT - End of Session Equipment Utilized During Treatment: Gait belt Activity Tolerance: Patient limited by fatigue Patient left: in chair;with call bell/phone within reach;with family/visitor present Nurse Communication: Mobility status  GP     Fabio Asa 12/18/2012, 3:36 PM Charlotte Crumb, PT DPT  [phone]

## 2012-12-18 NOTE — Progress Notes (Signed)
PULMONARY  / CRITICAL CARE MEDICINE  Name: Alex Orozco MRN: [id number] DOB: Apr 17, 1930    ADMISSION DATE:  12/13/2012 CONSULTATION DATE:  12/14/12  REFERRING MD :  Dr. Julieanne Cotton PRIMARY SERVICE: PCCM  CHIEF COMPLAINT:  Epistaxis, post angiogram and embolization by IR. Intubated.  BRIEF PATIENT DESCRIPTION:  79 years old male with A. Fib,  CAD s/p CABG and stenting on Plavix, Aspirin and Coumadin. Presents with severe epistaxis. Unable to stop by ENT packing (shoemaker) Went to the IR suite for angiogram and embolization. Transferred intubated to the MICU. Critical care consult called to assist with management.  SIGNIFICANT EVENTS / STUDIES:  7/26 >>Post angiogram and embolization RT IMAX and RT infraorbital arteries for control of epistaxis. 7/27 >>> Clots suctioned out of oropharynx, weaning well on PS 5/5/, Spoke to Dr Annalee Genta, no packing since severely deviated septum, oozing may persist , if Hb drop (significant) may need re-embolisation - call ENT after extubation otherwise, will hold off another 24h  LINES / TUBES: - Peripheral lines  ANTIBIOTICS: Ancef 7/26 >>>7/27  SUBJECTIVE:  No further epistaxis.  Feels sob.   VITAL SIGNS: Temp:  [98 F (36.7 C)-99.9 F (37.7 C)] 99.1 F (37.3 C) (07/30 0526) Pulse Rate:  [78-89] 78 (07/30 0526) Resp:  [18-24] 21 (07/30 0526) BP: (115-139)/(64-85) 129/74 mmHg (07/30 0526) SpO2:  [93 %-98 %] 95 % (07/30 0849) Room air  INTAKE / OUTPUT: Intake/Output     07/29 0701 - 07/30 0700 07/30 0701 - 07/31 0700   P.O. 560    I.V. (mL/kg)     Total Intake(mL/kg) 560 (5.1)    Urine (mL/kg/hr) 300 (0.1)    Total Output 300     Net +260          Urine Occurrence 1 x      PHYSICAL EXAMINATION: General: NAD HEENT: NCAT, [place] in place, no further bleeding, clot in R nare Heart: irregularly irregular rhythm, normal rate Lungs: resps even, mildly labored, diminished throughout, mild exp wheeze, few scattered rhonchi R>L Abdomen:  Abdomen soft,  not distended, normoactive bowel sounds. Skin: No rashes noted Neuro: speech and mentation intact, hard of hearing Ext: warm and well perfused  LABS: CBC Recent Labs     12/16/12  0406  12/17/12  0547  12/18/12  0458  WBC  7.6  7.3  7.4  HGB  8.3*  7.9*  8.1*  HCT  26.2*  25.0*  24.8*  PLT  114*  116*  135*    Coag's Recent Labs     12/16/12  0406  12/17/12  0547  INR  1.25  1.32    BMET Recent Labs     12/17/12  0100  12/17/12  0547  12/18/12  0458  NA  146*  148*  143  K  3.4*  3.3*  3.6  CL  109  110  106  CO2  29  29  25   BUN  37*  37*  43*  CREATININE  2.12*  2.17*  2.22*  GLUCOSE  109*  107*  111*    Electrolytes Recent Labs     12/17/12  0100  12/17/12  0547  12/18/12  0458  CALCIUM  8.5  8.7  8.5   Cardiac Enzymes Recent Labs     12/16/12  1200  PROBNP  4332.0*    Glucose Recent Labs     12/16/12  1553  12/16/12  1959  12/16/12  2349  12/17/12  0359  12/17/12  0717  12/17/12  1108  GLUCAP  100*  137*  110*  107*  110*  120*    Imaging Dg Chest 2 View  12/18/2012   *RADIOLOGY REPORT*  Clinical Data: chf, wheezing  CHEST - 2 VIEW  Comparison: 12/16/12  Findings: Moderate cardiac enlargement.  Status post CABG.  Mild to moderate central vascular congestion.  Mild peribronchial cuffings. Mild lingular interstitial change.  Mild right lower lobe posterior infiltrate.  IMPRESSION: Vascular congestion with mild lingular interstitial and right lower lobe hazy opacities, possibly representing early developing pulmonary edema.  Particularly regarding the right lower lobe, pneumonia not excluded.   Original Report Authenticated By: Esperanza Heir, M.D.    ASSESSMENT / PLAN:  PULMONARY A: Previously intubated for airway protection, now extubated. Respiratory distress 7/30 >> likely from hypervolemia and pulmonary edema P:   -change to scheduled xopenex -goal negative fluid balance -O2 to keep SpO2 > 92% as needed -f/u CXR  7/31  CARDIOVASCULAR A:  Uncontrolled HTN on admission >> improved. CAD post CABG and stenting on chronic plavix and aspirin AFib on coumadin. P:  -Will continue home antihypertensives -Additional IV lasix x 1 7/30 per cards  -Continue to hold coumadin and aspirin. Per cardiology, will not continue plavix at discharge. -IV hydralazine PRN  -Pt to f/u with ENT in 2 weeks after hospital d/c, and then with cards to determine when he can resume coumadin  RENAL A:   CKD stable Hypokalemia Hypernatremia P:   -monitor renal fx, urine outpt -f/u and replace electrolytes as needed  GASTROINTESTINAL A:   Nutrition. P:   GI prophylaxis with protonix Carb modified diet  HEMATOLOGIC A:   Acute blood loss 2nd to epistaxis Coagulopathy of coumadin -corrected wtih vit K & FFP P:  -f/u CBC -transfuse for Hb < 7 or bleeding  INFECTIOUS A:   No evidence of acute infection P:   - monitor  ENDOCRINE A:   DM P:   - Novolog sliding scale  NEUROLOGIC A:   Hard of hearing Deconditioning P:   - PRN fentanyl for pain - PT consult  WHITEHEART,KATHRYN, NP 12/18/2012  9:10 AM Pager: [phone] or [phone]  Reviewed above, examined pt, and agree with assessment/plan.  Has increased WOB.  Will monitor in hospital today while getting diuresis.  If stable, then consider d/c home on 7/31.  Updated family at bedside about plan.  Coralyn Helling, MD [facility] Pulmonary/Critical Care 12/18/2012, 10:23 AM Pager:  [phone] After 3pm call: [phone]

## 2012-12-18 NOTE — Progress Notes (Signed)
PT Cancellation Note  Patient Details Name: Alex Orozco MRN: [id number] DOB: 1929-09-11   Cancelled Treatment:    Reason Eval/Treat Not Completed: Medical issues which prohibited therapy (Pt with respiratory difficulties this am per nursing)  Will Hold PT evaluation at this time.   Fabio Asa 12/18/2012, 8:58 AM

## 2012-12-18 NOTE — Progress Notes (Signed)
   Subjective:  No further epistaxis. Very short of breath this am with expiratory wheezing.  Objective:  Vital Signs in the last 24 hours: Temp:  [98 F (36.7 C)-99.9 F (37.7 C)] 99.1 F (37.3 C) (07/30 0526) Pulse Rate:  [78-89] 78 (07/30 0526) Resp:  [18-24] 21 (07/30 0526) BP: (115-145)/(57-85) 129/74 mmHg (07/30 0526) SpO2:  [93 %-100 %] 93 % (07/30 0526)  Intake/Output from previous day: 07/29 0701 - 07/30 0700 In: 560 [P.O.:560] Out: 300 [Urine:300] Intake/Output from this shift:    . amLODipine  2.5 mg Oral Daily  . antiseptic oral rinse  15 mL Mouth Rinse BID  . aspirin  81 mg Oral Daily  . cholecalciferol  400 Units Oral Daily  . doxazosin  4 mg Oral QHS  . feeding supplement  1 Container Oral TID BM  . ferrous sulfate  325 mg Oral Q breakfast  . furosemide  80 mg Intravenous Once  . furosemide  60 mg Oral BID  . hydroxychloroquine  200 mg Oral BID  . metoprolol tartrate  75 mg Oral BID  . pantoprazole  40 mg Oral Daily  . potassium chloride  40 mEq Oral Daily      Physical Exam: The patient appears to be in no distress.  Head and neck exam reveals that the pupils are equal and reactive.  The extraocular movements are full.  There is no scleral icterus.  Mouth and pharynx are benign.  No lymphadenopathy.  No carotid bruits.  The jugular venous pressure is normal.  Thyroid is not enlarged or tender.  Chest severe bilateral expiratory wheezing.  Heart reveals no abnormal lift or heave.  First and second heart sounds are normal.  There is no murmur gallop rub or click. Rhythm irregular  The abdomen is soft and nontender.  Bowel sounds are normoactive.  There is no hepatosplenomegaly or mass.  There are no abdominal bruits.  Extremities reveal trace pedal edema.  Pedal pulses are good.  There is no cyanosis or clubbing.  Neurologic exam is normal strength and no lateralizing weakness.  No sensory deficits.  Integument reveals no rash  Lab  Results:  Recent Labs  12/17/12 0547 12/18/12 0458  WBC 7.3 7.4  HGB 7.9* 8.1*  PLT 116* 135*    Recent Labs  12/17/12 0547 12/18/12 0458  NA 148* 143  K 3.3* 3.6  CL 110 106  CO2 29 25  GLUCOSE 107* 111*  BUN 37* 43*  CREATININE 2.17* 2.22*   No results found for this basename: TROPONINI, CK, MB,  in the last 72 hours Hepatic Function Panel No results found for this basename: PROT, ALBUMIN, AST, ALT, ALKPHOS, BILITOT, BILIDIR, IBILI,  in the last 72 hours No results found for this basename: CHOL,  in the last 72 hours No results found for this basename: PROTIME,  in the last 72 hours  Imaging: No results found.  Cardiac Studies: Telemetry shows atrial fib with controlled VR Assessment/Plan:  1. CAD. No chest pain. Will not resume plavix.Now back on ASA 2. ATrial fib. Rate control with metoprolol 75 mg BID. Plan restart warfarin in several weeks after seen by ENT and by Dr. Elease Hashimoto. 3. Diastolic CHF.  Will give one dose Lasix 80 mg IV now then resume po. Check chest xray today. 4. Deconditioning. Has not walked. Will ask PT to see.   LOS: 5 days    Alex Orozco 12/18/2012, 7:54 AM

## 2012-12-19 ENCOUNTER — Inpatient Hospital Stay (HOSPITAL_COMMUNITY): Payer: Medicare Other

## 2012-12-19 LAB — CBC
MCHC: 32.1 g/dL (ref 30.0–36.0)
MCV: 89.9 fL (ref 78.0–100.0)
Platelets: 152 10*3/uL (ref 150–400)
RDW: 15 % (ref 11.5–15.5)
WBC: 7.1 10*3/uL (ref 4.0–10.5)

## 2012-12-19 LAB — BASIC METABOLIC PANEL
BUN: 45 mg/dL — ABNORMAL HIGH (ref 6–23)
CO2: 25 mEq/L (ref 19–32)
Calcium: 8.5 mg/dL (ref 8.4–10.5)
Creatinine, Ser: 2.28 mg/dL — ABNORMAL HIGH (ref 0.50–1.35)

## 2012-12-19 NOTE — Progress Notes (Signed)
Subjective:  No further epistaxis.  His breathing is much better this morning.  He is no longer wheezing.  Chest x-ray shows improvement in CHF. Objective:  Vital Signs in the last 24 hours: Temp:  [98.2 F (36.8 C)-99.4 F (37.4 C)] 98.7 F (37.1 C) (07/31 0544) Pulse Rate:  [83-87] 84 (07/31 0544) Resp:  [18-22] 22 (07/31 0544) BP: (120-132)/(58-63) 132/63 mmHg (07/31 0544) SpO2:  [95 %-98 %] 97 % (07/31 0544)  Intake/Output from previous day: 07/30 0701 - 07/31 0700 In: 480 [P.O.:480] Out: 875 [Urine:875] Intake/Output from this shift:    . amLODipine  2.5 mg Oral Daily  . antiseptic oral rinse  15 mL Mouth Rinse BID  . aspirin  81 mg Oral Daily  . cholecalciferol  400 Units Oral Daily  . doxazosin  4 mg Oral QHS  . feeding supplement  1 Container Oral TID BM  . ferrous sulfate  325 mg Oral Q breakfast  . furosemide  60 mg Oral BID  . hydroxychloroquine  200 mg Oral BID  . levalbuterol  0.63 mg Nebulization Q6H  . metoprolol tartrate  75 mg Oral BID  . pantoprazole  40 mg Oral Daily  . potassium chloride  40 mEq Oral Daily      Physical Exam: The patient appears to be in no distress.  Head and neck exam reveals that the pupils are equal and reactive.  The extraocular movements are full.  There is no scleral icterus.  Mouth and pharynx are benign.  No lymphadenopathy.  No carotid bruits.  The jugular venous pressure is normal.  Thyroid is not enlarged or tender.  Chest reveals no wheezing.  Good breath sounds bilaterally  Heart reveals no abnormal lift or heave.  First and second heart sounds are normal.  There is no murmur gallop rub or click. Rhythm irregular  The abdomen is soft and nontender.  Bowel sounds are normoactive.  There is no hepatosplenomegaly or mass.  There are no abdominal bruits.  Extremities reveal trace pedal edema.  Pedal pulses are good.  There is no cyanosis or clubbing.  Neurologic exam is normal strength and no lateralizing weakness.   No sensory deficits.  Integument reveals no rash  Lab Results:  Recent Labs  12/18/12 0458 12/19/12 0623  WBC 7.4 7.1  HGB 8.1* 7.7*  PLT 135* 152    Recent Labs  12/18/12 0458 12/19/12 0623  NA 143 143  K 3.6 3.3*  CL 106 107  CO2 25 25  GLUCOSE 111* 119*  BUN 43* 45*  CREATININE 2.22* 2.28*   No results found for this basename: TROPONINI, CK, MB,  in the last 72 hours Hepatic Function Panel No results found for this basename: PROT, ALBUMIN, AST, ALT, ALKPHOS, BILITOT, BILIDIR, IBILI,  in the last 72 hours No results found for this basename: CHOL,  in the last 72 hours No results found for this basename: PROTIME,  in the last 72 hours  Imaging: Dg Chest 2 View  12/19/2012   *RADIOLOGY REPORT*  Clinical Data: Cough, CHF.  CHEST - 2 VIEW  Comparison: 12/18/2012  Findings: Prior CABG.  Cardiomegaly.  No overt edema.  No focal opacities or effusions.  No acute bony abnormality.  Degenerative changes in the shoulders bilaterally.  IMPRESSION: Cardiomegaly with vascular congestion.   Original Report Authenticated By: Charlett Nose, M.D.   Dg Chest 2 View  12/18/2012   *RADIOLOGY REPORT*  Clinical Data: chf, wheezing  CHEST - 2 VIEW  Comparison:  12/16/12  Findings: Moderate cardiac enlargement.  Status post CABG.  Mild to moderate central vascular congestion.  Mild peribronchial cuffings. Mild lingular interstitial change.  Mild right lower lobe posterior infiltrate.  IMPRESSION: Vascular congestion with mild lingular interstitial and right lower lobe hazy opacities, possibly representing early developing pulmonary edema.  Particularly regarding the right lower lobe, pneumonia not excluded.   Original Report Authenticated By: Esperanza Heir, M.D.    Cardiac Studies: Telemetry shows atrial fib with controlled VR Assessment/Plan:  1. CAD. No chest pain. Will not resume plavix.Now back on ASA 2. Atrial fib. Rate control with metoprolol 75 mg BID. Plan restart warfarin in several  weeks after seen by ENT and by Dr. Elease Hashimoto. 3. Diastolic CHF.   Continue Lasix 60 mg twice a day. 4. Deconditioning.  Physical therapy suggests short-term skilled nursing facility.  Wife agrees. 5. blood loss anemia.  Hemoglobin down to 7.7.  Consider packed cells.  LOS: 6 days    Cassell Clement 12/19/2012, 8:31 AM

## 2012-12-19 NOTE — Progress Notes (Signed)
Physical Therapy Treatment Patient Details Name: Alex Orozco MRN: [id number] DOB: [birth year] Today's Date: 12/19/2012 Time: 4098-1191 PT Time Calculation (min): 19 min  PT Assessment / Plan / Recommendation  History of Present Illness 79 years old male with A. Fib, CAD s/p CABG and stenting on Plavix, Aspirin and Coumadin. Presents with severe epistaxis. Unable to stop by ENT packing (shoemaker) Went to the IR suite for angiogram and embolization. Transferred intubated to the MICU. Critical care consult called to assist with management   PT Comments   Pt demonstrates steady progress towards PT goals with improvements over evaluation. Patient able to ambulate today with RW. Pt still requires increased assist for transfers and mobility. Continue to rec ST SNF upon discharge. Will continue to see as indicated.  Follow Up Recommendations  SNF     Does the patient have the potential to tolerate intense rehabilitation     Barriers to Discharge        Equipment Recommendations  Other (comment) (TBD)    Recommendations for Other Services OT consult (YN:WGNFA UE/Hand)  Frequency Min 3X/week   Progress towards PT Goals Progress towards PT goals: Progressing toward goals  Plan Current plan remains appropriate    Precautions / Restrictions Precautions Precautions: Fall Restrictions Weight Bearing Restrictions: No   Pertinent Vitals/Pain No pain at this time    Mobility  Bed Mobility Bed Mobility: Not assessed Transfers Transfers: Sit to Stand;Stand to Sit Sit to Stand: 1: +2 Total assist Sit to Stand: Patient Percentage: 70% Stand to Sit: 1: +2 Total assist Stand to Sit: Patient Percentage: 80% Details for Transfer Assistance: VCs for hand placement, +2 for assist and stability upon standing (much improved over yesterday) Ambulation/Gait Ambulation/Gait Assistance: 4: Min assist Ambulation Distance (Feet): 18 Feet Assistive device: Rolling walker Ambulation/Gait Assistance  Details: assist for stability and control of RW, VCs for positioning and upright posture Gait Pattern: Step-through pattern;Decreased stride length;Trunk flexed;Wide base of support Gait velocity: decreased General Gait Details: some instability and safety defictis noted    Exercises     PT Diagnosis:    PT Problem List:   PT Treatment Interventions:     PT Goals (current goals can now be found in the care plan section) Acute Rehab PT Goals Patient Stated Goal: to go home PT Goal Formulation: With patient/family Time For Goal Achievement: 01/01/13 Potential to Achieve Goals: Good  Visit Information  Last PT Received On: 12/19/12 Assistance Needed: +2 History of Present Illness: 79 years old male with A. Fib, CAD s/p CABG and stenting on Plavix, Aspirin and Coumadin. Presents with severe epistaxis. Unable to stop by ENT packing (shoemaker) Went to the IR suite for angiogram and embolization. Transferred intubated to the MICU. Critical care consult called to assist with management    Subjective Data  Subjective: I feel a little better today Patient Stated Goal: to go home   Cognition  Cognition Arousal/Alertness: Awake/alert Behavior During Therapy: WFL for tasks assessed/performed Overall Cognitive Status: Within Functional Limits for tasks assessed    Balance  Balance Balance Assessed: Yes Static Standing Balance Static Standing - Balance Support: No upper extremity supported (occassional propping on elbows at counter) Static Standing - Level of Assistance: 5: Stand by assistance Static Standing - Comment/# of Minutes: 3 minutes  End of Session PT - End of Session Equipment Utilized During Treatment: Gait belt Activity Tolerance: Patient limited by fatigue Patient left: in chair;with call bell/phone within reach;with family/visitor present Nurse Communication: Mobility status   GP  Fabio Asa 12/19/2012, 4:13 PM Charlotte Crumb, PT DPT  [phone]

## 2012-12-19 NOTE — Progress Notes (Signed)
CSW confirmed bed at [facility] at [facility]. CSW awaiting d/c order and summary, possibly tomorrow. FL2 on chart in wall for MD signature.   Maree Krabbe, MSW, Theresia Majors [phone]

## 2012-12-19 NOTE — Progress Notes (Signed)
Occupational Therapy Evaluation Patient Details Name: Alex Orozco MRN: [id number] DOB: 04/21/1930 Today's Date: 12/19/2012 Time: 4098-1191 OT Time Calculation (min): 26 min  OT Assessment / Plan / Recommendation History of present illness 79 years old male with A. Fib, CAD s/p CABG and stenting on Plavix, Aspirin and Coumadin. Presents with severe epistaxis. Unable to stop by ENT packing (shoemaker) Went to the IR suite for angiogram and embolization. Transferred intubated to the MICU. Critical care consult called to assist with management   Clinical Impression   PTA, pt independent with all ADL and mobility. Pt with significant functional decline. Nsg/PT concerned regarding B hands. Pt with overall deconditioning and will benefit from rehab at SNF. Pt given theraputty and exercises to improve B hand strength. Family educated. All further OT to be completed at [facility].    OT Assessment  All further OT needs can be met in the next venue of care    Follow Up Recommendations  SNF    Barriers to Discharge      Equipment Recommendations  None recommended by OT    Recommendations for Other Services    Frequency       Precautions / Restrictions Precautions Precautions: Fall Restrictions Weight Bearing Restrictions: No   Pertinent Vitals/Pain Dyspnea 2/4. O2 94-97 with activity    ADL  Eating/Feeding: Set up Grooming: Set up Upper Body Bathing: Minimal assistance Where Assessed - Upper Body Bathing: Unsupported sitting Lower Body Bathing: Moderate assistance Where Assessed - Lower Body Bathing: Supported sit to stand Upper Body Dressing: Minimal assistance Where Assessed - Upper Body Dressing: Unsupported sitting Lower Body Dressing: Moderate assistance Where Assessed - Lower Body Dressing: Supported sit to Pharmacist, hospital: Moderate assistance Toilet Transfer Method: Sit to stand Equipment Used: Gait belt;Rolling walker Transfers/Ambulation Related to ADLs: +2 for sit -  stand Pt @ 70%. ADL Comments: Greater assistance needed for sit - stand.     OT Diagnosis: Generalized weakness;Acute pain  OT Problem List: Decreased strength;Decreased range of motion;Decreased activity tolerance;Impaired balance (sitting and/or standing);Decreased coordination;Decreased knowledge of use of DME or AE;Decreased knowledge of precautions;Cardiopulmonary status limiting activity;Obesity;Impaired UE functional use;Increased edema OT Treatment Interventions:     OT Goals(Current goals can be found in the care plan section) Acute Rehab OT Goals Patient Stated Goal: to go home OT Goal Formulation:  (eval only)  Visit Information  Last OT Received On: 12/19/12 Assistance Needed: +2 PT/OT Co-Evaluation/Treatment: Yes History of Present Illness: 79 years old male with A. Fib, CAD s/p CABG and stenting on Plavix, Aspirin and Coumadin. Presents with severe epistaxis. Unable to stop by ENT packing (shoemaker) Went to the IR suite for angiogram and embolization. Transferred intubated to the MICU. Critical care consult called to assist with management       Prior Functioning     Home Living Family/patient expects to be discharged to:: Private residence Living Arrangements: Spouse/significant other Available Help at Discharge: Family Type of Home: House Home Access: Stairs to enter Secretary/administrator of Steps: 3 Home Layout: One level Home Equipment: Shower seat;Bedside commode;Cane - single point Prior Function Level of Independence: Independent Communication Communication: HOH Dominant Hand: Right         Vision/Perception     Cognition  Cognition Arousal/Alertness: Awake/alert Behavior During Therapy: WFL for tasks assessed/performed Overall Cognitive Status: Within Functional Limits for tasks assessed    Extremity/Trunk Assessment Upper Extremity Assessment Upper Extremity Assessment: Generalized weakness (r RTC deficit) Lower Extremity Assessment Lower  Extremity Assessment: Generalized weakness  Cervical / Trunk Assessment Cervical / Trunk Assessment: Kyphotic     Mobility Bed Mobility Bed Mobility: Not assessed Transfers Sit to Stand: 1: +2 Total assist Sit to Stand: Patient Percentage: 70% Stand to Sit: 1: +2 Total assist Stand to Sit: Patient Percentage: 80% Details for Transfer Assistance: VCs for hand placement, +2 for assist and stability upon standing (much improved over yesterday)     Exercise Other Exercises Other Exercises: given theraputty for hand strengthening.   Balance Balance Balance Assessed: Yes Static Standing Balance Static Standing - Balance Support: No upper extremity supported (occassional propping on elbows at counter) Static Standing - Level of Assistance: 5: Stand by assistance Static Standing - Comment/# of Minutes: 3 minutes   End of Session OT - End of Session Equipment Utilized During Treatment: Gait belt;Rolling walker Activity Tolerance: Patient tolerated treatment well Patient left: in chair;with call bell/phone within reach;with family/visitor present Nurse Communication: Mobility status  GO     Lilie Vezina,HILLARY 12/19/2012, 4:31 PM [facility], OTR/L  [phone] 12/19/2012

## 2012-12-19 NOTE — Progress Notes (Signed)
Clinical Social Work Department BRIEF PSYCHOSOCIAL ASSESSMENT 12/19/2012  Patient:  KORVIN, VALENTINE     Account Number:  [id number]     Admit date:  12/13/2012  Clinical Social Worker:  Carren Rang  Date/Time:  12/19/2012 02:28 PM  Referred by:  Care Management  Date Referred:  12/19/2012 Referred for  SNF Placement   Other Referral:   Interview type:  Other - See comment Other interview type:   CSW spoke with patient and family at bedside.    PSYCHOSOCIAL DATA Living Status:  WIFE Admitted from facility:   Level of care:   Primary support name:  Phillis Primary support relationship to patient:  SPOUSE Degree of support available:   Good    CURRENT CONCERNS Current Concerns  Post-Acute Placement   Other Concerns:    SOCIAL WORK ASSESSMENT / PLAN Clinical Social Worker received referral for SNF placement at d/c. CSW introduced self and explained reason for visit. Patient had visitor by bedside. CSW explained SNF process and provided SNF packet to patient and family. Family reported their preference was [facility].  CSW encouraged patient and family to think about additional SNF options pending availability of preferred facility. CSW will complete FL2 for MD's signature and will update patient and family when bed offers are received.   Assessment/plan status:  Psychosocial Support/Ongoing Assessment of Needs Other assessment/ plan:   Information/referral to community resources:   SNF packet    PATIENT'S/FAMILY'S RESPONSE TO PLAN OF CARE: Family and patient were agreeable to SNF option. They stated that they preferred [facility] at [facility]. Family states they will look into other facilities as a back up.        Maree Krabbe, MSW, Theresia Majors [phone]

## 2012-12-19 NOTE — Progress Notes (Signed)
PULMONARY  / CRITICAL CARE MEDICINE  Name: Alex Orozco MRN: [id number] DOB: Dec 29, 1929    ADMISSION DATE:  12/13/2012 CONSULTATION DATE:  12/14/12  REFERRING MD :  Dr. Julieanne Cotton PRIMARY SERVICE: PCCM  CHIEF COMPLAINT:  Epistaxis, post angiogram and embolization by IR. Intubated.  BRIEF PATIENT DESCRIPTION:  79 years old male with A. Fib,  CAD s/p CABG and stenting on Plavix, Aspirin and Coumadin. Presents with severe epistaxis. Unable to stop by ENT packing (shoemaker) Went to the IR suite for angiogram and embolization. Transferred intubated to the MICU. Critical care consult called to assist with management.  SIGNIFICANT EVENTS / STUDIES:  7/26 >>Post angiogram and embolization RT IMAX and RT infraorbital arteries for control of epistaxis. 7/27 >>> Clots suctioned out of oropharynx, weaning well on PS 5/5/, Spoke to Dr Annalee Genta, no packing since severely deviated septum, oozing may persist , if Hb drop (significant) may need re-embolisation - call ENT after extubation otherwise, will hold off another 24h  LINES / TUBES: - Peripheral lines  ANTIBIOTICS: Ancef 7/26 >>>7/27  SUBJECTIVE:  SOB improved. PT recommending SNF.    VITAL SIGNS: Temp:  [98.2 F (36.8 C)-99.4 F (37.4 C)] 98.7 F (37.1 C) (07/31 0544) Pulse Rate:  [83-87] 84 (07/31 0544) Resp:  [18-22] 22 (07/31 0544) BP: (120-132)/(58-63) 132/63 mmHg (07/31 0544) SpO2:  [97 %-98 %] 97 % (07/31 0544) FiO2 (%):  [96 %] 96 % (07/31 0838) Room air  INTAKE / OUTPUT: Intake/Output     07/30 0701 - 07/31 0700 07/31 0701 - 08/01 0700   P.O. 480    Total Intake(mL/kg) 480 (4.4)    Urine (mL/kg/hr) 875 (0.3) 350 (1.6)   Total Output 875 350   Net -395 -350          PHYSICAL EXAMINATION: General: NAD HEENT: NCAT, [place] in place, no further bleeding, clot in R nare Heart: irregularly irregular rhythm, normal rate Lungs: resps even, non labored, diminished bases, otherwise CTA Abdomen: Abdomen soft,  not  distended, normoactive bowel sounds. Skin: No rashes noted Neuro: speech and mentation intact, hard of hearing Ext: warm and well perfused, 1+ edema   LABS: CBC Recent Labs     12/17/12  0547  12/18/12  0458  12/19/12  0623  WBC  7.3  7.4  7.1  HGB  7.9*  8.1*  7.7*  HCT  25.0*  24.8*  24.0*  PLT  116*  135*  152    Coag's Recent Labs     12/17/12  0547  INR  1.32    BMET Recent Labs     12/17/12  0547  12/18/12  0458  12/19/12  0623  NA  148*  143  143  K  3.3*  3.6  3.3*  CL  110  106  107  CO2  29  25  25   BUN  37*  43*  45*  CREATININE  2.17*  2.22*  2.28*  GLUCOSE  107*  111*  119*    Electrolytes Recent Labs     12/17/12  0547  12/18/12  0458  12/19/12  0623  CALCIUM  8.7  8.5  8.5   Cardiac Enzymes Recent Labs     12/16/12  1200  PROBNP  4332.0*    Glucose Recent Labs     12/16/12  1553  12/16/12  1959  12/16/12  2349  12/17/12  0359  12/17/12  0717  12/17/12  1108  GLUCAP  100*  137*  110*  107*  110*  120*    Imaging Dg Chest 2 View  12/19/2012   *RADIOLOGY REPORT*  Clinical Data: Cough, CHF.  CHEST - 2 VIEW  Comparison: 12/18/2012  Findings: Prior CABG.  Cardiomegaly.  No overt edema.  No focal opacities or effusions.  No acute bony abnormality.  Degenerative changes in the shoulders bilaterally.  IMPRESSION: Cardiomegaly with vascular congestion.   Original Report Authenticated By: Charlett Nose, M.D.   Dg Chest 2 View  12/18/2012   *RADIOLOGY REPORT*  Clinical Data: chf, wheezing  CHEST - 2 VIEW  Comparison: 12/16/12  Findings: Moderate cardiac enlargement.  Status post CABG.  Mild to moderate central vascular congestion.  Mild peribronchial cuffings. Mild lingular interstitial change.  Mild right lower lobe posterior infiltrate.  IMPRESSION: Vascular congestion with mild lingular interstitial and right lower lobe hazy opacities, possibly representing early developing pulmonary edema.  Particularly regarding the right lower lobe,  pneumonia not excluded.   Original Report Authenticated By: Esperanza Heir, M.D.    ASSESSMENT / PLAN:  PULMONARY A: Previously intubated for airway protection, now extubated. Respiratory distress 7/30 >> likely from hypervolemia and pulmonary edema >> improved 7/31 P:   -continue scheduled xopenex -goal negative fluid balance -O2 to keep SpO2 > 92% as needed -f/u CXR intermittently -pulm hygiene   CARDIOVASCULAR A:  Uncontrolled HTN on admission >> improved. CAD post CABG and stenting on chronic plavix and aspirin AFib on coumadin. P:  -Continue home antihypertensives -diuresis per cards - watch Scr  -Continue to hold coumadin and aspirin. Per cardiology, will not continue plavix at discharge. -IV hydralazine PRN  -Pt to f/u with ENT in 2 weeks after hospital d/c, and then with cards to determine when he can resume coumadin  RENAL A:   CKD stable (baseline Scr appears ~2.0) Hypokalemia Hypernatremia P:   -monitor renal fx, urine outpt -f/u and replace electrolytes as needed  GASTROINTESTINAL A:   Nutrition. P:   GI prophylaxis with protonix Carb modified diet  HEMATOLOGIC A:   Acute blood loss 2nd to epistaxis Coagulopathy of coumadin -corrected wtih vit K & FFP P:  -f/u CBC -transfuse for Hb < 7 or bleeding  INFECTIOUS A:   No evidence of acute infection P:   - monitor  ENDOCRINE A:   DM P:   - Novolog sliding scale  NEUROLOGIC A:   Hard of hearing Deconditioning P:   - PRN fentanyl for pain - PT recommending SNF   PT recommending SNF for short term rehab.  Pt/wife agree.  Will consult SW, pt prefers [facility].   Danford Bad, NP 12/19/2012  9:01 AM Pager: [phone] or [phone]  Reviewed above, examined pt, and agree with assessment/plan.  Respiratory status much improved.  He is still very weak >> needs SNF placement >> he is agreeable to this.  Otherwise medically stable for d/c to SNF when bed available.  Coralyn Helling,  MD [facility] Pulmonary/Critical Care 12/19/2012, 9:20 AM Pager:  [phone] After 3pm call: [phone]

## 2012-12-19 NOTE — Progress Notes (Signed)
Clinical Social Work Department CLINICAL SOCIAL WORK PLACEMENT NOTE 12/19/2012  Patient:  Alex Orozco, Alex Orozco  Account Number:  [id number] Admit date:  12/13/2012  Clinical Social Worker:  Carren Rang  Date/time:  12/19/2012 02:32 PM  Clinical Social Work is seeking post-discharge placement for this patient at the following level of care:   SKILLED NURSING   (*CSW will update this form in Epic as items are completed)   12/19/2012  Patient/family provided with [facility] Department of Clinical Social Work's list of facilities offering this level of care within the geographic area requested by the patient (or if unable, by the patient's family).  12/19/2012  Patient/family informed of their freedom to choose among providers that offer the needed level of care, that participate in Medicare, Medicaid or managed care program needed by the patient, have an available bed and are willing to accept the patient.  12/19/2012  Patient/family informed of [facility]' ownership interest in [facility], as well as of the fact that they are under no obligation to receive care at this facility.  PASARR submitted to EDS on 12/19/2012 PASARR number received from EDS on 12/19/2012  FL2 transmitted to all facilities in geographic area requested by pt/family on  12/19/2012 FL2 transmitted to all facilities within larger geographic area on   Patient informed that his/her managed care company has contracts with or will negotiate with  certain facilities, including the following:     Patient/family informed of bed offers received:   Patient chooses bed at  Physician recommends and patient chooses bed at    Patient to be transferred to  on   Patient to be transferred to facility by   The following physician request were entered in Epic:   Additional Comments:  Maree Krabbe, MSW, [employer] [phone]

## 2012-12-20 LAB — BASIC METABOLIC PANEL
Calcium: 8.6 mg/dL (ref 8.4–10.5)
Chloride: 108 mEq/L (ref 96–112)
Creatinine, Ser: 2.27 mg/dL — ABNORMAL HIGH (ref 0.50–1.35)
GFR calc Af Amer: 30 mL/min — ABNORMAL LOW (ref >90)
GFR calc non Af Amer: 26 mL/min — ABNORMAL LOW (ref >90)

## 2012-12-20 LAB — CBC
HCT: 24.9 % — ABNORMAL LOW (ref 39.0–52.0)
MCHC: 32.5 g/dL (ref 30.0–36.0)
Platelets: 161 10*3/uL (ref 150–400)
RDW: 15 % (ref 11.5–15.5)
WBC: 6.3 10*3/uL (ref 4.0–10.5)

## 2012-12-20 MED ORDER — METOPROLOL TARTRATE 25 MG PO TABS: 75.0000 mg | ORAL_TABLET | Freq: Two times a day (BID) | ORAL | Status: DC

## 2012-12-20 MED ORDER — POTASSIUM CHLORIDE CRYS ER 20 MEQ PO TBCR: 40.0000 meq | EXTENDED_RELEASE_TABLET | Freq: Every day | ORAL | Status: DC

## 2012-12-20 MED ORDER — BOOST / RESOURCE BREEZE PO LIQD: 1.0000 | Freq: Three times a day (TID) | ORAL | Status: DC

## 2012-12-20 MED ORDER — FUROSEMIDE 20 MG PO TABS: 60.0000 mg | ORAL_TABLET | Freq: Two times a day (BID) | ORAL | Status: DC

## 2012-12-20 MED ORDER — LEVALBUTEROL HCL 0.63 MG/3ML IN NEBU: 0.6300 mg | INHALATION_SOLUTION | RESPIRATORY_TRACT | Status: AC | PRN

## 2012-12-20 NOTE — Progress Notes (Signed)
Clinical Social Worker facilitated patient discharge by contacting the patient, family and facility, [facility]. Patient agreeable to this plan and arranging transport via family . CSW will sign off, as social work intervention is no longer needed.  Maree Krabbe, MSW, [facility] [phone]

## 2012-12-20 NOTE — Discharge Summary (Signed)
Physician Discharge Summary  Patient ID: Alex Orozco MRN: [id number] DOB/AGE: [birth year] 77 y.o.  Admit date: 12/13/2012 Discharge date: 12/20/2012    Discharge Diagnoses:  Acute Respiratory Distress Acute Pulmonary Edema Uncontrolled Hypertension CAD s/p CABG with Stents Afib on Coumadin CKD Hypokalemia Hypernatremia Acute Blood Loss Anemia Coagulopathy Diabetes Hard of Hearing Deconditioning                                                                      DISCHARGE PLAN BY DIAGNOSIS     Acute Respiratory Failure - Previously intubated for airway protection, now extubated. Respiratory distress 7/30 likely from hypervolemia and pulmonary edema >>resolved 7/31  Epistaxis  Discharge Plan: -PRN Xopenex -Lasix for negative fluid balance.  WILL NEED REPEAT BMP in one week to evaluate sr cr on increased dose lasix (was on 60 Qam and 40 Qpm) -O2 to keep SpO2 > 92% as needed  -f/u CXR intermittently  -pulm hygiene  -would not disturb nasal clot, no blowing of nose if possible    Uncontrolled HTN on admission >> improved.  CAD post CABG and stenting - previously on plavix and aspirin  AFib on coumadin.   Discharge Plan: -Continue home antihypertensives as below  -Continue to hold coumadin.  Per cardiology, will not continue plavix at discharge.  -Pt to f/u with ENT in 2 weeks after hospital d/c, and then with cards to determine when he can resume coumadin    CKD stable (baseline Scr appears ~2.0)  Hypokalemia  Hypernatremia   Discharge Plan: -monitor renal fx, urine outpt  -f/u and replace electrolytes as needed     Nutrition.   Discharge Plan: Resume home pepcid Carb modified, heart healthy   Acute blood loss 2nd to epistaxis  Coagulopathy of coumadin -corrected wtih vit K & FFP   Discharge Plan: -f/u CBC in one week -would not transfuse unless Hgb <7   DM   Discharge Plan: - Novolog sliding scale     Hard of hearing  Deconditioning    Discharge Plan: - PRN fentanyl for pain  - PT recommending SNF                  DISCHARGE SUMMARY   Alex Orozco is a 79 y.o. y/o male with a PMH of  A. Fib, CAD s/p CABG and stenting on Plavix, Aspirin and Coumadin. Admitted 7/25 with severe epistaxis. Attempts to control bleeding per ENT (Dr. Annalee Genta) were unfortunately unsuccessful.  Patient required IR angiogram and embolization. Patient was intubated in IR suite for airway protection.  Of note, he was significantly hypertensive on admit.  Cardiology consulted for input on hypertension & anticoagulation in the setting of epistaxis and known afib.  Coagulopathy reversed with Vitamin K & FFP.  Anticoagulation held during admit and also for discharge.  Extubated without difficulty.  Empirically treated with ancef for procedure.  Patient will follow up with ENT and then Cardiology for evaluation of timing for restart of coumadin / plavix.                 SIGNIFICANT EVENTS / STUDIES:  7/26 - Post angiogram and embolization RT IMAX and RT infraorbital arteries for control of epistaxis.  7/27 - Clots suctioned out of oropharynx,  weaning well on PS 5/5/, Spoke to Dr Annalee Genta, no packing since severely deviated septum, oozing may persist , if Hb drop (significant) may need re-embolisation - call ENT after extubation otherwise, will hold off another 24h    ANTIBIOTICS:  Ancef 7/26 >>>7/27   Discharge Exam: General: NAD  HEENT: NCAT, [place] in place, no further bleeding, clot in R nare  Heart: irregularly irregular rhythm, normal rate  Lungs: resps even, non labored, diminished bases, otherwise CTA  Abdomen: Abdomen soft, not distended, normoactive bowel sounds.  Skin: No rashes noted  Neuro: speech and mentation intact, hard of hearing  Ext: warm and well perfused, 1+ edema     Filed Vitals:   12/19/12 2202 12/20/12 0207 12/20/12 0448 12/20/12 0948  BP: 125/57  121/51   Pulse: 84  76   Temp: 97.4 F (36.3 C)  98 F (36.7 C)    TempSrc: Oral  Oral   Resp: 18  20   Height:      Weight:      SpO2: 97% 95% 97% 96%     Discharge Labs  BMET  Recent Labs Lab 12/17/12 0100 12/17/12 0547 12/18/12 0458 12/19/12 0623 12/20/12 0518  NA 146* 148* 143 143 144  K 3.4* 3.3* 3.6 3.3* 3.7  CL 109 110 106 107 108  CO2 29 29 25 25 26   GLUCOSE 109* 107* 111* 119* 102*  BUN 37* 37* 43* 45* 46*  CREATININE 2.12* 2.17* 2.22* 2.28* 2.27*  CALCIUM 8.5 8.7 8.5 8.5 8.6    CBC  Recent Labs Lab 12/18/12 0458 12/19/12 0623 12/20/12 0518  HGB 8.1* 7.7* 8.1*  HCT 24.8* 24.0* 24.9*  WBC 7.4 7.1 6.3  PLT 135* 152 161    Anti-Coagulation  Recent Labs Lab 12/13/12 1240 12/14/12 0328 12/15/12 0429 12/16/12 0406 12/17/12 0547  INR 2.43* 1.83* 1.26 1.25 1.32    Discharge Orders   Future Appointments Provider Department Dept Phone   01/08/2013 9:30 AM Rosalio Macadamia, NP [employer] [facility] [place]) [phone]   01/22/2013 10:00 AM [facility] [phone]   07/02/2013 1:45 PM [name], MD [facility] [phone]   Future Orders Complete By Expires     Call MD for:  difficulty breathing, headache or visual disturbances  As directed     Call MD for:  persistant dizziness or light-headedness  As directed     Call MD for:  persistant nausea and vomiting  As directed     Call MD for:  severe uncontrolled pain  As directed     Call MD for:  temperature >100.4  As directed     Diet - low sodium heart healthy  As directed     Discharge instructions  As directed     Comments:      1.  Pt will need repeat BMP in one week to review sr cr / lytes 2.  Follow up with ENT in 2 weeks and Cardiology.  HOLD all anticoagulation until after follow up with ENT / Cards.    Increase activity slowly  As directed             Follow-up Information   Follow up with Osborn Coho, MD On 01/01/2013. (1:00pm )    Contact information:   [address]  [address] [place] [address] [phone]       Follow up with Kaleen Mask, MD On 12/30/2012. (10:00am )    Contact information:  [address] [place] [address] [phone]       Follow up with [name]., MD On 01/08/2013. (9:30am - Norma Fredrickson NP )    Contact information:   [address] [place] [address] [phone]          Medication List    STOP taking these medications       clopidogrel 75 MG tablet  Commonly known as:  PLAVIX     warfarin 5 MG tablet  Commonly known as:  COUMADIN      TAKE these medications       amLODipine 2.5 MG tablet  Commonly known as:  NORVASC  Take 1 tablet (2.5 mg total) by mouth daily.     aspirin 81 MG tablet  Take 81 mg by mouth daily.     colchicine 0.6 MG tablet  Take 0.6 mg by mouth daily as needed. For gout     doxazosin 4 MG tablet  Commonly known as:  CARDURA  Take 1 tablet (4 mg total) by mouth at bedtime.     famotidine 10 MG tablet  Commonly known as:  PEPCID  Take 10 mg by mouth every morning. For acid     febuxostat 40 MG tablet  Commonly known as:  ULORIC  Take 40 mg by mouth daily as needed. For gout     feeding supplement Liqd  Take 1 Container by mouth 3 (three) times daily between meals.     fish oil-omega-3 fatty acids 1000 MG capsule  Take 1 g by mouth daily.     furosemide 20 MG tablet  Commonly known as:  LASIX  Take 3 tablets (60 mg total) by mouth 2 (two) times daily.     hydroxychloroquine 200 MG tablet  Commonly known as:  PLAQUENIL  Take 200 mg by mouth 2 (two) times daily.     Iron 240 (27 FE) MG Tabs  Take 1 tablet by mouth daily.     levalbuterol 0.63 MG/3ML nebulizer solution  Commonly known as:  XOPENEX  Take 3 mLs (0.63 mg total) by nebulization every 4 (four) hours as needed for wheezing.     metoprolol tartrate 25 MG tablet  Commonly known as:  LOPRESSOR  Take 3 tablets (75 mg total) by mouth 2 (two) times  daily.     potassium chloride SA 20 MEQ tablet  Commonly known as:  K-DUR,KLOR-CON  Take 2 tablets (40 mEq total) by mouth daily.     rosuvastatin 10 MG tablet  Commonly known as:  CRESTOR  Take 10 mg by mouth every evening.     Vitamin D-3 5000 UNITS Tabs  Take 1 tablet by mouth daily.          Disposition: Discharge to [facility]  Discharged Condition: Alex Orozco has met maximum benefit of inpatient care and is medically stable and cleared for discharge.  Patient is pending follow up as above.      Time spent on disposition:  Greater than 35 minutes.   Signed: Canary Brim, NP-C [facility] Pgr: [phone] Office: [phone]  Coralyn Helling, MD [facility] Pulmonary/Critical Care 12/20/2012, 10:29 AM Pager:  [phone] After 3pm call: [phone]

## 2012-12-20 NOTE — Progress Notes (Signed)
Discharged to snf with family office visits in place teaching done  

## 2012-12-20 NOTE — Progress Notes (Signed)
Pt still not able to wear CPAP due to nose bleed/clot.

## 2012-12-20 NOTE — Care Management Note (Signed)
    Page 1 of 1   12/20/2012     5:07:01 PM   CARE MANAGEMENT NOTE 12/20/2012  Patient:  EARL, LOSEE   Account Number:  [id number]  Date Initiated:  12/16/2012  Documentation initiated by:  [facility]  Subjective/Objective Assessment:   epistaxus requiring intubation.  On coags.     Action/Plan:   Anticipated DC Date:  12/18/2012   Anticipated DC Plan:  HOME W HOME HEALTH SERVICES      DC Planning Services  CM consult      Choice offered to / List presented to:             Status of service:  Completed, signed off Medicare Important Message given?   (If response is "NO", the following Medicare IM given date fields will be blank) Date Medicare IM given:   Date Additional Medicare IM given:    Discharge Disposition:  SKILLED NURSING FACILITY  Per UR Regulation:  Reviewed for med. necessity/level of care/duration of stay  If discussed at Long Length of Stay Meetings, dates discussed:    Comments:  ContactBryar, Dahms Spouse [phone]   12/20/12 Osmond Steckman,RN,BSN [phone] PT DISCHARGED TO SNF TODAY, PER CSW ARRANGEMENTS.

## 2012-12-20 NOTE — Progress Notes (Signed)
Clinical Social Work Department CLINICAL SOCIAL WORK PLACEMENT NOTE 12/20/2012  Patient:  Alex Orozco, Alex Orozco  Account Number:  [id number] Admit date:  12/13/2012  Clinical Social Worker:  Carren Rang  Date/time:  12/19/2012 02:32 PM  Clinical Social Work is seeking post-discharge placement for this patient at the following level of care:   SKILLED NURSING   (*CSW will update this form in Epic as items are completed)   12/19/2012  Patient/family provided with [facility] Department of Clinical Social Work's list of facilities offering this level of care within the geographic area requested by the patient (or if unable, by the patient's family).  12/19/2012  Patient/family informed of their freedom to choose among providers that offer the needed level of care, that participate in Medicare, Medicaid or managed care program needed by the patient, have an available bed and are willing to accept the patient.  12/19/2012  Patient/family informed of [facility]' ownership interest in [facility], as well as of the fact that they are under no obligation to receive care at this facility.  PASARR submitted to EDS on 12/19/2012 PASARR number received from EDS on 12/19/2012  FL2 transmitted to all facilities in geographic area requested by pt/family on  12/19/2012 FL2 transmitted to all facilities within larger geographic area on   Patient informed that his/her managed care company has contracts with or will negotiate with  certain facilities, including the following:     Patient/family informed of bed offers received:  12/19/2012 Patient chooses bed at [facility] [place] Physician recommends and patient chooses bed at    [facility] [place] on  12/20/2012 Patient to be transferred to facility by EMS  The following physician request were entered in Epic:   Additional Comments:  Maree Krabbe, MSW,  [facility] [phone]

## 2013-01-01 ENCOUNTER — Telehealth: Payer: Self-pay | Admitting: Cardiovascular Disease

## 2013-01-01 DIAGNOSIS — I5032 Chronic diastolic (congestive) heart failure: Secondary | ICD-10-CM

## 2013-01-01 DIAGNOSIS — R0600 Dyspnea, unspecified: Secondary | ICD-10-CM

## 2013-01-01 MED ORDER — TORSEMIDE 20 MG PO TABS: 20.0000 mg | ORAL_TABLET | Freq: Two times a day (BID) | ORAL | Status: DC

## 2013-01-01 NOTE — Telephone Encounter (Signed)
Per Dr Elease Hashimoto, Hold lasix Start Demadex 20 mg BID Continue same k+ dose 20 meq BID bmet in 1 week.

## 2013-01-01 NOTE — Telephone Encounter (Signed)
Left pt/wife a message to call back. 

## 2013-01-01 NOTE — Telephone Encounter (Signed)
Pt's wife called regarding pt. Wife states pt was placed in a nursing home after D/C from the hospital. Pt came home from the nursing home this last Friday. According to wife since Saturday pt's breathing is getting worse, his LE are edematous. Pt gets out of breath with mild activity. According to pt's wife, pt was getting physical therapy at the nursing home, and was doing well. Pt is taking Lasix 60 mg twice a day. Wife thinks that pt may need to increased his Lasix dose, because he is not getting read of the fluids in his system. Pt is in the ENT doctor's office for F/U visit.

## 2013-01-01 NOTE — Telephone Encounter (Signed)
Pt's wife aware of MD's recommendations. A prescription for Demadex 20 mg twice a day send to [facility]. Pt's wife states that they are planning to go [place] the beach on vacation with their family this Saturday to next Saturday the 23 rd and come for labs on August the 25 th, if it is okay with Dr. Elease Hashimoto. BMET was order, but has not been scheduled.

## 2013-01-01 NOTE — Telephone Encounter (Signed)
New Prob  Pt wife states that pt seems to be getting worse. She said he is short of breath more. She is on the way to take him to see another doctor.

## 2013-01-02 MED ORDER — POTASSIUM CHLORIDE CRYS ER 20 MEQ PO TBCR: 40.0000 meq | EXTENDED_RELEASE_TABLET | Freq: Every day | ORAL | Status: DC

## 2013-01-02 NOTE — Telephone Encounter (Signed)
Pt lab date set/ refill k+, confirmed return visit for coumadin discussion since stopped in hospital per dc instructions.

## 2013-01-08 ENCOUNTER — Encounter: Payer: Medicare Other | Admitting: Nurse Practitioner

## 2013-01-13 ENCOUNTER — Other Ambulatory Visit (INDEPENDENT_AMBULATORY_CARE_PROVIDER_SITE_OTHER): Payer: Medicare Other

## 2013-01-13 DIAGNOSIS — R0609 Other forms of dyspnea: Secondary | ICD-10-CM

## 2013-01-13 DIAGNOSIS — I5032 Chronic diastolic (congestive) heart failure: Secondary | ICD-10-CM

## 2013-01-13 DIAGNOSIS — R0600 Dyspnea, unspecified: Secondary | ICD-10-CM

## 2013-01-13 LAB — BASIC METABOLIC PANEL
Chloride: 104 mEq/L (ref 96–112)
GFR: 22.86 mL/min — ABNORMAL LOW (ref >60.00)
Potassium: 4.3 mEq/L (ref 3.5–5.1)
Sodium: 141 mEq/L (ref 135–145)

## 2013-01-15 ENCOUNTER — Ambulatory Visit (INDEPENDENT_AMBULATORY_CARE_PROVIDER_SITE_OTHER): Payer: Medicare Other | Admitting: Physician Assistant

## 2013-01-15 ENCOUNTER — Inpatient Hospital Stay (HOSPITAL_COMMUNITY)
Admission: AD | Admit: 2013-01-15 | Discharge: 2013-01-23 | DRG: 292 | Disposition: A | Discharge status: Home / Self Care | Payer: Medicare Other | Source: Ambulatory Visit | Attending: Cardiology | Admitting: Cardiology

## 2013-01-15 ENCOUNTER — Encounter: Payer: Self-pay | Admitting: Physician Assistant

## 2013-01-15 ENCOUNTER — Encounter (HOSPITAL_COMMUNITY): Payer: Self-pay | Admitting: General Practice

## 2013-01-15 VITALS — BP 160/78 | HR 83 | Ht 66.0 in | Wt 256.0 lb

## 2013-01-15 DIAGNOSIS — E785 Hyperlipidemia, unspecified: Secondary | ICD-10-CM | POA: Diagnosis present

## 2013-01-15 DIAGNOSIS — R040 Epistaxis: Secondary | ICD-10-CM | POA: Diagnosis present

## 2013-01-15 DIAGNOSIS — I10 Essential (primary) hypertension: Secondary | ICD-10-CM

## 2013-01-15 DIAGNOSIS — I5023 Acute on chronic systolic (congestive) heart failure: Secondary | ICD-10-CM

## 2013-01-15 DIAGNOSIS — I428 Other cardiomyopathies: Secondary | ICD-10-CM | POA: Diagnosis present

## 2013-01-15 DIAGNOSIS — I4891 Unspecified atrial fibrillation: Secondary | ICD-10-CM

## 2013-01-15 DIAGNOSIS — J984 Other disorders of lung: Secondary | ICD-10-CM | POA: Diagnosis present

## 2013-01-15 DIAGNOSIS — I5033 Acute on chronic diastolic (congestive) heart failure: Secondary | ICD-10-CM

## 2013-01-15 DIAGNOSIS — Z79899 Other long term (current) drug therapy: Secondary | ICD-10-CM

## 2013-01-15 DIAGNOSIS — G4733 Obstructive sleep apnea (adult) (pediatric): Secondary | ICD-10-CM | POA: Diagnosis present

## 2013-01-15 DIAGNOSIS — N184 Chronic kidney disease, stage 4 (severe): Secondary | ICD-10-CM | POA: Diagnosis present

## 2013-01-15 DIAGNOSIS — Z951 Presence of aortocoronary bypass graft: Secondary | ICD-10-CM

## 2013-01-15 DIAGNOSIS — I5032 Chronic diastolic (congestive) heart failure: Secondary | ICD-10-CM

## 2013-01-15 DIAGNOSIS — I2510 Atherosclerotic heart disease of native coronary artery without angina pectoris: Secondary | ICD-10-CM | POA: Diagnosis present

## 2013-01-15 DIAGNOSIS — I252 Old myocardial infarction: Secondary | ICD-10-CM

## 2013-01-15 DIAGNOSIS — I509 Heart failure, unspecified: Secondary | ICD-10-CM

## 2013-01-15 DIAGNOSIS — I350 Nonrheumatic aortic (valve) stenosis: Secondary | ICD-10-CM

## 2013-01-15 DIAGNOSIS — D696 Thrombocytopenia, unspecified: Secondary | ICD-10-CM | POA: Diagnosis present

## 2013-01-15 DIAGNOSIS — D649 Anemia, unspecified: Secondary | ICD-10-CM | POA: Diagnosis present

## 2013-01-15 DIAGNOSIS — N189 Chronic kidney disease, unspecified: Secondary | ICD-10-CM

## 2013-01-15 DIAGNOSIS — N183 Chronic kidney disease, stage 3 unspecified: Secondary | ICD-10-CM

## 2013-01-15 DIAGNOSIS — R0602 Shortness of breath: Secondary | ICD-10-CM

## 2013-01-15 DIAGNOSIS — I359 Nonrheumatic aortic valve disorder, unspecified: Secondary | ICD-10-CM | POA: Diagnosis present

## 2013-01-15 HISTORY — DX: Nonrheumatic mitral (valve) insufficiency: I34.0

## 2013-01-15 HISTORY — DX: Epistaxis: R04.0

## 2013-01-15 LAB — COMPREHENSIVE METABOLIC PANEL
ALT: 11 U/L (ref 0–53)
AST: 16 U/L (ref 0–37)
Albumin: 3.5 g/dL (ref 3.5–5.2)
Alkaline Phosphatase: 49 U/L (ref 39–117)
BUN: 36 mg/dL — ABNORMAL HIGH (ref 6–23)
Chloride: 104 mEq/L (ref 96–112)
Potassium: 4.5 mEq/L (ref 3.5–5.1)
Sodium: 143 mEq/L (ref 135–145)
Total Bilirubin: 0.6 mg/dL (ref 0.3–1.2)
Total Protein: 6.3 g/dL (ref 6.0–8.3)

## 2013-01-15 LAB — CBC WITH DIFFERENTIAL/PLATELET
Basophils Absolute: 0 10*3/uL (ref 0.0–0.1)
Basophils Relative: 0 % (ref 0–1)
Eosinophils Absolute: 0.3 10*3/uL (ref 0.0–0.7)
Hemoglobin: 10.2 g/dL — ABNORMAL LOW (ref 13.0–17.0)
MCHC: 31.5 g/dL (ref 30.0–36.0)
Monocytes Relative: 6 % (ref 3–12)
Neutro Abs: 6.5 10*3/uL (ref 1.7–7.7)
Neutrophils Relative %: 84 % — ABNORMAL HIGH (ref 43–77)
Platelets: 113 10*3/uL — ABNORMAL LOW (ref 150–400)

## 2013-01-15 LAB — PRO B NATRIURETIC PEPTIDE: Pro B Natriuretic peptide (BNP): 8620 pg/mL — ABNORMAL HIGH (ref 0–450)

## 2013-01-15 MED ORDER — FERROUS SULFATE 325 (65 FE) MG PO TABS
325.0000 mg | ORAL_TABLET | Freq: Every day | ORAL | Status: DC
  Administered 2013-01-16 – 2013-01-23 (×8): 325 mg via ORAL
  Filled 2013-01-15 (×9): qty 1

## 2013-01-15 MED ORDER — COLCHICINE 0.6 MG PO TABS
0.6000 mg | ORAL_TABLET | Freq: Every day | ORAL | Status: DC | PRN
  Filled 2013-01-15: qty 1

## 2013-01-15 MED ORDER — SODIUM CHLORIDE 0.9 % IJ SOLN: 3.0000 mL | INTRAMUSCULAR | Status: DC | PRN

## 2013-01-15 MED ORDER — IRON 240 (27 FE) MG PO TABS: 1.0000 | ORAL_TABLET | Freq: Every day | ORAL | Status: DC

## 2013-01-15 MED ORDER — SODIUM CHLORIDE 0.9 % IJ SOLN
3.0000 mL | Freq: Two times a day (BID) | INTRAMUSCULAR | Status: DC
  Administered 2013-01-15 – 2013-01-22 (×13): 3 mL via INTRAVENOUS

## 2013-01-15 MED ORDER — HEPARIN (PORCINE) IN NACL 100-0.45 UNIT/ML-% IJ SOLN
1500.0000 [IU]/h | INTRAMUSCULAR | Status: DC
  Administered 2013-01-15: 1300 [IU]/h via INTRAVENOUS
  Administered 2013-01-16 (×2): 1500 [IU]/h via INTRAVENOUS
  Filled 2013-01-15 (×3): qty 250

## 2013-01-15 MED ORDER — FUROSEMIDE 10 MG/ML IJ SOLN
80.0000 mg | Freq: Two times a day (BID) | INTRAMUSCULAR | Status: DC
  Administered 2013-01-15 – 2013-01-19 (×9): 80 mg via INTRAVENOUS
  Filled 2013-01-15 (×13): qty 8

## 2013-01-15 MED ORDER — DOXAZOSIN MESYLATE 4 MG PO TABS: 4.0000 mg | ORAL_TABLET | Freq: Every day | ORAL | Status: DC

## 2013-01-15 MED ORDER — SODIUM CHLORIDE 0.9 % IV SOLN: 250.0000 mL | INTRAVENOUS | Status: DC | PRN

## 2013-01-15 MED ORDER — HEPARIN BOLUS VIA INFUSION
2800.0000 [IU] | Freq: Once | INTRAVENOUS | Status: AC
  Administered 2013-01-15: 2800 [IU] via INTRAVENOUS
  Filled 2013-01-15: qty 2800

## 2013-01-15 MED ORDER — AMLODIPINE BESYLATE 2.5 MG PO TABS
2.5000 mg | ORAL_TABLET | Freq: Every day | ORAL | Status: DC
  Administered 2013-01-16 – 2013-01-18 (×3): 2.5 mg via ORAL
  Filled 2013-01-15 (×4): qty 1

## 2013-01-15 MED ORDER — ALBUTEROL SULFATE (5 MG/ML) 0.5% IN NEBU: 2.5000 mg | INHALATION_SOLUTION | RESPIRATORY_TRACT | Status: DC | PRN

## 2013-01-15 MED ORDER — ASPIRIN EC 81 MG PO TBEC
81.0000 mg | DELAYED_RELEASE_TABLET | Freq: Every day | ORAL | Status: DC
  Administered 2013-01-15 – 2013-01-23 (×9): 81 mg via ORAL
  Filled 2013-01-15 (×9): qty 1

## 2013-01-15 MED ORDER — METOPROLOL TARTRATE 50 MG PO TABS
75.0000 mg | ORAL_TABLET | Freq: Two times a day (BID) | ORAL | Status: DC
  Administered 2013-01-15 – 2013-01-18 (×6): 75 mg via ORAL
  Filled 2013-01-15 (×8): qty 1

## 2013-01-15 MED ORDER — WARFARIN SODIUM 5 MG PO TABS
5.0000 mg | ORAL_TABLET | Freq: Once | ORAL | Status: AC
  Administered 2013-01-15: 5 mg via ORAL
  Filled 2013-01-15: qty 1

## 2013-01-15 MED ORDER — WARFARIN - PHARMACIST DOSING INPATIENT
Freq: Every day | Status: DC
  Administered 2013-01-21 – 2013-01-23 (×3)

## 2013-01-15 MED ORDER — ATORVASTATIN CALCIUM 20 MG PO TABS
20.0000 mg | ORAL_TABLET | Freq: Every day | ORAL | Status: DC
  Administered 2013-01-15 – 2013-01-23 (×9): 20 mg via ORAL
  Filled 2013-01-15 (×9): qty 1

## 2013-01-15 MED ORDER — FEBUXOSTAT 40 MG PO TABS
40.0000 mg | ORAL_TABLET | Freq: Every day | ORAL | Status: DC | PRN
  Filled 2013-01-15: qty 1

## 2013-01-15 MED ORDER — HYDROXYCHLOROQUINE SULFATE 200 MG PO TABS
200.0000 mg | ORAL_TABLET | Freq: Two times a day (BID) | ORAL | Status: DC
  Administered 2013-01-15 – 2013-01-23 (×17): 200 mg via ORAL
  Filled 2013-01-15 (×18): qty 1

## 2013-01-15 MED ORDER — ONDANSETRON HCL 4 MG/2ML IJ SOLN: 4.0000 mg | Freq: Four times a day (QID) | INTRAMUSCULAR | Status: DC | PRN

## 2013-01-15 MED ORDER — ACETAMINOPHEN 325 MG PO TABS: 650.0000 mg | ORAL_TABLET | ORAL | Status: DC | PRN

## 2013-01-15 MED ORDER — ASPIRIN 81 MG PO TABS: 81.0000 mg | ORAL_TABLET | Freq: Every day | ORAL | Status: DC

## 2013-01-15 MED ORDER — POTASSIUM CHLORIDE CRYS ER 20 MEQ PO TBCR
40.0000 meq | EXTENDED_RELEASE_TABLET | Freq: Every day | ORAL | Status: DC
  Administered 2013-01-15 – 2013-01-23 (×9): 40 meq via ORAL
  Filled 2013-01-15 (×9): qty 2

## 2013-01-15 MED ORDER — FAMOTIDINE 10 MG PO TABS
10.0000 mg | ORAL_TABLET | Freq: Every morning | ORAL | Status: DC
  Administered 2013-01-16 – 2013-01-23 (×8): 10 mg via ORAL
  Filled 2013-01-15 (×8): qty 1

## 2013-01-15 MED ORDER — DOXAZOSIN MESYLATE 4 MG PO TABS
4.0000 mg | ORAL_TABLET | Freq: Every day | ORAL | Status: DC
  Administered 2013-01-15 – 2013-01-22 (×8): 4 mg via ORAL
  Filled 2013-01-15 (×9): qty 1

## 2013-01-15 NOTE — Patient Instructions (Addendum)
PT ADMITTED TODAY TO [facility]

## 2013-01-15 NOTE — H&P (Signed)
Admission History and Physical   Date:  01/15/2013   ID:  Alex Orozco, DOB 22-Jul-1929, MRN [id number]  PCP:  Kaleen Mask, MD  Cardiologist:  Dr. Delane Ginger     History of Present Illness: Alex Orozco is a 79 y.o. male who returns for followup after recent admission to the hospital 7/25-8/1 with severe epistaxis requiring mechanical ventilation for airway protection c/b blood loss anemia, a/c diastolic CHF and deconditioning.  He has a hx of CAD, s/p CABG, s/p NSTEMI 9/10 treated with a DES to the SVG-D3, restrictive lung disease, CKD, diastolic CHF, HTN, HL, moderate AS, AFib, OSA, anemia and thrombocytopenia. He was admitted 08/2011 with CHF in the setting of community-acquired pneumonia.  Echo 08/25/11: Mild LVH, EF 55-65%, grade 3 diastolic dysfunction, mild AS (Mean gradient 19), mild AI, Mild BAE.   He presented to the hospital for right maxillary and facial artery branch embolization by interventional radiology for severe uncontrolled epistaxis that had failed outpatient treatment. He was placed on the ventilator for airway protection. Antiplatelets and Coumadin were held. Cardiology saw the patient. His aspirin was eventually resumed. It was felt that he should remain off of Plavix. It was felt that he would eventually restart Coumadin when cleared by ENT as an outpatient. Patient did have some difficulty with volume overload requiring IV Lasix.  Since d/c, he has called in with c/o's worsening dyspnea.  He was changed from Lasix to Demadex 20 bid.  He has not had much improvement.  He notes NYHA III-IV symptoms.  Sleeps on 2 pillows.  No PND.  No chest pain.  No syncope.  Weights are up 17 pounds since d/c.  Of note, he has seen ENT and has been told he can resume coumadin.    Labs (4/13):   K 3.8, creatinine 1.84, CE's neg x 3, Hgb 10.8, PLT 146; CXR with mild CHF.  Labs (8/13):  HDL 38.3, LDL 27 Labs (7/14):  Cr 2.59, ALT 13, Hgb 11.0 Labs (8/14):  K 4.3, Cr 2.8, Hgb 8.1 (nadir  7.6)   Wt Readings from Last 3 Encounters:  01/15/13 256 lb (116.121 kg)  12/16/12 239 lb 13.8 oz (108.8 kg)  12/16/12 239 lb 13.8 oz (108.8 kg)     Past Medical History  Diagnosis Date  . Coronary artery disease     a.  s/p CABG;  b. NSTEMI 9/10:  LHC with S-RCA ok, S-D1 occluded (chronic), S-D3 occluded, L-LAD ok;  PCI:  Endeavour DES to S-D3;  c. 10/2011 Lexiscan MV EF 37%, medium sized fixed defect over basilar to mid lateral wall.  No ishcemia.   . Lung disease, restrictive   . History of PFTs 01/20/2009  . Pleural effusion   . Chronic renal insufficiency   . CHF (congestive heart failure)     a. Previous EF 40-50%;  b. Echo 08/2011: mild LVH, EF 55-65, grade 3 diast dysfxn, mild AS (mean 19), mild AI, mild BAE  . Hypertension   . Dyslipidemia   . Aortic stenosis     a. echo 4/13: mild AS with mean 19 mmHg  . Atrial fibrillation     a. Chronic coumadin  . Anemia   . Difficult intubation   . Shortness of breath   . Arthritis   . S/P CABG x 5 1990    Past Surgical History  Procedure Laterality Date  . Coronary artery bypass graft  1999  . Coronary angioplasty with stent placement  2010  DES to the SVG-D3 (100%->0%). LAD 100%, CFX 90%, RCA 100%; LIMA-LAD OK, SVG-D1 100%, SVG-RCA OK  . Back surgery    . Hernia repair    . Dccv    . Cataract extraction      bilateral  . Radiology with anesthesia N/A 12/13/2012    Procedure: RADIOLOGY WITH ANESTHESIA;  Surgeon: Oneal Grout, MD;  Location: [facility] OR;  Service: Radiology;  Laterality: N/A;      Current Outpatient Prescriptions  Medication Sig Dispense Refill  . amLODipine (NORVASC) 2.5 MG tablet Take 1 tablet (2.5 mg total) by mouth daily.  90 tablet  3  . aspirin 81 MG tablet Take 81 mg by mouth daily.        . Cholecalciferol (VITAMIN D-3) 5000 UNITS TABS Take 1 tablet by mouth daily.       . colchicine 0.6 MG tablet Take 0.6 mg by mouth daily as needed. For gout      . doxazosin (CARDURA) 4 MG tablet Take 1 tablet  (4 mg total) by mouth at bedtime.  30 tablet  5  . famotidine (PEPCID) 10 MG tablet Take 10 mg by mouth every morning. For acid      . febuxostat (ULORIC) 40 MG tablet Take 40 mg by mouth daily as needed. For gout      . feeding supplement (RESOURCE BREEZE) LIQD Take 1 Container by mouth 3 (three) times daily between meals.    0  . Ferrous Gluconate (IRON) 240 (27 FE) MG TABS Take 1 tablet by mouth daily.      . fish oil-omega-3 fatty acids 1000 MG capsule Take 1 g by mouth daily.       . hydroxychloroquine (PLAQUENIL) 200 MG tablet Take 200 mg by mouth 2 (two) times daily.       Marland Kitchen levalbuterol (XOPENEX) 0.63 MG/3ML nebulizer solution Take 3 mLs (0.63 mg total) by nebulization every 4 (four) hours as needed for wheezing.  3 mL  12  . metoprolol tartrate (LOPRESSOR) 25 MG tablet Take 3 tablets (75 mg total) by mouth 2 (two) times daily.      . potassium chloride SA (K-DUR,KLOR-CON) 20 MEQ tablet Take 2 tablets (40 mEq total) by mouth daily.  180 tablet  3  . rosuvastatin (CRESTOR) 10 MG tablet Take 10 mg by mouth every evening.      . torsemide (DEMADEX) 20 MG tablet Take 1 tablet (20 mg total) by mouth 2 (two) times daily.  60 tablet  6   No current facility-administered medications for this visit.    Allergies:   No Known Allergies  Social History:  The patient  reports that he has never smoked. He has never used smokeless tobacco. He reports that he does not drink alcohol or use illicit drugs.   Family History:  The patient's family history includes Heart attack in his father; Heart failure in his mother; Hypertension in his father.   ROS:  Please see the history of present illness.   He has a cough with scant clear sputum.   All other systems reviewed and negative.   PHYSICAL EXAM: VS:  BP 160/78  Pulse 83  Ht 5\' 6"  (1.676 m)  Wt 256 lb (116.121 kg)  BMI 41.34 kg/m2 Well nourished, well developed, in no acute distress HEENT: normal aside from periorbital edema Neck: + JVD at 90  degrees Cardiac:  normal S1, S2; irreg irreg rhythm; no murmur Lungs:  Coarse breath sounds at the bases bilaterally, no  wheezing or rhonchi   Abd:  distended Ext: 1+ bilateral LE edema (compression stockings in place) Skin: warm and dry Neuro:  CNs 2-12 intact, no focal abnormalities noted  EKG:  Atrial fibrillation, HR 83, normal axis, nonspecific ST-T wave changes     ASSESSMENT AND PLAN:  1. Acute on Chronic Diastolic CHF:  He is significantly volume overloaded.  He is NYHA Class III-IV.  He has stage III-IV CKD.  I do not think he would have much benefit from continued attempts at OP diuresis.  I have recommended admitting him to the hospital for IV diuresis.  I d/w Dr. Marca Ancona (DOD) who agreed.  I will place him Lasix 80 IV bid.  Monitor renal function closely.   2. Atrial Fibrillation:  He has been cleared to resume coumadin.  I will request notes from Dr. Annalee Genta.  I will place him on IV heparin (easier to turn off if he has recurrent bleeding) and coumadin.  D/c heparin once INR 2.   3. Epistaxis:  No recurrence.  F/u with ENT as planned. 4. CAD:  No angina.  He is on ASA.  He will not resume Plavix.  Continue statin.  5. CKD:  Keep close eye on renal fxn with IV diuresis.   6. Anemia:  Check f/u CBC in the hospital.   7. Disposition:  Admit to [facility] today.   Signed, Tereso Newcomer, PA-C  01/15/2013 10:18 AM    Patient discussed with Tereso Newcomer, agree with the above plan.  Marca Ancona 01/15/2013

## 2013-01-15 NOTE — Progress Notes (Signed)
[address] [place], [place]  [address] Phone: [phone] Fax:  [phone]  Date:  01/15/2013   ID:  DAVE MERGEN, DOB 09-09-1929, MRN [id number]  PCP:  Kaleen Mask, MD  Cardiologist:  Dr. Delane Ginger     History of Present Illness: Alex Orozco is a 79 y.o. male who returns for followup after recent admission to the hospital 7/25-8/1 with severe epistaxis requiring mechanical ventilation for airway protection c/b blood loss anemia, a/c diastolic CHF and deconditioning.  He has a hx of CAD, s/p CABG, s/p NSTEMI 9/10 treated with a DES to the SVG-D3, restrictive lung disease, CKD, diastolic CHF, HTN, HL, moderate AS, AFib, OSA, anemia and thrombocytopenia. He was admitted 08/2011 with CHF in the setting of community-acquired pneumonia.  Echo 08/25/11: Mild LVH, EF 55-65%, grade 3 diastolic dysfunction, mild AS (Mean gradient 19), mild AI, Mild BAE.   He presented to the hospital for right maxillary and facial artery branch embolization by interventional radiology for severe uncontrolled epistaxis that had failed outpatient treatment. He was placed on the ventilator for airway protection. Antiplatelets and Coumadin were held. Cardiology saw the patient. His aspirin was eventually resumed. It was felt that he should remain off of Plavix. It was felt that he would eventually restart Coumadin when cleared by ENT as an outpatient. Patient did have some difficulty with volume overload requiring IV Lasix.  Since d/c, he has called in with c/o's worsening dyspnea.  He was changed from Lasix to Demadex 20 bid.  He has not had much improvement.  He notes NYHA III-IV symptoms.  Sleeps on 2 pillows.  No PND.  No chest pain.  No syncope.  Weights are up 17 pounds since d/c.  Of note, he has seen ENT and has been told he can resume coumadin.    Labs (4/13):   K 3.8, creatinine 1.84, CE's neg x 3, Hgb 10.8, PLT 146; CXR with mild CHF.  Labs (8/13):  HDL 38.3, LDL 27 Labs  (7/14):  Cr 2.59, ALT 13, Hgb 11.0 Labs (8/14):  K 4.3, Cr 2.8, Hgb 8.1 (nadir 7.6)   Wt Readings from Last 3 Encounters:  01/15/13 256 lb (116.121 kg)  12/16/12 239 lb 13.8 oz (108.8 kg)  12/16/12 239 lb 13.8 oz (108.8 kg)     Past Medical History  Diagnosis Date  . Coronary artery disease     a.  s/p CABG;  b. NSTEMI 9/10:  LHC with S-RCA ok, S-D1 occluded (chronic), S-D3 occluded, L-LAD ok;  PCI:  Endeavour DES to S-D3;  c. 10/2011 Lexiscan MV EF 37%, medium sized fixed defect over basilar to mid lateral wall.  No ishcemia.   . Lung disease, restrictive   . History of PFTs 01/20/2009  . Pleural effusion   . Chronic renal insufficiency   . CHF (congestive heart failure)     a. Previous EF 40-50%;  b. Echo 08/2011: mild LVH, EF 55-65, grade 3 diast dysfxn, mild AS (mean 19), mild AI, mild BAE  . Hypertension   . Dyslipidemia   . Aortic stenosis     a. echo 4/13: mild AS with mean 19 mmHg  . Atrial fibrillation     a. Chronic coumadin  . Anemia   . Difficult intubation   . Shortness of breath   . Arthritis   . S/P CABG x 5 1990    Past Surgical History  Procedure Laterality Date  . Coronary artery bypass graft  1999  .  Coronary angioplasty with stent placement  2010    DES to the SVG-D3 (100%->0%). LAD 100%, CFX 90%, RCA 100%; LIMA-LAD OK, SVG-D1 100%, SVG-RCA OK  . Back surgery    . Hernia repair    . Dccv    . Cataract extraction      bilateral  . Radiology with anesthesia N/A 12/13/2012    Procedure: RADIOLOGY WITH ANESTHESIA;  Surgeon: Oneal Grout, MD;  Location: [facility] OR;  Service: Radiology;  Laterality: N/A;      Current Outpatient Prescriptions  Medication Sig Dispense Refill  . amLODipine (NORVASC) 2.5 MG tablet Take 1 tablet (2.5 mg total) by mouth daily.  90 tablet  3  . aspirin 81 MG tablet Take 81 mg by mouth daily.        . Cholecalciferol (VITAMIN D-3) 5000 UNITS TABS Take 1 tablet by mouth daily.       . colchicine 0.6 MG tablet Take 0.6 mg by mouth  daily as needed. For gout      . doxazosin (CARDURA) 4 MG tablet Take 1 tablet (4 mg total) by mouth at bedtime.  30 tablet  5  . famotidine (PEPCID) 10 MG tablet Take 10 mg by mouth every morning. For acid      . febuxostat (ULORIC) 40 MG tablet Take 40 mg by mouth daily as needed. For gout      . feeding supplement (RESOURCE BREEZE) LIQD Take 1 Container by mouth 3 (three) times daily between meals.    0  . Ferrous Gluconate (IRON) 240 (27 FE) MG TABS Take 1 tablet by mouth daily.      . fish oil-omega-3 fatty acids 1000 MG capsule Take 1 g by mouth daily.       . hydroxychloroquine (PLAQUENIL) 200 MG tablet Take 200 mg by mouth 2 (two) times daily.       Marland Kitchen levalbuterol (XOPENEX) 0.63 MG/3ML nebulizer solution Take 3 mLs (0.63 mg total) by nebulization every 4 (four) hours as needed for wheezing.  3 mL  12  . metoprolol tartrate (LOPRESSOR) 25 MG tablet Take 3 tablets (75 mg total) by mouth 2 (two) times daily.      . potassium chloride SA (K-DUR,KLOR-CON) 20 MEQ tablet Take 2 tablets (40 mEq total) by mouth daily.  180 tablet  3  . rosuvastatin (CRESTOR) 10 MG tablet Take 10 mg by mouth every evening.      . torsemide (DEMADEX) 20 MG tablet Take 1 tablet (20 mg total) by mouth 2 (two) times daily.  60 tablet  6   No current facility-administered medications for this visit.    Allergies:   No Known Allergies  Social History:  The patient  reports that he has never smoked. He has never used smokeless tobacco. He reports that he does not drink alcohol or use illicit drugs.   Family History:  The patient's family history includes Heart attack in his father; Heart failure in his mother; Hypertension in his father.   ROS:  Please see the history of present illness.   He has a cough with scant clear sputum.   All other systems reviewed and negative.   PHYSICAL EXAM: VS:  BP 160/78  Pulse 83  Ht 5\' 6"  (1.676 m)  Wt 256 lb (116.121 kg)  BMI 41.34 kg/m2 Well nourished, well developed, in no  acute distress HEENT: normal aside from periorbital edema Neck: + JVD at 90 degrees Cardiac:  normal S1, S2; irreg irreg rhythm; no murmur  Lungs:  Coarse breath sounds at the bases bilaterally, no wheezing or rhonchi   Abd:  distended Ext: 1+ bilateral LE edema (compression stockings in place) Skin: warm and dry Neuro:  CNs 2-12 intact, no focal abnormalities noted  EKG:  Atrial fibrillation, HR 83, normal axis, nonspecific ST-T wave changes     ASSESSMENT AND PLAN:  1. Acute on Chronic Diastolic CHF:  He is significantly volume overloaded.  He is NYHA Class III-IV.  He has stage III-IV CKD.  I do not think he would have much benefit from continued attempts at OP diuresis.  I have recommended admitting him to the hospital for IV diuresis.  I d/w Dr. Marca Ancona (DOD) who agreed.  I will place him Lasix 80 IV bid.  Monitor renal function closely.   2. Atrial Fibrillation:  He has been cleared to resume coumadin.  I will request notes from Dr. Annalee Genta.  I will place him on IV heparin (easier to turn off if he has recurrent bleeding) and coumadin.  D/c heparin once INR 2.   3. Epistaxis:  No recurrence.  F/u with ENT as planned. 4. CAD:  No angina.  He is on ASA.  He will not resume Plavix.  Continue statin.  5. CKD:  Keep close eye on renal fxn with IV diuresis.   6. Anemia:  Check f/u CBC in the hospital.   7. Disposition:  Admit to [facility] today.   Signed, Tereso Newcomer, PA-C  01/15/2013 10:18 AM

## 2013-01-15 NOTE — Progress Notes (Signed)
ANTICOAGULATION CONSULT NOTE - Follow Up Consult  Pharmacy Consult for heparin and Coumadin Indication: atrial fibrillation  No Known Allergies  Patient Measurements: Height: 5\' 7"  (170.2 cm) Weight: 252 lb 9.6 oz (114.579 kg) IBW/kg (Calculated) : 66.1 Heparin Dosing Weight: 92.2 kg  Vital Signs: Temp: 98.8 F (37.1 C) (08/27 2121) Temp src: Oral (08/27 2121) BP: 154/81 mmHg (08/27 2121) Pulse Rate: 81 (08/27 2121)  Labs:  Recent Labs  01/13/13 1110 01/15/13 1235 01/15/13 2230  HGB  --  10.2*  --   HCT  --  32.4*  --   PLT  --  113*  --   APTT  --  35  --   LABPROT  --  14.8  --   INR  --  1.19  --   HEPARINUNFRC  --   --  0.25*  CREATININE 2.8* 2.88*  --     Estimated Creatinine Clearance: 24.7 ml/min (by C-G formula based on Cr of 2.88).   Medications:  Prescriptions prior to admission  Medication Sig Dispense Refill  . amLODipine (NORVASC) 2.5 MG tablet Take 1 tablet (2.5 mg total) by mouth daily.  90 tablet  3  . aspirin 81 MG tablet Take 81 mg by mouth daily.        . Cholecalciferol (VITAMIN D-3) 5000 UNITS TABS Take 1 tablet by mouth daily.       . colchicine 0.6 MG tablet Take 0.6 mg by mouth daily as needed. For gout      . doxazosin (CARDURA) 4 MG tablet Take 1 tablet (4 mg total) by mouth at bedtime.  90 tablet  3  . famotidine (PEPCID) 10 MG tablet Take 10 mg by mouth every morning. For acid      . febuxostat (ULORIC) 40 MG tablet Take 40 mg by mouth daily as needed. For gout      . feeding supplement (RESOURCE BREEZE) LIQD Take 1 Container by mouth 3 (three) times daily between meals.    0  . Ferrous Gluconate (IRON) 240 (27 FE) MG TABS Take 1 tablet by mouth daily.      . fish oil-omega-3 fatty acids 1000 MG capsule Take 1 g by mouth daily.       . hydroxychloroquine (PLAQUENIL) 200 MG tablet Take 200 mg by mouth 2 (two) times daily.       Marland Kitchen levalbuterol (XOPENEX) 0.63 MG/3ML nebulizer solution Take 3 mLs (0.63 mg total) by nebulization every 4  (four) hours as needed for wheezing.  3 mL  12  . metoprolol tartrate (LOPRESSOR) 25 MG tablet Take 3 tablets (75 mg total) by mouth 2 (two) times daily.      . potassium chloride SA (K-DUR,KLOR-CON) 20 MEQ tablet Take 2 tablets (40 mEq total) by mouth daily.  180 tablet  3  . rosuvastatin (CRESTOR) 10 MG tablet Take 10 mg by mouth every evening.      . torsemide (DEMADEX) 20 MG tablet Take 1 tablet (20 mg total) by mouth 2 (two) times daily.  60 tablet  6    Assessment: 79 y/o male admitted from cardiologist office today for worsening dyspnea and volume overload to begin IV diuresis inpatient. Of note, patient had a recent admission  (7/25-8/1) for severe epistaxis requiring mechanical ventilation for airway protection and is s/p right maxillary and facial artery branch embolization. He did take Coumadin for Afib prior to the admission for epistaxis and this has been on hold until now. ENT has cleared patient to  resume Coumadin. Pharmacy consulted to begin a heparin drip until INR >2 and resume Coumadin. Baseline labs are pending. Will give  smaller bolus with recent bleeding event.  1st heparin level 0.25 after bolus and rate at 1300 units/hr. No bleeding noted.   Goal of Therapy:  INR 2-3 Heparin level 0.3-0.7 units/ml Monitor platelets by anticoagulation protocol: Yes   Plan:  -increase to 1500 units/hr and recheck HL in 8 hours.    Janice Coffin

## 2013-01-15 NOTE — Progress Notes (Addendum)
ANTICOAGULATION CONSULT NOTE - Follow Up Consult  Pharmacy Consult for heparin and Coumadin Indication: atrial fibrillation  No Known Allergies  Patient Measurements: Height: 5\' 7"  (170.2 cm) Weight: 252 lb 9.6 oz (114.579 kg) IBW/kg (Calculated) : 66.1 Heparin Dosing Weight: 92.2 kg  Vital Signs: Temp: 98.1 F (36.7 C) (08/27 1137) Temp src: Oral (08/27 1137) BP: 152/80 mmHg (08/27 1137) Pulse Rate: 87 (08/27 1137)  Labs:  Recent Labs  01/13/13 1110  CREATININE 2.8*    Estimated Creatinine Clearance: 25.4 ml/min (by C-G formula based on Cr of 2.8).   Medications:  Prescriptions prior to admission  Medication Sig Dispense Refill  . amLODipine (NORVASC) 2.5 MG tablet Take 1 tablet (2.5 mg total) by mouth daily.  90 tablet  3  . aspirin 81 MG tablet Take 81 mg by mouth daily.        . Cholecalciferol (VITAMIN D-3) 5000 UNITS TABS Take 1 tablet by mouth daily.       . colchicine 0.6 MG tablet Take 0.6 mg by mouth daily as needed. For gout      . doxazosin (CARDURA) 4 MG tablet Take 1 tablet (4 mg total) by mouth at bedtime.  90 tablet  3  . famotidine (PEPCID) 10 MG tablet Take 10 mg by mouth every morning. For acid      . febuxostat (ULORIC) 40 MG tablet Take 40 mg by mouth daily as needed. For gout      . feeding supplement (RESOURCE BREEZE) LIQD Take 1 Container by mouth 3 (three) times daily between meals.    0  . Ferrous Gluconate (IRON) 240 (27 FE) MG TABS Take 1 tablet by mouth daily.      . fish oil-omega-3 fatty acids 1000 MG capsule Take 1 g by mouth daily.       . hydroxychloroquine (PLAQUENIL) 200 MG tablet Take 200 mg by mouth 2 (two) times daily.       Marland Kitchen levalbuterol (XOPENEX) 0.63 MG/3ML nebulizer solution Take 3 mLs (0.63 mg total) by nebulization every 4 (four) hours as needed for wheezing.  3 mL  12  . metoprolol tartrate (LOPRESSOR) 25 MG tablet Take 3 tablets (75 mg total) by mouth 2 (two) times daily.      . potassium chloride SA (K-DUR,KLOR-CON) 20  MEQ tablet Take 2 tablets (40 mEq total) by mouth daily.  180 tablet  3  . rosuvastatin (CRESTOR) 10 MG tablet Take 10 mg by mouth every evening.      . torsemide (DEMADEX) 20 MG tablet Take 1 tablet (20 mg total) by mouth 2 (two) times daily.  60 tablet  6    Assessment: 79 y/o male admitted from cardiologist office today for worsening dyspnea and volume overload to begin IV diuresis inpatient. Of note, patient had a recent admission  (7/25-8/1) for severe epistaxis requiring mechanical ventilation for airway protection and is s/p right maxillary and facial artery branch embolization. He did take Coumadin for Afib prior to the admission for epistaxis and this has been on hold until now. ENT has cleared patient to resume Coumadin. Pharmacy consulted to begin a heparin drip until INR >2 and resume Coumadin. Baseline labs are pending. Will give smaller bolus with recent bleeding event.  Spoke with wife and confirmed patient has had no Coumadin since last admission. He also has had no bleeding issues. Home Coumadin regimen was 2.5 mg daily except 5 mg Mon/Fri.  Goal of Therapy:  INR 2-3 Heparin level 0.3-0.7 units/ml Monitor  platelets by anticoagulation protocol: Yes   Plan:  - Heparin 2800 unit IV bolus then infuse at 1300 units/hr - 8 hr heparin level - Coumadin 5 mg PO tonight - Daily heparin level, INR and CBC - Monitor for signs/symptoms of bleeding - F/U baseline labs  [facility], [place].D., BCPS Clinical Pharmacist Pager: [phone] 01/15/2013 1:05 PM   Addendum: H/H 10.2/32.4 pltc 113 (hx of thrombocytopenia) INR 1.19  Continue with above plan.  [place], [address].D., BCPS Clinical Pharmacist Pager: [phone] 01/15/2013 2:14 PM

## 2013-01-16 ENCOUNTER — Inpatient Hospital Stay (HOSPITAL_COMMUNITY): Payer: Medicare Other

## 2013-01-16 DIAGNOSIS — I5033 Acute on chronic diastolic (congestive) heart failure: Principal | ICD-10-CM

## 2013-01-16 DIAGNOSIS — I359 Nonrheumatic aortic valve disorder, unspecified: Secondary | ICD-10-CM

## 2013-01-16 DIAGNOSIS — I509 Heart failure, unspecified: Secondary | ICD-10-CM

## 2013-01-16 DIAGNOSIS — I4891 Unspecified atrial fibrillation: Secondary | ICD-10-CM

## 2013-01-16 DIAGNOSIS — N189 Chronic kidney disease, unspecified: Secondary | ICD-10-CM

## 2013-01-16 LAB — BASIC METABOLIC PANEL
BUN: 36 mg/dL — ABNORMAL HIGH (ref 6–23)
CO2: 29 mEq/L (ref 19–32)
Chloride: 103 mEq/L (ref 96–112)
Creatinine, Ser: 2.72 mg/dL — ABNORMAL HIGH (ref 0.50–1.35)
GFR calc Af Amer: 24 mL/min — ABNORMAL LOW (ref >90)
Potassium: 3.9 mEq/L (ref 3.5–5.1)

## 2013-01-16 LAB — CBC
Hemoglobin: 9.5 g/dL — ABNORMAL LOW (ref 13.0–17.0)
MCH: 29.4 pg (ref 26.0–34.0)
MCV: 92.9 fL (ref 78.0–100.0)
Platelets: 109 10*3/uL — ABNORMAL LOW (ref 150–400)
RBC: 3.23 MIL/uL — ABNORMAL LOW (ref 4.22–5.81)
WBC: 6.4 10*3/uL (ref 4.0–10.5)

## 2013-01-16 LAB — PROTIME-INR: Prothrombin Time: 15.7 seconds — ABNORMAL HIGH (ref 11.6–15.2)

## 2013-01-16 MED ORDER — WARFARIN SODIUM 5 MG PO TABS
5.0000 mg | ORAL_TABLET | Freq: Once | ORAL | Status: AC
  Administered 2013-01-16: 5 mg via ORAL
  Filled 2013-01-16: qty 1

## 2013-01-16 NOTE — Progress Notes (Signed)
Utilization Review Completed Kailin Leu J. Faithe Ariola, RN, BSN, NCM [phone]  

## 2013-01-16 NOTE — Plan of Care (Signed)
Problem: Phase I Progression Outcomes Goal: EF % per last Echo/documented,Core Reminder form on chart Outcome: Completed/Met Date Met:  01/16/13 ECHO with contrast 01/16/13  25 to 30 %

## 2013-01-16 NOTE — Progress Notes (Signed)
ANTICOAGULATION CONSULT NOTE - Follow Up Consult  Pharmacy Consult for Coumadin Indication: atrial fibrillation  No Known Allergies  Patient Measurements: Height: 5\' 7"  (170.2 cm) Weight: 251 lb 9.6 oz (114.125 kg) (scale b) IBW/kg (Calculated) : 66.1 Heparin Dosing Weight: 92.2 kg  Vital Signs: Temp: 98.1 F (36.7 C) (08/28 0539) Temp src: Oral (08/28 0539) BP: 150/72 mmHg (08/28 0904) Pulse Rate: 72 (08/28 0905)  Labs:  Recent Labs  01/13/13 1110 01/15/13 1235 01/15/13 2230 01/16/13 0510  HGB  --  10.2*  --  9.5*  HCT  --  32.4*  --  30.0*  PLT  --  113*  --  109*  APTT  --  35  --   --   LABPROT  --  14.8  --  15.7*  INR  --  1.19  --  1.28  HEPARINUNFRC  --   --  0.25*  --   CREATININE 2.8* 2.88*  --  2.72*    Estimated Creatinine Clearance: 26.1 ml/min (by C-G formula based on Cr of 2.72).  Assessment: 79 y/o male admitted from cardiologist office for worsening dyspnea and volume overload to begin IV diuresis inpatient. Of note, patient had a recent admission  (7/25-8/1) for severe epistaxis requiring mechanical ventilation for airway protection and is s/p right maxillary and facial artery branch embolization. He did take Coumadin for Afib prior to the admission for epistaxis and this has been on hold until now. ENT has cleared patient to resume Coumadin.  Heparin was stopped d/t recent bleeding. INR remains subtherapeutic at 1.28. No further bleeding noted. Home Coumadin regimen was 2.5 mg daily except 5 mg Mon/Fri.  Goal of Therapy:  INR 2-3   Plan:  1. Repeat coumadin 5mg  PO x 1 tonight 2. F/u AM INR  Lysle Pearl, PharmD, BCPS Pager # [phone] 01/16/2013 9:33 AM

## 2013-01-16 NOTE — Progress Notes (Signed)
Reviewing heart failure teaching book with pt and wife. Pt has little interest - trying to watch TV. Wife does all the cooking. Reviewed pt's diet. Most of the meals sound fairly low in sodium. Pt stated he snacks on cheese doodles. Asked pt to think of alternative snack that is low in sodium and we would talk about it later. Referred back to heart failure booklet and wife given more material with exit care notes on low sodium diets.

## 2013-01-16 NOTE — Progress Notes (Signed)
Echo Lab  2D Echocardiogram completed.  Diarra Kos L Jaiana Sheffer, RDCS 01/16/2013 10:07 AM

## 2013-01-16 NOTE — Progress Notes (Signed)
Patient ID: Alex Orozco, male   DOB: 1930-03-03, 79 y.o.   MRN: [id number]    SUBJECTIVE: 79 yo admitted yesterday from clinic with acute on chronic presumed diastolic CHF.  He diuresed well last night.  Creatinine stable.  He still feels dyspneic with activity.   Marland Kitchen amLODipine  2.5 mg Oral Daily  . aspirin EC  81 mg Oral Daily  . atorvastatin  20 mg Oral q1800  . doxazosin  4 mg Oral QHS  . famotidine  10 mg Oral q morning - 10a  . ferrous sulfate  325 mg Oral Q breakfast  . furosemide  80 mg Intravenous BID  . hydroxychloroquine  200 mg Oral BID  . metoprolol tartrate  75 mg Oral BID  . potassium chloride SA  40 mEq Oral Daily  . sodium chloride  3 mL Intravenous Q12H  . Warfarin - Pharmacist Dosing Inpatient   Does not apply q1800      Filed Vitals:   01/15/13 1849 01/15/13 2121 01/16/13 0114 01/16/13 0539  BP: 152/90 154/81 152/76 149/70  Pulse: 85 81 76 69  Temp: 97.5 F (36.4 C) 98.8 F (37.1 C) 98.5 F (36.9 C) 98.1 F (36.7 C)  TempSrc: Oral Oral Oral Oral  Resp: 18 18 18 18   Height:      Weight:    114.125 kg (251 lb 9.6 oz)  SpO2: 95% 95% 97% 95%    Intake/Output Summary (Last 24 hours) at 01/16/13 0807 Last data filed at 01/16/13 0708  Gross per 24 hour  Intake 1037.25 ml  Output   2775 ml  Net -1737.75 ml    LABS: Basic Metabolic Panel:  Recent Labs  40/98/11 1235 01/16/13 0510  NA 143 141  K 4.5 3.9  CL 104 103  CO2 28 29  GLUCOSE 105* 96  BUN 36* 36*  CREATININE 2.88* 2.72*  CALCIUM 9.3 9.0   Liver Function Tests:  Recent Labs  01/15/13 1235  AST 16  ALT 11  ALKPHOS 49  BILITOT 0.6  PROT 6.3  ALBUMIN 3.5   No results found for this basename: LIPASE, AMYLASE,  in the last 72 hours CBC:  Recent Labs  01/15/13 1235 01/16/13 0510  WBC 7.8 6.4  NEUTROABS 6.5  --   HGB 10.2* 9.5*  HCT 32.4* 30.0*  MCV 92.8 92.9  PLT 113* 109*   Cardiac Enzymes: No results found for this basename: CKTOTAL, CKMB, CKMBINDEX, TROPONINI,  in  the last 72 hours BNP: No components found with this basename: POCBNP,  D-Dimer: No results found for this basename: DDIMER,  in the last 72 hours Hemoglobin A1C: No results found for this basename: HGBA1C,  in the last 72 hours Fasting Lipid Panel: No results found for this basename: CHOL, HDL, LDLCALC, TRIG, CHOLHDL, LDLDIRECT,  in the last 72 hours Thyroid Function Tests: No results found for this basename: TSH, T4TOTAL, FREET3, T3FREE, THYROIDAB,  in the last 72 hours Anemia Panel: No results found for this basename: VITAMINB12, FOLATE, FERRITIN, TIBC, IRON, RETICCTPCT,  in the last 72 hours  RADIOLOGY: Dg Chest 2 View  12/19/2012   *RADIOLOGY REPORT*  Clinical Data: Cough, CHF.  CHEST - 2 VIEW  Comparison: 12/18/2012  Findings: Prior CABG.  Cardiomegaly.  No overt edema.  No focal opacities or effusions.  No acute bony abnormality.  Degenerative changes in the shoulders bilaterally.  IMPRESSION: Cardiomegaly with vascular congestion.   Original Report Authenticated By: Charlett Nose, M.D.   Dg Chest 2 View  12/18/2012   *RADIOLOGY REPORT*  Clinical Data: chf, wheezing  CHEST - 2 VIEW  Comparison: 12/16/12  Findings: Moderate cardiac enlargement.  Status post CABG.  Mild to moderate central vascular congestion.  Mild peribronchial cuffings. Mild lingular interstitial change.  Mild right lower lobe posterior infiltrate.  IMPRESSION: Vascular congestion with mild lingular interstitial and right lower lobe hazy opacities, possibly representing early developing pulmonary edema.  Particularly regarding the right lower lobe, pneumonia not excluded.   Original Report Authenticated By: Esperanza Heir, M.D.    PHYSICAL EXAM General: NAD Neck: JVP 12 cm, no thyromegaly or thyroid nodule.  Lungs: Slight crackles at bases bilaterally CV: Nondisplaced PMI.  Heart regular S1/S2, no S3/S4, 2/6 mid-peaking systolic murmur RUSB with clearly heard S2.  2+ edema to knees bilaterally.  No carotid bruit.     Abdomen: Soft, nontender, no hepatosplenomegaly, no distention.  Neurologic: Alert and oriented x 3.  Psych: Normal affect. Extremities: No clubbing or cyanosis.   TELEMETRY: Reviewed telemetry pt in atrial fibrillation  ASSESSMENT AND PLAN: 79 yo with history of CAD s/p CABG, diastolic CHF, restrictive lung disease, moderate AS, CKD, and chronic atrial fibrillation was admitted yesterday with acute on chronic diastolic CHF exacerbation.  Of note, he was in the hospital recently for a severe nose bleed that had to be cauterized.  1. CHF: Acute on chronic presumed diastolic CHF.  He diuresed well yesterday.  He is still volume overloaded on exam and short of breath.   - Echo to reassess LV systolic function.  - Continue Lasix 80 mg IV bid for now.  2. CKD: Creatinine lower this morning than yesterday.  Will need to follow closely with diuresis.  Hopefully lowering of renal venous pressure with diuresis will allow [place] to avoid significant worsening of renal function.  3. Atrial fibrillation: Chronic.  He has been cleared by ENT to restart coumadin.  Would not overlap heparin with coumadin given recent severe nose bleed so stopped heparin gtt.  Rate control adequate on current metoprolol regimen.  4. Aortic stenosis: Does not sound severe on exam but will reassess on echo.  5. CAD: s/p CABG.  No chest pain.    Marca Ancona 01/16/2013

## 2013-01-16 NOTE — Progress Notes (Signed)
Pt diuresing well with IV lasix. Continue to educate pt and wife on Heart Failure education with emphasis on diet and daily weights. Consult to Dietician today. Ambulation in hallway tolerated well with 1 assist , walker. 02 sat remained at 96 % on RA.

## 2013-01-16 NOTE — Care Management Note (Signed)
    Page 1 of 1   01/16/2013     11:58:57 AM   CARE MANAGEMENT NOTE 01/16/2013  Patient:  Alex Orozco, Alex Orozco   Account Number:  [id number]  Date Initiated:  01/16/2013  Documentation initiated by:  [facility]  Subjective/Objective Assessment:   79 y.o. male who returns for followup after recent admission  7/25-8/1 severe epistaxis requiring mechanical ventilation for airway protection c/b blood loss anemia, a/c diastolic CHF and deconditioning.//home with spouse     Action/Plan:   diurese//home with HH   Anticipated DC Date:  01/18/2013   Anticipated DC Plan:  HOME W HOME HEALTH SERVICES      DC Planning Services  CM consult      Choice offered to / List presented to:  C-1 Patient        HH arranged  HH - 11 Patient Refused      Status of service:   Medicare Important Message given?   (If response is "NO", the following Medicare IM given date fields will be blank) Date Medicare IM given:   Date Additional Medicare IM given:    Discharge Disposition:    Per UR Regulation:    If discussed at Long Length of Stay Meetings, dates discussed:    Comments:  01/16/13 1135  Oletta Cohn, RN, BSN, NCM [phone] Spoke with pt regarding discharge planning for Home Health services.  Pt states that he has been dealing with this disease for years and decliines HH sevices at this time. No DME needs identified at this time.

## 2013-01-16 NOTE — Plan of Care (Signed)
Problem: Self-Monitoring Deficit (NB-1.4) Goal: Nutrition education Formal process to instruct or train a patient/client in a skill or to impart knowledge to help patients/clients voluntarily manage or modify food choices and eating behavior to maintain or improve health. Outcome: Completed/Met Date Met:  01/16/13   Nutrition Education Note  RD consulted for nutrition education regarding CHF. Pt reports that he is VERY compliant at home, despite RD's nursing consult stating that pt snacks on "cheese doodles" frequently at home. Pt states that they do not have a salt shaker in his home and that his wife does all of the cooking. He states that following a low-sodium diet is not new for him. He reports that he usually gains weight after leaving the hospital because he is typically not given a sodium-restricted diet while admitted, but he does note that "they are doing things right this time, I'm not getting any salt."   RD provided "Low Sodium Nutrition Therapy" handout from the [employer] of [employer]. Reviewed patient's dietary recall. Provided examples on ways to decrease sodium intake in diet. Discouraged intake of processed foods and use of salt shaker. Encouraged fresh fruits and vegetables as well as whole grain sources of carbohydrates to maximize fiber intake.   RD discussed why it is important for patient to adhere to diet recommendations, and emphasized the role of fluids, foods to avoid, and importance of weighing self daily. Teach back method used.  Expect poor compliance.  Body mass index is 39.4 kg/(m^2). Pt meets criteria for Obese Class II based on current BMI.  Current diet order is Heart Healthy, patient is consuming approximately 75% of meals at this time. Labs and medications reviewed. No further nutrition interventions warranted at this time. RD contact information provided. If additional nutrition issues arise, please re-consult RD.   Alex Motto MS, RD,  LDN Pager: [phone] After-hours pager: [phone]

## 2013-01-17 DIAGNOSIS — Z951 Presence of aortocoronary bypass graft: Secondary | ICD-10-CM

## 2013-01-17 DIAGNOSIS — R0602 Shortness of breath: Secondary | ICD-10-CM

## 2013-01-17 DIAGNOSIS — I5032 Chronic diastolic (congestive) heart failure: Secondary | ICD-10-CM

## 2013-01-17 DIAGNOSIS — I2510 Atherosclerotic heart disease of native coronary artery without angina pectoris: Secondary | ICD-10-CM

## 2013-01-17 LAB — CBC
HCT: 31.1 % — ABNORMAL LOW (ref 39.0–52.0)
Hemoglobin: 9.8 g/dL — ABNORMAL LOW (ref 13.0–17.0)
MCH: 29.2 pg (ref 26.0–34.0)
MCHC: 31.5 g/dL (ref 30.0–36.0)
MCV: 92.6 fL (ref 78.0–100.0)
Platelets: 98 10*3/uL — ABNORMAL LOW (ref 150–400)
RBC: 3.36 MIL/uL — ABNORMAL LOW (ref 4.22–5.81)
RDW: 16 % — ABNORMAL HIGH (ref 11.5–15.5)
WBC: 6.5 10*3/uL (ref 4.0–10.5)

## 2013-01-17 LAB — BASIC METABOLIC PANEL WITH GFR
BUN: 35 mg/dL — ABNORMAL HIGH (ref 6–23)
CO2: 25 meq/L (ref 19–32)
Calcium: 9.1 mg/dL (ref 8.4–10.5)
Chloride: 102 meq/L (ref 96–112)
Creatinine, Ser: 2.78 mg/dL — ABNORMAL HIGH (ref 0.50–1.35)
GFR calc Af Amer: 23 mL/min — ABNORMAL LOW
GFR calc non Af Amer: 20 mL/min — ABNORMAL LOW
Glucose, Bld: 93 mg/dL (ref 70–99)
Potassium: 3.9 meq/L (ref 3.5–5.1)
Sodium: 143 meq/L (ref 135–145)

## 2013-01-17 LAB — PROTIME-INR
INR: 1.22 (ref 0.00–1.49)
Prothrombin Time: 15.1 seconds (ref 11.6–15.2)

## 2013-01-17 MED ORDER — WARFARIN SODIUM 7.5 MG PO TABS
7.5000 mg | ORAL_TABLET | Freq: Once | ORAL | Status: AC
  Administered 2013-01-17: 7.5 mg via ORAL
  Filled 2013-01-17: qty 1

## 2013-01-17 NOTE — Progress Notes (Signed)
Patient not eligible for [facility] Care Management services as he does not have a [facility] Primary MD. Made inpatient RNCM aware.  Alex Noble, MSN-Ed, RN,BSN- [facility] [phone]

## 2013-01-17 NOTE — Progress Notes (Signed)
ANTICOAGULATION CONSULT NOTE - Follow Up Consult  Pharmacy Consult for Coumadin Indication: atrial fibrillation  No Known Allergies  Patient Measurements: Height: 5\' 7"  (170.2 cm) Weight: 247 lb 11.2 oz (112.356 kg) (scale B) IBW/kg (Calculated) : 66.1 Heparin Dosing Weight: 92.2 kg  Vital Signs: Temp: 98.4 F (36.9 C) (08/29 0502) Temp src: Oral (08/29 0502) BP: 145/82 mmHg (08/29 0502) Pulse Rate: 78 (08/29 0502)  Labs:  Recent Labs  01/15/13 1235 01/15/13 2230 01/16/13 0510 01/17/13 0530  HGB 10.2*  --  9.5* 9.8*  HCT 32.4*  --  30.0* 31.1*  PLT 113*  --  109* 98*  APTT 35  --   --   --   LABPROT 14.8  --  15.7* 15.1  INR 1.19  --  1.28 1.22  HEPARINUNFRC  --  0.25*  --   --   CREATININE 2.88*  --  2.72* 2.78*    Estimated Creatinine Clearance: 25.4 ml/min (by C-G formula based on Cr of 2.78).  79 y/o male admitted 01/15/2013  for worsening dyspnea and volume overload to begin IV diuresis inpatient.   PMH: CAD s/p CABG, CKD, CHF, HTN, HLD, AS, afib, anemia, OA, patient had a recent admission (7/25-8/1) for severe epistaxis requiring mechanical ventilation for airway protection and is s/p right maxillary and facial artery branch embolization.  Events: Ongoing diuresis.   AC: Afib - INR remains below goal;  heparin gtt stopped d/t recent severe epistaxis, H/H 9.5/30, plts 109, no bleeding now (Home Coumadin regimen was 2.5 mg daily except 5 mg Mon/Fri)  ID: Afebrile, WBC WNL, no abx  CV: Hx CAD s/p CABG, HTN, HLD, AS, afib, CHF (EF 55-65% 08/2011) -- amlodipine, asa, lipitor, doxazosin, lasix 80 IV bid, metoprolol  BP above goal HR 70-80s;   1185/1870 (-685)  Endo: No hx - AM gluc ok  GI/Nutr: Cardiac diet - LFTs WNL  Neuro: WDL  Renal: Hx CKD -crcl 25, lytes ok  Pulm:96% RA  Heme/Onc: Hx anemia - plaquenil?  Best Practices: Coumadin  Plan: 1. Repeat coumadin 7.5 mg PO x 1 tonight 2. F/u AM INR   Thank you for allowing pharmacy to be a part of  this patients care team.  Lovenia Kim Pharm.D., BCPS Clinical Pharmacist 01/17/2013 8:37 AM Pager: [phone] Phone: [phone]

## 2013-01-17 NOTE — Progress Notes (Signed)
Patient ID: Alex Orozco, male   DOB: 17-Oct-1929, 79 y.o.   MRN: [id number]    SUBJECTIVE: 79 yo admitted  from clinic with acute on chronic presumed diastolic CHF.  He diuresed well last night.  Creatinine stable.  He still feels dyspneic with activity.   He has diuresed ~2.8 liters so far.  Echo showed: Study Conclusions  - Left ventricle: The cavity size was mildly dilated. Wall thickness was increased in a pattern of mild LVH. Systolic function was severely reduced. The estimated ejection fraction was in the range of 25% to 30%. Diffuse hypokinesis. Doppler parameters are consistent with high ventricular filling pressure. - Aortic valve: There was moderate to severe stenosis. Trivial regurgitation. Valve area: 0.99cm^2(VTI). Valve area: 0.84cm^2 (Vmax). - Mitral valve: Calcified annulus. Moderate regurgitation. - Left atrium: The atrium was moderately dilated. - Right ventricle: The cavity size was mildly dilated. Systolic function was mildly reduced. - Right atrium: The atrium was moderately dilated. - Pulmonary arteries: Systolic pressure was mildly increased. PA peak pressure: 40mm Hg     . amLODipine  2.5 mg Oral Daily  . aspirin EC  81 mg Oral Daily  . atorvastatin  20 mg Oral q1800  . doxazosin  4 mg Oral QHS  . famotidine  10 mg Oral q morning - 10a  . ferrous sulfate  325 mg Oral Q breakfast  . furosemide  80 mg Intravenous BID  . hydroxychloroquine  200 mg Oral BID  . metoprolol tartrate  75 mg Oral BID  . potassium chloride SA  40 mEq Oral Daily  . sodium chloride  3 mL Intravenous Q12H  . Warfarin - Pharmacist Dosing Inpatient   Does not apply q1800      Filed Vitals:   01/16/13 1318 01/16/13 1600 01/16/13 2112 01/17/13 0502  BP: 154/73  148/72 145/82  Pulse: 84  75 78  Temp: 98.6 F (37 C)  98.4 F (36.9 C) 98.4 F (36.9 C)  TempSrc: Oral  Oral Oral  Resp: 18  18 18   Height:      Weight:    247 lb 11.2 oz (112.356 kg)  SpO2: 96% 96% 97% 93%     Intake/Output Summary (Last 24 hours) at 01/17/13 0734 Last data filed at 01/17/13 0600  Gross per 24 hour  Intake   1020 ml  Output   1870 ml  Net   -850 ml    LABS: Basic Metabolic Panel:  Recent Labs  40/98/11 1235 01/16/13 0510  NA 143 141  K 4.5 3.9  CL 104 103  CO2 28 29  GLUCOSE 105* 96  BUN 36* 36*  CREATININE 2.88* 2.72*  CALCIUM 9.3 9.0   Liver Function Tests:  Recent Labs  01/15/13 1235  AST 16  ALT 11  ALKPHOS 49  BILITOT 0.6  PROT 6.3  ALBUMIN 3.5   No results found for this basename: LIPASE, AMYLASE,  in the last 72 hours CBC:  Recent Labs  01/15/13 1235 01/16/13 0510 01/17/13 0530  WBC 7.8 6.4 6.5  NEUTROABS 6.5  --   --   HGB 10.2* 9.5* 9.8*  HCT 32.4* 30.0* 31.1*  MCV 92.8 92.9 92.6  PLT 113* 109* 98*   Cardiac Enzymes: No results found for this basename: CKTOTAL, CKMB, CKMBINDEX, TROPONINI,  in the last 72 hours BNP: No components found with this basename: POCBNP,  D-Dimer: No results found for this basename: DDIMER,  in the last 72 hours Hemoglobin A1C: No results found for this  basename: HGBA1C,  in the last 72 hours Fasting Lipid Panel: No results found for this basename: CHOL, HDL, LDLCALC, TRIG, CHOLHDL, LDLDIRECT,  in the last 72 hours Thyroid Function Tests: No results found for this basename: TSH, T4TOTAL, FREET3, T3FREE, THYROIDAB,  in the last 72 hours Anemia Panel: No results found for this basename: VITAMINB12, FOLATE, FERRITIN, TIBC, IRON, RETICCTPCT,  in the last 72 hours  RADIOLOGY: Dg Chest 2 View  12/19/2012   *RADIOLOGY REPORT*  Clinical Data: Cough, CHF.  CHEST - 2 VIEW  Comparison: 12/18/2012  Findings: Prior CABG.  Cardiomegaly.  No overt edema.  No focal opacities or effusions.  No acute bony abnormality.  Degenerative changes in the shoulders bilaterally.  IMPRESSION: Cardiomegaly with vascular congestion.   Original Report Authenticated By: Charlett Nose, M.D.   Dg Chest 2 View  12/18/2012    *RADIOLOGY REPORT*  Clinical Data: chf, wheezing  CHEST - 2 VIEW  Comparison: 12/16/12  Findings: Moderate cardiac enlargement.  Status post CABG.  Mild to moderate central vascular congestion.  Mild peribronchial cuffings. Mild lingular interstitial change.  Mild right lower lobe posterior infiltrate.  IMPRESSION: Vascular congestion with mild lingular interstitial and right lower lobe hazy opacities, possibly representing early developing pulmonary edema.  Particularly regarding the right lower lobe, pneumonia not excluded.   Original Report Authenticated By: Esperanza Heir, M.D.    PHYSICAL EXAM General: NAD Neck: JVP 12 cm, no thyromegaly or thyroid nodule.  Lungs: Slight crackles at bases bilaterally CV: Nondisplaced PMI.  Heart regular S1/S2, no S3/S4, 2/6 mid-peaking systolic murmur RUSB with clearly heard S2.  2+ edema to knees bilaterally.  No carotid bruit.   Abdomen: Soft, nontender, no hepatosplenomegaly, no distention.  Neurologic: Alert and oriented x 3.  Psych: Normal affect. Extremities: 1+ edema  TELEMETRY: Reviewed telemetry pt in atrial fibrillation  ASSESSMENT AND PLAN: 79 yo with history of CAD s/p CABG, diastolic CHF, restrictive lung disease, moderate AS, CKD, and chronic atrial fibrillation was admitted with acute on chronic diastolic CHF exacerbation.  Of note, he was in the hospital recently for a severe nose bleed that had to be cauterized.  1. CHF: Acute on chronic presumed diastolic CHF.  He diuresed well yesterday.  He is still volume overloaded on exam and short of breath.    Echo shows:  - Left ventricle: The cavity size was mildly dilated. Wall thickness was increased in a pattern of mild LVH. Systolic function was severely reduced. The estimated ejection fraction was in the range of 25% to 30%. Diffuse hypokinesis. Doppler parameters are consistent with high ventricular filling pressure. - Aortic valve: There was moderate to severe stenosis. Trivial  regurgitation. Valve area: 0.99cm^2(VTI). Valve area: 0.84cm^2 (Vmax). - Mitral valve: Calcified annulus. Moderate regurgitation. - Left atrium: The atrium was moderately dilated. - Right ventricle: The cavity size was mildly dilated. Systolic function was mildly reduced. - Right atrium: The atrium was moderately dilated. - Pulmonary arteries: Systolic pressure was mildly increased. PA peak pressure: 40mm Hg   - Continue Lasix 80 mg IV bid for now.   2. CKD: Creatinine is about the same - slightly higher. .  Will need to follow closely with diuresis.  Hopefully lowering of renal venous pressure with diuresis will allow [place] to avoid significant worsening of renal function.   3. Atrial fibrillation: Chronic.  He has been cleared by ENT to restart coumadin.  Would not overlap heparin with coumadin given recent severe nose bleed so stopped heparin gtt.  Rate  control adequate on current metoprolol regimen.   4. Aortic stenosis: Does not sound severe on exam but will reassess on echo.   He would not be a very good candidate for standard open chest AVR due to his multiple comorbid conditions  but may be a candidate for TAVR .  I'm not convinced that his mod-severe AS is causing him much trouble.  I suspect his limitations are primarily due to his systolic and diastolic dysfunction.  5. CAD: s/p CABG.  No chest pain.    [name]. 01/17/2013

## 2013-01-18 DIAGNOSIS — I5023 Acute on chronic systolic (congestive) heart failure: Secondary | ICD-10-CM

## 2013-01-18 LAB — BASIC METABOLIC PANEL
BUN: 36 mg/dL — ABNORMAL HIGH (ref 6–23)
CO2: 26 mEq/L (ref 19–32)
Chloride: 102 mEq/L (ref 96–112)
GFR calc non Af Amer: 22 mL/min — ABNORMAL LOW (ref >90)
Glucose, Bld: 89 mg/dL (ref 70–99)
Potassium: 3.9 mEq/L (ref 3.5–5.1)
Sodium: 141 mEq/L (ref 135–145)

## 2013-01-18 LAB — PROTIME-INR: INR: 1.26 (ref 0.00–1.49)

## 2013-01-18 LAB — CBC
HCT: 31 % — ABNORMAL LOW (ref 39.0–52.0)
Hemoglobin: 9.8 g/dL — ABNORMAL LOW (ref 13.0–17.0)
RBC: 3.37 MIL/uL — ABNORMAL LOW (ref 4.22–5.81)
RDW: 15.9 % — ABNORMAL HIGH (ref 11.5–15.5)
WBC: 6.9 10*3/uL (ref 4.0–10.5)

## 2013-01-18 MED ORDER — CARVEDILOL 6.25 MG PO TABS
6.2500 mg | ORAL_TABLET | Freq: Two times a day (BID) | ORAL | Status: DC
  Administered 2013-01-18 – 2013-01-23 (×11): 6.25 mg via ORAL
  Filled 2013-01-18 (×12): qty 1

## 2013-01-18 MED ORDER — ISOSORBIDE MONONITRATE ER 30 MG PO TB24
30.0000 mg | ORAL_TABLET | Freq: Every day | ORAL | Status: DC
  Administered 2013-01-18 – 2013-01-23 (×6): 30 mg via ORAL
  Filled 2013-01-18 (×6): qty 1

## 2013-01-18 MED ORDER — WARFARIN SODIUM 7.5 MG PO TABS
7.5000 mg | ORAL_TABLET | Freq: Once | ORAL | Status: AC
  Administered 2013-01-18: 7.5 mg via ORAL
  Filled 2013-01-18: qty 1

## 2013-01-18 MED ORDER — HYDRALAZINE HCL 10 MG PO TABS
10.0000 mg | ORAL_TABLET | Freq: Four times a day (QID) | ORAL | Status: DC
  Administered 2013-01-18 – 2013-01-23 (×21): 10 mg via ORAL
  Filled 2013-01-18 (×25): qty 1

## 2013-01-18 NOTE — Progress Notes (Signed)
ANTICOAGULATION CONSULT NOTE - Follow Up Consult  Pharmacy Consult for Coumadin Indication: atrial fibrillation  No Known Allergies  Patient Measurements: Height: 5\' 7"  (170.2 cm) Weight: 245 lb 9.5 oz (111.4 kg) IBW/kg (Calculated) : 66.1 Heparin Dosing Weight: 92.2 kg  Vital Signs: Temp: 97.5 F (36.4 C) (08/30 0416) Temp src: Oral (08/30 0416) BP: 148/72 mmHg (08/30 0416) Pulse Rate: 67 (08/30 0416)  Labs:  Recent Labs  01/15/13 2230  01/16/13 0510 01/17/13 0530 01/18/13 0410  HGB  --   < > 9.5* 9.8* 9.8*  HCT  --   --  30.0* 31.1* 31.0*  PLT  --   --  109* 98* 127*  LABPROT  --   --  15.7* 15.1 15.5*  INR  --   --  1.28 1.22 1.26  HEPARINUNFRC 0.25*  --   --   --   --   CREATININE  --   --  2.72* 2.78* 2.57*  < > = values in this interval not displayed.  Estimated Creatinine Clearance: 27.3 ml/min (by C-G formula based on Cr of 2.57).  79 y/o male admitted 01/15/2013  for worsening dyspnea and volume overload to begin IV diuresis inpatient.   PMH: CAD s/p CABG, CKD, CHF, HTN, HLD, AS, afib, anemia, OA, patient had a recent admission (7/25-8/1) for severe epistaxis requiring mechanical ventilation for airway protection and is s/p right maxillary and facial artery branch embolization.  Events: Ongoing diuresis.   Patient on warfarin for history of Afib. INR remains below goal today, with only a slight increase from yesterday. H/H 9.8/31, plts 127, currently no bleeding noted. (Home Coumadin regimen was 2.5 mg daily except 5 mg Mon/Fri).  Will be conservative increasing dose today due to severe bleeding history and small home dose.   Plan: 1. Repeat coumadin 7.5 mg PO x 1 tonight 2. F/u AM INR   Thank you for allowing pharmacy to be a part of this patients care team.  Piedad Climes, PharmD Clinical Pharmacist - Resident Pager: [phone] Pharmacy: [phone] 01/18/2013 2:20 PM

## 2013-01-18 NOTE — Progress Notes (Signed)
   Primary cardiologist: Dr. Kristeen Miss  Subjective:   Still with leg edema. Breathing is better. No chest pain.   Objective:   Temp:  [97.5 F (36.4 C)-98.6 F (37 C)] 97.5 F (36.4 C) (08/30 0416) Pulse Rate:  [67-74] 67 (08/30 0416) Resp:  [18-20] 18 (08/30 0416) BP: (133-148)/(70-79) 148/72 mmHg (08/30 0416) SpO2:  [94 %-100 %] 94 % (08/30 0416) Weight:  [245 lb 9.5 oz (111.4 kg)] 245 lb 9.5 oz (111.4 kg) (08/30 0416) Last BM Date: 01/16/13  Filed Weights   01/16/13 0539 01/17/13 0502 01/18/13 0416  Weight: 251 lb 9.6 oz (114.125 kg) 247 lb 11.2 oz (112.356 kg) 245 lb 9.5 oz (111.4 kg)    Intake/Output Summary (Last 24 hours) at 01/18/13 1153 Last data filed at 01/18/13 1101  Gross per 24 hour  Intake    920 ml  Output   2151 ml  Net  -1231 ml   Telemetry: Atrial fibrillation with IVCD.  Exam:  General: NAD.  Lungs: Decreased at bases with crackles.  Cardiac: Irregularly irregular, no gallop.  Abdomen: NABS.  Extremities: 2-3 plus edema.   Lab Results:  Basic Metabolic Panel:  Recent Labs Lab 01/16/13 0510 01/17/13 0530 01/18/13 0410  NA 141 143 141  K 3.9 3.9 3.9  CL 103 102 102  CO2 29 25 26   GLUCOSE 96 93 89  BUN 36* 35* 36*  CREATININE 2.72* 2.78* 2.57*  CALCIUM 9.0 9.1 9.0    Liver Function Tests:  Recent Labs Lab 01/15/13 1235  AST 16  ALT 11  ALKPHOS 49  BILITOT 0.6  PROT 6.3  ALBUMIN 3.5    CBC:  Recent Labs Lab 01/16/13 0510 01/17/13 0530 01/18/13 0410  WBC 6.4 6.5 6.9  HGB 9.5* 9.8* 9.8*  HCT 30.0* 31.1* 31.0*  MCV 92.9 92.6 92.0  PLT 109* 98* 127*    BNP:  Recent Labs  12/16/12 1200 01/15/13 1235  PROBNP 4332.0* 8620.0*    Coagulation:  Recent Labs Lab 01/16/13 0510 01/17/13 0530 01/18/13 0410  INR 1.28 1.22 1.26      Medications:   Scheduled Medications: . amLODipine  2.5 mg Oral Daily  . aspirin EC  81 mg Oral Daily  . atorvastatin  20 mg Oral q1800  . doxazosin  4 mg Oral QHS  .  famotidine  10 mg Oral q morning - 10a  . ferrous sulfate  325 mg Oral Q breakfast  . furosemide  80 mg Intravenous BID  . hydroxychloroquine  200 mg Oral BID  . metoprolol tartrate  75 mg Oral BID  . potassium chloride SA  40 mEq Oral Daily  . sodium chloride  3 mL Intravenous Q12H  . Warfarin - Pharmacist Dosing Inpatient   Does not apply q1800      PRN Medications:  sodium chloride, acetaminophen, albuterol, colchicine, febuxostat, ondansetron (ZOFRAN) IV, sodium chloride   Assessment:   1. Acute on chronic systolic CHF, LVEF 25-30%. Continues to diurese.  2. CKD, stage 3-4. Creatinine down somewhat to 2.57.  3. CAF, rate controlled. Coumadin has been restarted per Pharmacy.  4. Moderate to severe AS.  5. CAD s/p CABG.   Plan/Discussion:    Continue IV Lasix with further diuresis. Renal function stable overall. Will change to Coreg from Lopressor with cardiomyopathy. No ACE or ARB with CKD. Also change from Norvasc to Hydralazine/nitrate.   Jonelle Sidle, M.D., F.A.C.C.

## 2013-01-19 LAB — CBC
Hemoglobin: 9.5 g/dL — ABNORMAL LOW (ref 13.0–17.0)
MCV: 90.8 fL (ref 78.0–100.0)
Platelets: 122 10*3/uL — ABNORMAL LOW (ref 150–400)
RBC: 3.26 MIL/uL — ABNORMAL LOW (ref 4.22–5.81)
WBC: 5.9 10*3/uL (ref 4.0–10.5)

## 2013-01-19 LAB — BASIC METABOLIC PANEL
CO2: 29 mEq/L (ref 19–32)
Calcium: 8.8 mg/dL (ref 8.4–10.5)
Potassium: 3.8 mEq/L (ref 3.5–5.1)
Sodium: 141 mEq/L (ref 135–145)

## 2013-01-19 LAB — PROTIME-INR: INR: 1.54 — ABNORMAL HIGH (ref 0.00–1.49)

## 2013-01-19 MED ORDER — WARFARIN SODIUM 5 MG PO TABS
5.0000 mg | ORAL_TABLET | Freq: Once | ORAL | Status: AC
  Administered 2013-01-19: 18:00:00 5 mg via ORAL
  Filled 2013-01-19: qty 1

## 2013-01-19 MED ORDER — FUROSEMIDE 10 MG/ML IJ SOLN
5.0000 mg/h | INTRAMUSCULAR | Status: DC
  Administered 2013-01-19: 5 mg/h via INTRAVENOUS
  Filled 2013-01-19 (×2): qty 25

## 2013-01-19 NOTE — Progress Notes (Signed)
Primary cardiologist: Dr. Kristeen Miss  Subjective:   Breathing is better, still has leg edema. No chest pain or palpitations.   Objective:   Temp:  [98.3 F (36.8 C)-98.4 F (36.9 C)] 98.4 F (36.9 C) (08/31 1015) Pulse Rate:  [64-72] 68 (08/31 1015) Resp:  [18] 18 (08/31 1015) BP: (125-140)/(59-78) 126/59 mmHg (08/31 1015) SpO2:  [96 %-100 %] 97 % (08/31 1015) Weight:  [244 lb 9.6 oz (110.95 kg)] 244 lb 9.6 oz (110.95 kg) (08/31 0444) Last BM Date: 01/18/13  Filed Weights   01/17/13 0502 01/18/13 0416 01/19/13 0444  Weight: 247 lb 11.2 oz (112.356 kg) 245 lb 9.5 oz (111.4 kg) 244 lb 9.6 oz (110.95 kg)    Intake/Output Summary (Last 24 hours) at 01/19/13 1033 Last data filed at 01/18/13 2300  Gross per 24 hour  Intake    820 ml  Output   1101 ml  Net   -281 ml   Telemetry: Atrial fibrillation with IVCD.  Exam:  General: NAD.  Lungs: Decreased at bases with crackles.  Cardiac: Irregularly irregular, no gallop.  Abdomen: NABS.  Extremities: 2-3 plus edema.   Lab Results:  Basic Metabolic Panel:  Recent Labs Lab 01/17/13 0530 01/18/13 0410 01/19/13 0408  NA 143 141 141  K 3.9 3.9 3.8  CL 102 102 101  CO2 25 26 29   GLUCOSE 93 89 98  BUN 35* 36* 37*  CREATININE 2.78* 2.57* 2.62*  CALCIUM 9.1 9.0 8.8    Liver Function Tests:  Recent Labs Lab 01/15/13 1235  AST 16  ALT 11  ALKPHOS 49  BILITOT 0.6  PROT 6.3  ALBUMIN 3.5    CBC:  Recent Labs Lab 01/17/13 0530 01/18/13 0410 01/19/13 0408  WBC 6.5 6.9 5.9  HGB 9.8* 9.8* 9.5*  HCT 31.1* 31.0* 29.6*  MCV 92.6 92.0 90.8  PLT 98* 127* 122*    BNP:  Recent Labs  12/16/12 1200 01/15/13 1235  PROBNP 4332.0* 8620.0*    Coagulation:  Recent Labs Lab 01/17/13 0530 01/18/13 0410 01/19/13 0408  INR 1.22 1.26 1.54*     Medications:   Scheduled Medications: . aspirin EC  81 mg Oral Daily  . atorvastatin  20 mg Oral q1800  . carvedilol  6.25 mg Oral BID WC  . doxazosin  4  mg Oral QHS  . famotidine  10 mg Oral q morning - 10a  . ferrous sulfate  325 mg Oral Q breakfast  . furosemide  80 mg Intravenous BID  . hydrALAZINE  10 mg Oral Q6H  . hydroxychloroquine  200 mg Oral BID  . isosorbide mononitrate  30 mg Oral Daily  . potassium chloride SA  40 mEq Oral Daily  . sodium chloride  3 mL Intravenous Q12H  . Warfarin - Pharmacist Dosing Inpatient   Does not apply q1800     PRN Medications: sodium chloride, acetaminophen, albuterol, colchicine, febuxostat, ondansetron (ZOFRAN) IV, sodium chloride   Assessment:   1. Acute on chronic systolic CHF, LVEF 25-30%. Continues to diurese, although has dropped off somewhat.  2. CKD, stage 3-4. Creatinine stable at 2.6..  3. CAF, rate controlled. Coumadin has been restarted per Pharmacy.  4. Moderate to severe AS.  5. CAD s/p CABG.   Plan/Discussion:    Will try to switch to a Lasix drip at 5 mg per hour, can increase if needed. May do better with a constant infusion. Otherwise as tolerated medication adjustments made yesterday. Difficulty will be in determining whether to pursue  further workup of his cardiomyopathy and AS. He would be at high risk for contrast nephropathy.  Jonelle Sidle, M.D., F.A.C.C.

## 2013-01-19 NOTE — Progress Notes (Signed)
ANTICOAGULATION CONSULT NOTE - Follow Up Consult  Pharmacy Consult for Coumadin Indication: atrial fibrillation  No Known Allergies  Patient Measurements: Height: 5\' 7"  (170.2 cm) Weight: 244 lb 9.6 oz (110.95 kg) (scale b) IBW/kg (Calculated) : 66.1 Heparin Dosing Weight: 92.2 kg  Vital Signs: Temp: 98.4 F (36.9 C) (08/31 1015) Temp src: Oral (08/31 1015) BP: 126/59 mmHg (08/31 1015) Pulse Rate: 68 (08/31 1015)  Labs:  Recent Labs  01/17/13 0530 01/18/13 0410 01/19/13 0408  HGB 9.8* 9.8* 9.5*  HCT 31.1* 31.0* 29.6*  PLT 98* 127* 122*  LABPROT 15.1 15.5* 18.1*  INR 1.22 1.26 1.54*  CREATININE 2.78* 2.57* 2.62*    Estimated Creatinine Clearance: 26.7 ml/min (by C-G formula based on Cr of 2.62).  Assessment: 79 y/o male admitted 01/15/2013  for worsening dyspnea and volume overload to begin IV diuresis inpatient.   Patient had a recent admission (7/25-8/1) for severe epistaxis requiring mechanical ventilation for airway protection and is s/p right maxillary and facial artery branch embolization.  Patient on Coumadin for history of Afib. INR remains below goal at 1.54 today but appears to be trending up now. H/H are low but stable, plts 122, currently no bleeding noted. Home Coumadin regimen was 2.5 mg daily except 5 mg Mon/Fri.    Plan: 1. Coumadin 5 mg PO tonight 2. F/u AM INR  [facility], [address].D., BCPS Clinical Pharmacist Pager: [phone] 01/19/2013 1:12 PM

## 2013-01-19 NOTE — Progress Notes (Signed)
Pharmacist Heart Failure Core Measure Documentation  Assessment: Alex Orozco has an EF documented as 25-30% on Echo on 01/16/13.  Rationale: Heart failure patients with left ventricular systolic dysfunction (LVSD) and an EF < 40% should be prescribed an angiotensin converting enzyme inhibitor (ACEI) or angiotensin receptor blocker (ARB) at discharge unless a contraindication is documented in the medical record.  This patient is not currently on an ACEI or ARB for HF.  This note is being placed in the record in order to provide documentation that a contraindication to the use of these agents is present for this encounter.  ACE Inhibitor or Angiotensin Receptor Blocker is contraindicated (specify all that apply)  []   ACEI allergy AND ARB allergy []   Angioedema []   Moderate or severe aortic stenosis []   Hyperkalemia []   Hypotension []   Renal artery stenosis [x]   Worsening renal function, preexisting renal disease or dysfunction   Mollie Rossano S. Merilynn Finland, PharmD, BCPS Clinical Staff Pharmacist Pager [phone]  Misty Stanley Stillinger 01/19/2013 1:24 PM

## 2013-01-20 LAB — CBC
MCV: 91.2 fL (ref 78.0–100.0)
Platelets: 132 10*3/uL — ABNORMAL LOW (ref 150–400)
RBC: 3.19 MIL/uL — ABNORMAL LOW (ref 4.22–5.81)
WBC: 5.7 10*3/uL (ref 4.0–10.5)

## 2013-01-20 LAB — BASIC METABOLIC PANEL
CO2: 29 mEq/L (ref 19–32)
Calcium: 8.6 mg/dL (ref 8.4–10.5)
Chloride: 103 mEq/L (ref 96–112)
GFR calc Af Amer: 27 mL/min — ABNORMAL LOW (ref >90)
Sodium: 142 mEq/L (ref 135–145)

## 2013-01-20 LAB — PROTIME-INR: INR: 1.77 — ABNORMAL HIGH (ref 0.00–1.49)

## 2013-01-20 MED ORDER — WARFARIN SODIUM 5 MG PO TABS
5.0000 mg | ORAL_TABLET | Freq: Once | ORAL | Status: AC
  Administered 2013-01-20: 5 mg via ORAL
  Filled 2013-01-20: qty 1

## 2013-01-20 NOTE — Progress Notes (Signed)
Primary cardiologist: Dr. Kristeen Miss  Subjective:   Eating breakfast. Gradually feelsing better. No chest pain.   Objective:   Temp:  [98.1 F (36.7 C)-98.4 F (36.9 C)] 98.1 F (36.7 C) (09/01 0541) Pulse Rate:  [65-78] 75 (09/01 0541) Resp:  [18-20] 20 (09/01 0541) BP: (119-150)/(49-79) 150/73 mmHg (09/01 0541) SpO2:  [94 %-98 %] 94 % (09/01 0541) Weight:  [243 lb 6.2 oz (110.4 kg)] 243 lb 6.2 oz (110.4 kg) (09/01 0541) Last BM Date: 01/19/13  Filed Weights   01/18/13 0416 01/19/13 0444 01/20/13 0541  Weight: 245 lb 9.5 oz (111.4 kg) 244 lb 9.6 oz (110.95 kg) 243 lb 6.2 oz (110.4 kg)    Intake/Output Summary (Last 24 hours) at 01/20/13 0757 Last data filed at 01/20/13 0700  Gross per 24 hour  Intake    937 ml  Output   2251 ml  Net  -1314 ml   Telemetry: Atrial fibrillation with IVCD.  Exam:  General: NAD.  Lungs: Decreased at bases with crackles.  Cardiac: Irregularly irregular, no gallop.  Abdomen: NABS.  Extremities: 2-3 plus edema.   Lab Results:  Basic Metabolic Panel:  Recent Labs Lab 01/18/13 0410 01/19/13 0408 01/20/13 0345  NA 141 141 142  K 3.9 3.8 3.9  CL 102 101 103  CO2 26 29 29   GLUCOSE 89 98 108*  BUN 36* 37* 36*  CREATININE 2.57* 2.62* 2.48*  CALCIUM 9.0 8.8 8.6    Liver Function Tests:  Recent Labs Lab 01/15/13 1235  AST 16  ALT 11  ALKPHOS 49  BILITOT 0.6  PROT 6.3  ALBUMIN 3.5    CBC:  Recent Labs Lab 01/18/13 0410 01/19/13 0408 01/20/13 0345  WBC 6.9 5.9 5.7  HGB 9.8* 9.5* 9.4*  HCT 31.0* 29.6* 29.1*  MCV 92.0 90.8 91.2  PLT 127* 122* 132*    BNP:  Recent Labs  12/16/12 1200 01/15/13 1235  PROBNP 4332.0* 8620.0*    Coagulation:  Recent Labs Lab 01/18/13 0410 01/19/13 0408 01/20/13 0345  INR 1.26 1.54* 1.77*     Medications:   Scheduled Medications: . aspirin EC  81 mg Oral Daily  . atorvastatin  20 mg Oral q1800  . carvedilol  6.25 mg Oral BID WC  . doxazosin  4 mg Oral QHS   . famotidine  10 mg Oral q morning - 10a  . ferrous sulfate  325 mg Oral Q breakfast  . hydrALAZINE  10 mg Oral Q6H  . hydroxychloroquine  200 mg Oral BID  . isosorbide mononitrate  30 mg Oral Daily  . potassium chloride SA  40 mEq Oral Daily  . sodium chloride  3 mL Intravenous Q12H  . Warfarin - Pharmacist Dosing Inpatient   Does not apply q1800   Lasix infusion at 5 mg per hour  PRN Medications: sodium chloride, acetaminophen, albuterol, colchicine, febuxostat, ondansetron (ZOFRAN) IV, sodium chloride   Assessment:   1. Acute on chronic systolic CHF, LVEF 25-30%. Continues to diurese, although has dropped off somewhat.  2. CKD, stage 3-4. Creatinine down to 2.4.. Not on ACE inhibitor or ARB.  3. CAF, rate controlled. Coumadin has been restarted per Pharmacy.  4. Moderate to severe AS.  5. CAD s/p CABG.   Plan/Discussion:    Tolerating medication adjustments including switch to Lasix drip. Better diuresis yesterday. Creatinine is actually coming down. Difficulty will be in determining whether to pursue further workup of his cardiomyopathy and AS. He would be at high risk for contrast nephropathy.  Will defer to Dr. Elease Hashimoto.  Jonelle Sidle, M.D., F.A.C.C.

## 2013-01-20 NOTE — Progress Notes (Signed)
ANTICOAGULATION CONSULT NOTE - Follow Up Consult  Pharmacy Consult for Coumadin Indication: atrial fibrillation  No Known Allergies  Patient Measurements: Height: 5\' 7"  (170.2 cm) Weight: 243 lb 6.2 oz (110.4 kg) (scale B) IBW/kg (Calculated) : 66.1 Heparin Dosing Weight: 92.2 kg  Vital Signs: Temp: 97.4 F (36.3 C) (09/01 1049) Temp src: Oral (09/01 1049) BP: 147/85 mmHg (09/01 1049) Pulse Rate: 76 (09/01 1049)  Labs:  Recent Labs  01/18/13 0410 01/19/13 0408 01/20/13 0345  HGB 9.8* 9.5* 9.4*  HCT 31.0* 29.6* 29.1*  PLT 127* 122* 132*  LABPROT 15.5* 18.1* 20.1*  INR 1.26 1.54* 1.77*  CREATININE 2.57* 2.62* 2.48*    Estimated Creatinine Clearance: 28.2 ml/min (by C-G formula based on Cr of 2.48).  Assessment: 79 y/o male admitted 01/15/2013  for worsening dyspnea and volume overload to begin IV diuresis inpatient.   Recent admission (7/25-8/1) for severe epistaxis requiring mechanical ventilation for airway protection and is s/p right maxillary and facial artery branch embolization.  Off Plavix since that time.  Coumadin for cnronic afib. INR 1.77,  trending up towards goal. CBC low stable. Platelet count still low, but slight trend up. No bleeding reported.  Home Coumadin regimen: 2.5 mg daily except 5 mg Mon/Fri.    Plan:  - Coumadin 5 mg again today.    Continue daily PT/INR.    May be able to resume home regimen soon.    Has outpatient appointment for Coumadin follow-up on 9/3.  Will likely need to reschedule.  Wife relates plan to call office on 9/2.  Dennie Fetters, [place] Pager: [phone] 01/20/2013 12:35 PM

## 2013-01-21 LAB — BASIC METABOLIC PANEL
GFR calc Af Amer: 26 mL/min — ABNORMAL LOW (ref >90)
GFR calc non Af Amer: 22 mL/min — ABNORMAL LOW (ref >90)
Glucose, Bld: 99 mg/dL (ref 70–99)
Potassium: 3.7 mEq/L (ref 3.5–5.1)
Sodium: 140 mEq/L (ref 135–145)

## 2013-01-21 LAB — CBC
Hemoglobin: 9.5 g/dL — ABNORMAL LOW (ref 13.0–17.0)
Platelets: 141 10*3/uL — ABNORMAL LOW (ref 150–400)
RBC: 3.3 MIL/uL — ABNORMAL LOW (ref 4.22–5.81)
WBC: 6.2 10*3/uL (ref 4.0–10.5)

## 2013-01-21 LAB — PROTIME-INR: INR: 1.94 — ABNORMAL HIGH (ref 0.00–1.49)

## 2013-01-21 MED ORDER — TORSEMIDE 20 MG PO TABS
40.0000 mg | ORAL_TABLET | Freq: Two times a day (BID) | ORAL | Status: DC
  Administered 2013-01-21 – 2013-01-23 (×5): 40 mg via ORAL
  Filled 2013-01-21 (×6): qty 2

## 2013-01-21 MED ORDER — WARFARIN SODIUM 5 MG PO TABS
5.0000 mg | ORAL_TABLET | Freq: Once | ORAL | Status: AC
  Administered 2013-01-21: 5 mg via ORAL
  Filled 2013-01-21: qty 1

## 2013-01-21 NOTE — Progress Notes (Signed)
Report given to receiving RN. Patient is stable. No signs or symptoms of distress or discomfort.  

## 2013-01-21 NOTE — Progress Notes (Signed)
Utilization Review Completed Britanny Marksberry J. Ethin Drummond, RN, BSN, NCM [phone]  

## 2013-01-21 NOTE — Progress Notes (Signed)
Primary cardiologist: Dr. Kristeen Miss  Subjective:   Eating breakfast. Gradually feelsing better. No chest pain. Still diuresing.  He was put on a lasix drip    Objective:   Temp:  [97.4 F (36.3 C)-98.6 F (37 C)] 97.9 F (36.6 C) (09/02 0612) Pulse Rate:  [75-80] 80 (09/02 0612) Resp:  [18-22] 18 (09/02 0612) BP: (116-158)/(51-85) 158/67 mmHg (09/02 0612) SpO2:  [97 %-99 %] 99 % (09/02 0612) Weight:  [242 lb 3.2 oz (109.861 kg)] 242 lb 3.2 oz (109.861 kg) (09/02 0612) Last BM Date: 01/20/13  Filed Weights   01/19/13 0444 01/20/13 0541 01/21/13 0612  Weight: 244 lb 9.6 oz (110.95 kg) 243 lb 6.2 oz (110.4 kg) 242 lb 3.2 oz (109.861 kg)    Intake/Output Summary (Last 24 hours) at 01/21/13 0729 Last data filed at 01/21/13 0612  Gross per 24 hour  Intake 1620.08 ml  Output   1850 ml  Net -229.92 ml   Telemetry: Atrial fibrillation with IVCD.  Exam:  General: NAD.  Lungs: clear  Cardiac: Irregularly irregular, no gallop.  Abdomen: NABS.  Extremities: 2 plus edema. Not significantly different from baseline   Lab Results:  Basic Metabolic Panel:  Recent Labs Lab 01/19/13 0408 01/20/13 0345 01/21/13 0420  NA 141 142 140  K 3.8 3.9 3.7  CL 101 103 102  CO2 29 29 29   GLUCOSE 98 108* 99  BUN 37* 36* 36*  CREATININE 2.62* 2.48* 2.53*  CALCIUM 8.8 8.6 8.6    Liver Function Tests:  Recent Labs Lab 01/15/13 1235  AST 16  ALT 11  ALKPHOS 49  BILITOT 0.6  PROT 6.3  ALBUMIN 3.5    CBC:  Recent Labs Lab 01/19/13 0408 01/20/13 0345 01/21/13 0420  WBC 5.9 5.7 6.2  HGB 9.5* 9.4* 9.5*  HCT 29.6* 29.1* 29.8*  MCV 90.8 91.2 90.3  PLT 122* 132* 141*    BNP:  Recent Labs  12/16/12 1200 01/15/13 1235  PROBNP 4332.0* 8620.0*    Coagulation:  Recent Labs Lab 01/19/13 0408 01/20/13 0345 01/21/13 0420  INR 1.54* 1.77* 1.94*     Medications:   Scheduled Medications: . aspirin EC  81 mg Oral Daily  . atorvastatin  20 mg Oral  q1800  . carvedilol  6.25 mg Oral BID WC  . doxazosin  4 mg Oral QHS  . famotidine  10 mg Oral q morning - 10a  . ferrous sulfate  325 mg Oral Q breakfast  . hydrALAZINE  10 mg Oral Q6H  . hydroxychloroquine  200 mg Oral BID  . isosorbide mononitrate  30 mg Oral Daily  . potassium chloride SA  40 mEq Oral Daily  . sodium chloride  3 mL Intravenous Q12H  . Warfarin - Pharmacist Dosing Inpatient   Does not apply q1800   Lasix infusion at 5 mg per hour  PRN Medications: sodium chloride, acetaminophen, albuterol, colchicine, febuxostat, ondansetron (ZOFRAN) IV, sodium chloride   Assessment:   1. Acute on chronic systolic CHF, LVEF 25-30%. Continues to diurese, although has dropped off somewhat. Hydralazine has been added to his medications.  Will DC the lasix drip and try Demadex tabs 40 bid.  He is close to his baseline.    2. CKD, stage 3-4. Creatinine down to 2.4.. Not on ACE inhibitor or ARB.  3. CAF, rate controlled. Coumadin has been restarted per Pharmacy.  4. Moderate to severe AS.   I do not think this is playing a major role in his CHF  symptoms.  He would be at very high risk for contrast induced nephropathy if we decide to do cath.    5. CAD s/p CABG.   Vesta Mixer, Montez Hageman., MD, [facility] 01/21/2013, 7:39 AM Office - [phone] Pager [phone] \

## 2013-01-21 NOTE — Progress Notes (Signed)
ANTICOAGULATION CONSULT NOTE - Follow Up Consult  Pharmacy Consult for Coumadin Indication: atrial fibrillation  No Known Allergies  Patient Measurements: Height: 5\' 7"  (170.2 cm) Weight: 242 lb 3.2 oz (109.861 kg) (scale b) IBW/kg (Calculated) : 66.1 Heparin Dosing Weight: 92.2 kg  Vital Signs: Temp: 98.6 F (37 C) (09/02 1335) Temp src: Oral (09/02 1335) BP: 115/60 mmHg (09/02 1335) Pulse Rate: 84 (09/02 1335)  Labs:  Recent Labs  01/19/13 0408 01/20/13 0345 01/21/13 0420  HGB 9.5* 9.4* 9.5*  HCT 29.6* 29.1* 29.8*  PLT 122* 132* 141*  LABPROT 18.1* 20.1* 21.6*  INR 1.54* 1.77* 1.94*  CREATININE 2.62* 2.48* 2.53*    Estimated Creatinine Clearance: 27.5 ml/min (by C-G formula based on Cr of 2.53).  Assessment: 80 y/o male admitted 01/15/2013  for worsening dyspnea and volume overload to begin IV diuresis inpatient.   Recent admission (7/25-8/1) for severe epistaxis requiring mechanical ventilation for airway protection and is s/p right maxillary and facial artery branch embolization.  Off Plavix since that time.  Patient on Coumadin for history of Afib. INR remains below goal at 1.94 today but appears to be trending up. H/H are low but stable, plts trending up, currently no bleeding noted. Home Coumadin regimen was 2.5 mg daily except 5 mg Mon/Fri.   Plan:  -Coumadin 5 mg PO today.  -Daily PT/INR.  -Has outpatient appointment for Coumadin follow-up on 9/3.  Will likely need to reschedule. Wife relates plan to call office on 9/2.  [place], [address].D., BCPS Clinical Pharmacist Pager: [phone] 01/21/2013 2:07 PM

## 2013-01-22 ENCOUNTER — Encounter: Payer: Medicare Other | Admitting: Nurse Practitioner

## 2013-01-22 LAB — PROTIME-INR
INR: 2.08 — ABNORMAL HIGH (ref 0.00–1.49)
Prothrombin Time: 22.7 seconds — ABNORMAL HIGH (ref 11.6–15.2)

## 2013-01-22 MED ORDER — WARFARIN SODIUM 5 MG PO TABS
5.0000 mg | ORAL_TABLET | Freq: Once | ORAL | Status: AC
  Administered 2013-01-22: 18:00:00 5 mg via ORAL
  Filled 2013-01-22: qty 1

## 2013-01-22 NOTE — Progress Notes (Signed)
Primary cardiologist: Dr. Kristeen Miss  Subjective:   Eating breakfast. Gradually feelsing better. No chest pain. Still diuresing.  We started Demadex yesterday   Objective:   Temp:  [97.9 F (36.6 C)-98.6 F (37 C)] 97.9 F (36.6 C) (09/03 0508) Pulse Rate:  [74-92] 85 (09/03 0508) Resp:  [18] 18 (09/03 0508) BP: (115-153)/(60-82) 153/80 mmHg (09/03 0508) SpO2:  [95 %-99 %] 95 % (09/03 0508) Weight:  [242 lb 1.6 oz (109.816 kg)] 242 lb 1.6 oz (109.816 kg) (09/03 0508) Last BM Date: 01/21/13  Filed Weights   01/20/13 0541 01/21/13 0612 01/22/13 0508  Weight: 243 lb 6.2 oz (110.4 kg) 242 lb 3.2 oz (109.861 kg) 242 lb 1.6 oz (109.816 kg)    Intake/Output Summary (Last 24 hours) at 01/22/13 0718 Last data filed at 01/22/13 0454  Gross per 24 hour  Intake   1206 ml  Output   1545 ml  Net   -339 ml   Telemetry: Atrial fibrillation with IVCD.  Exam:  General: NAD.  Lungs: clear  Cardiac: Irregularly irregular, no gallop. Soft systolic murmur.  Abdomen: NABS.  Extremities: 2 plus edema. Not significantly different from baseline   Lab Results:  Basic Metabolic Panel:  Recent Labs Lab 01/19/13 0408 01/20/13 0345 01/21/13 0420  NA 141 142 140  K 3.8 3.9 3.7  CL 101 103 102  CO2 29 29 29   GLUCOSE 98 108* 99  BUN 37* 36* 36*  CREATININE 2.62* 2.48* 2.53*  CALCIUM 8.8 8.6 8.6    Liver Function Tests:  Recent Labs Lab 01/15/13 1235  AST 16  ALT 11  ALKPHOS 49  BILITOT 0.6  PROT 6.3  ALBUMIN 3.5    CBC:  Recent Labs Lab 01/19/13 0408 01/20/13 0345 01/21/13 0420  WBC 5.9 5.7 6.2  HGB 9.5* 9.4* 9.5*  HCT 29.6* 29.1* 29.8*  MCV 90.8 91.2 90.3  PLT 122* 132* 141*    BNP:  Recent Labs  12/16/12 1200 01/15/13 1235  PROBNP 4332.0* 8620.0*    Coagulation:  Recent Labs Lab 01/20/13 0345 01/21/13 0420 01/22/13 0500  INR 1.77* 1.94* 2.08*     Medications:   Scheduled Medications: . aspirin EC  81 mg Oral Daily  .  atorvastatin  20 mg Oral q1800  . carvedilol  6.25 mg Oral BID WC  . doxazosin  4 mg Oral QHS  . famotidine  10 mg Oral q morning - 10a  . ferrous sulfate  325 mg Oral Q breakfast  . hydrALAZINE  10 mg Oral Q6H  . hydroxychloroquine  200 mg Oral BID  . isosorbide mononitrate  30 mg Oral Daily  . potassium chloride SA  40 mEq Oral Daily  . sodium chloride  3 mL Intravenous Q12H  . torsemide  40 mg Oral BID  . Warfarin - Pharmacist Dosing Inpatient   Does not apply q1800   Lasix infusion at 5 mg per hour  PRN Medications: sodium chloride, acetaminophen, albuterol, colchicine, febuxostat, ondansetron (ZOFRAN) IV, sodium chloride   Assessment:   1. Acute on chronic systolic CHF, LVEF 25-30%. Continues to diurese.  He does not think he is strong enough to go home today - will anticipate DC tomorrow.  Cont. demadex at present dose.  2. CKD, stage 3-4.   Not on ACE inhibitor or ARB.  3. PAF, rate controlled. Coumadin has been restarted per Pharmacy.  INR is 2.08  4. Moderate to severe AS.   I do not think this is playing a major  role in his CHF symptoms.  He would be at very high risk for contrast induced nephropathy if we decide to do cath.    5. CAD s/p CABG.  6. Anemia:     Alvia Grove., MD, [facility] 01/22/2013, 7:18 AM Office - [phone] Pager [phone] \

## 2013-01-22 NOTE — Plan of Care (Addendum)
Problem: Phase III Progression Outcomes Goal: Activity at appropriate level-compared to baseline Outcome: Progressing Patient ambulated in hall x2 on night shift.  Continues to diurese with PO Torsemide.  Monitoring intake and output.  No complications this shift.

## 2013-01-22 NOTE — Progress Notes (Signed)
Report given to receiving RN. Patient is stable. No signs or symptoms of distress or discomfort.  

## 2013-01-22 NOTE — Progress Notes (Signed)
ANTICOAGULATION CONSULT NOTE - Follow Up Consult  Pharmacy Consult for Coumadin Indication: atrial fibrillation  No Known Allergies  Patient Measurements: Height: 5\' 7"  (170.2 cm) Weight: 242 lb 1.6 oz (109.816 kg) (scale b) IBW/kg (Calculated) : 66.1 Heparin Dosing Weight: 92.2 kg  Vital Signs: Temp: 97.9 F (36.6 C) (09/03 0508) Temp src: Oral (09/03 0508) BP: 135/75 mmHg (09/03 1025) Pulse Rate: 76 (09/03 1025)  Labs:  Recent Labs  01/20/13 0345 01/21/13 0420 01/22/13 0500  HGB 9.4* 9.5*  --   HCT 29.1* 29.8*  --   PLT 132* 141*  --   LABPROT 20.1* 21.6* 22.7*  INR 1.77* 1.94* 2.08*  CREATININE 2.48* 2.53*  --     Estimated Creatinine Clearance: 27.5 ml/min (by C-G formula based on Cr of 2.53).  Assessment: 79 y/o male admitted 01/15/2013  for worsening dyspnea and volume overload to begin IV diuresis inpatient.   Recent admission (7/25-8/1) for severe epistaxis requiring mechanical ventilation for airway protection and is s/p right maxillary and facial artery branch embolization.  Off Plavix since that time.  Patient on Coumadin for history of Afib. INR 2.08 at goal but requiring higher doses than PTA.  H/H are low but stable, plts trending up, currently no bleeding noted. Home Coumadin regimen was 2.5 mg daily except 5 mg Mon/Fri.   Plan:  -Coumadin 5 mg PO today.  -Daily PT/INR.  Leota Sauers Pharm.D. CPP, BCPS Clinical Pharmacist [phone] 01/22/2013 11:52 AM

## 2013-01-23 ENCOUNTER — Other Ambulatory Visit: Payer: Self-pay | Admitting: Physician Assistant

## 2013-01-23 ENCOUNTER — Encounter (HOSPITAL_COMMUNITY): Payer: Self-pay | Admitting: Physician Assistant

## 2013-01-23 DIAGNOSIS — N189 Chronic kidney disease, unspecified: Secondary | ICD-10-CM

## 2013-01-23 LAB — BASIC METABOLIC PANEL
Calcium: 8.7 mg/dL (ref 8.4–10.5)
Creatinine, Ser: 2.47 mg/dL — ABNORMAL HIGH (ref 0.50–1.35)
GFR calc non Af Amer: 23 mL/min — ABNORMAL LOW (ref >90)
Sodium: 143 mEq/L (ref 135–145)

## 2013-01-23 LAB — PROTIME-INR
INR: 2.42 — ABNORMAL HIGH (ref 0.00–1.49)
Prothrombin Time: 25.5 seconds — ABNORMAL HIGH (ref 11.6–15.2)

## 2013-01-23 MED ORDER — CARVEDILOL 6.25 MG PO TABS: 6.2500 mg | ORAL_TABLET | Freq: Two times a day (BID) | ORAL | Status: DC

## 2013-01-23 MED ORDER — ISOSORBIDE MONONITRATE ER 30 MG PO TB24: 30.0000 mg | ORAL_TABLET | Freq: Every day | ORAL | Status: DC

## 2013-01-23 MED ORDER — WARFARIN SODIUM 5 MG PO TABS: 5.0000 mg | ORAL_TABLET | Freq: Every day | ORAL | Status: DC

## 2013-01-23 MED ORDER — HYDRALAZINE HCL 10 MG PO TABS: 15.0000 mg | ORAL_TABLET | Freq: Three times a day (TID) | ORAL | Status: DC

## 2013-01-23 MED ORDER — TORSEMIDE 20 MG PO TABS: 40.0000 mg | ORAL_TABLET | Freq: Two times a day (BID) | ORAL | Status: DC

## 2013-01-23 MED ORDER — WARFARIN SODIUM 5 MG PO TABS
5.0000 mg | ORAL_TABLET | Freq: Once | ORAL | Status: AC
  Administered 2013-01-23: 5 mg via ORAL
  Filled 2013-01-23: qty 1

## 2013-01-23 NOTE — Progress Notes (Signed)
DC IV, DC Tele, DC Home. Discharge instructions and home medications discussed with patient and patient's wife. Patient and wife denied any questions or concerns at this time. Patient leaving unit via wheelchair and appears in no acute distress. 

## 2013-01-23 NOTE — Plan of Care (Signed)
Problem: Phase III Progression Outcomes Goal: Other Phase III Outcomes/Goals Outcome: Completed/Met Date Met:  01/23/13 Continues to diurese well on PO Torsemide.  Pending discharge 01/23/13.  No complications this shift.

## 2013-01-23 NOTE — Progress Notes (Signed)
Primary cardiologist: Dr. Kristeen Miss  Subjective:   Eating breakfast. Gradually feelsing better. No chest pain. Still diuresing.  We started Demadex and he has done well. Continues to diurese.   Objective:   Temp:  [97.9 F (36.6 C)-98.3 F (36.8 C)] 98.2 F (36.8 C) (09/04 0502) Pulse Rate:  [73-82] 77 (09/04 0502) Resp:  [18-19] 18 (09/04 0502) BP: (127-144)/(53-75) 139/65 mmHg (09/04 0502) SpO2:  [96 %-99 %] 96 % (09/04 0502) Weight:  [242 lb 1 oz (109.8 kg)] 242 lb 1 oz (109.8 kg) (09/04 0502) Last BM Date: 01/22/13  Filed Weights   01/21/13 0612 01/22/13 0508 01/23/13 0502  Weight: 242 lb 3.2 oz (109.861 kg) 242 lb 1.6 oz (109.816 kg) 242 lb 1 oz (109.8 kg)    Intake/Output Summary (Last 24 hours) at 01/23/13 0756 Last data filed at 01/23/13 0600  Gross per 24 hour  Intake   1320 ml  Output   1925 ml  Net   -605 ml   Telemetry: Atrial fibrillation with IVCD.  Exam:  General: NAD.  Lungs: clear  Cardiac: Irregularly irregular, no gallop. Soft systolic murmur.  Abdomen: NABS.  Extremities: 2 plus edema. Not significantly different from baseline   Lab Results:  Basic Metabolic Panel:  Recent Labs Lab 01/20/13 0345 01/21/13 0420 01/23/13 0550  NA 142 140 143  K 3.9 3.7 3.9  CL 103 102 104  CO2 29 29 28   GLUCOSE 108* 99 102*  BUN 36* 36* 36*  CREATININE 2.48* 2.53* 2.47*  CALCIUM 8.6 8.6 8.7    Liver Function Tests: No results found for this basename: AST, ALT, ALKPHOS, BILITOT, PROT, ALBUMIN,  in the last 168 hours  CBC:  Recent Labs Lab 01/19/13 0408 01/20/13 0345 01/21/13 0420  WBC 5.9 5.7 6.2  HGB 9.5* 9.4* 9.5*  HCT 29.6* 29.1* 29.8*  MCV 90.8 91.2 90.3  PLT 122* 132* 141*    BNP:  Recent Labs  12/16/12 1200 01/15/13 1235  PROBNP 4332.0* 8620.0*    Coagulation:  Recent Labs Lab 01/21/13 0420 01/22/13 0500 01/23/13 0550  INR 1.94* 2.08* 2.42*     Medications:   Scheduled Medications: . aspirin EC  81 mg  Oral Daily  . atorvastatin  20 mg Oral q1800  . carvedilol  6.25 mg Oral BID WC  . doxazosin  4 mg Oral QHS  . famotidine  10 mg Oral q morning - 10a  . ferrous sulfate  325 mg Oral Q breakfast  . hydrALAZINE  10 mg Oral Q6H  . hydroxychloroquine  200 mg Oral BID  . isosorbide mononitrate  30 mg Oral Daily  . potassium chloride SA  40 mEq Oral Daily  . sodium chloride  3 mL Intravenous Q12H  . torsemide  40 mg Oral BID  . Warfarin - Pharmacist Dosing Inpatient   Does not apply q1800   Lasix infusion at 5 mg per hour  PRN Medications: sodium chloride, acetaminophen, albuterol, colchicine, febuxostat, ondansetron (ZOFRAN) IV, sodium chloride   Assessment:   1. Acute on chronic systolic CHF, LVEF 25-30%. Continues to diurese. Continue current meds. Ok for DC today. To see PA in a week with BMP To see me in 1-2 months with BMP Coumadin clinic on Monday  2. CKD, stage 3-4.   Not on ACE inhibitor or ARB.  Doing well on hydralazine  3. PAF, rate controlled. Coumadin has been restarted per Pharmacy.  INR is  Theraputic.  Coumadin clinic monday  4. Moderate to severe  AS.   I do not think this is playing a major role in his CHF symptoms.  He would be at very high risk for contrast induced nephropathy if we decide to do cath.    5. CAD s/p CABG.  6. Anemia:     Dc to home   Vesta Mixer, Montez Hageman., MD, [facility] 01/23/2013, 7:56 AM Office - [phone] Pager [phone] \

## 2013-01-23 NOTE — Progress Notes (Signed)
ANTICOAGULATION CONSULT NOTE - Follow Up Consult  Pharmacy Consult for Coumadin Indication: atrial fibrillation  No Known Allergies  Patient Measurements: Height: 5\' 7"  (170.2 cm) Weight: 242 lb 1 oz (109.8 kg) IBW/kg (Calculated) : 66.1 Heparin Dosing Weight: 92.2 kg  Vital Signs: Temp: 98.2 F (36.8 C) (09/04 0502) Temp src: Oral (09/04 0502) BP: 129/70 mmHg (09/04 0944) Pulse Rate: 80 (09/04 0944)  Labs:  Recent Labs  01/21/13 0420 01/22/13 0500 01/23/13 0550  HGB 9.5*  --   --   HCT 29.8*  --   --   PLT 141*  --   --   LABPROT 21.6* 22.7* 25.5*  INR 1.94* 2.08* 2.42*  CREATININE 2.53*  --  2.47*    Estimated Creatinine Clearance: 28.2 ml/min (by C-G formula based on Cr of 2.47).  Assessment: 79 y/o male admitted 01/15/2013  for worsening dyspnea and volume overload to begin IV diuresis inpatient. Patient on Coumadin for history of Afib. INR 2.42 at goal but requiring higher doses than PTA. Currently no bleeding noted.   Home Coumadin regimen was 2.5 mg daily except 5 mg Mon/Fri.   Plan:  -Coumadin 5 mg PO today.  -Daily PT/INR.  -Recommmend coumadin 5mg  daily after discharge  Bayard Hugger, PharmD, BCPS  Clinical Pharmacist  Pager: [phone]   01/23/2013 11:20 AM

## 2013-01-23 NOTE — Discharge Summary (Signed)
Discharge Summary   Patient ID: Alex Orozco MRN: [id number], DOB/AGE: Nov 27, 1929 79 y.o. Admit date: 01/15/2013 D/C date:     01/23/2013  Primary Cardiologist: Alex Orozco  Primary Discharge Diagnoses:  1. Acute on chronic systolic CHF with history of chronic diastolic CHF - EF newly decreased to 25-30% this admission (remotely had EF 40-50% but EF last known 55-65% in 08/2011) 2. Chronic kidney disease stage 3-4 3. Paroxysmal atrial fibrillation - Coumadin resumed this admission, rates controlled 4. Moderate to severe AS this admission 5. CAD - history: a. s/p CABG 1990; b. NSTEMI 9/10: LHC with S-RCA ok, S-D1 occluded (chronic), S-D3 occluded, L-LAD ok; PCI: Endeavour DES to S-D3; c. 10/2011 Lexiscan MV EF 37%, medium sized fixed defect over basilar to mid lateral wall. No ischemia.  6. Anemia, appeared stable 7. Recent severe epistaxis 11/2012 requiring mechanical ventilation for airway protection c/b blood loss anemia 8. Moderate MR by echo this admission  Secondary Discharge Diagnoses:  1. HTN 2. HL 3. OSA 4. Thrombocytopenia 5. Restrictive lung disease 6. Pleural effusion 7. Arthritis  Hospital Course: [name] is a 79 y.o. male who presented to Dr. Alford Highland office 01/15/2013 for post-hospital visit following admission 7/25-8/1 with severe epistaxis requiring mechanical ventilation for airway protection c/b blood loss anemia, a/c diastolic CHF and deconditioning. During that office visit, he was found to be volume overloaded and subsequently sent over to [facility] for admission.  He has a hx of CAD, s/p CABG, s/p NSTEMI 9/10 treated with a DES to the SVG-D3, restrictive lung disease, CKD, diastolic CHF, HTN, HL, moderate AS, AFib, OSA, anemia and thrombocytopenia. With regard to his July admission, he had presented to the hospital for right maxillary and facial artery branch embolization by interventional radiology for severe uncontrolled epistaxis that had failed outpatient treatment. He  was placed on the ventilator for airway protection. Antiplatelets and Coumadin were held. Cardiology saw the patient. His aspirin was eventually resumed. It was felt that he should remain off of Plavix. It was felt that he would eventually restart Coumadin when cleared by ENT as an outpatient. The patient did have some difficulty with volume overload requiring IV Lasix.   Since that discharge, he had called in complaining of worsening dyspnea. Lasix was changed to Demadex 20mg  BID without much improvement. Weight was up 17lbs from discharge. He denied PND, orthopnea or CP. EKG showed Atrial fibrillation, HR 83, normal axis, nonspecific ST-T wave changes. He had since seen ENT and was told he can resume coumadin. He was felt to have acute on chronic CHF so was admitted to the hospital for diuresis. pbnp was elevated. He was placed on IV Lasix. He was placed back on Coumadin (no overlap with heparin given recent nose bleed). Aspirin was discontinued per discussion with Dr. Elease Hashimoto. Initially his CHF was felt to be diastolic, but updated 2D Echo showed a decreased EF new from previous. 2D echo was obtained demonstrating EF 25-30% (previously 55-65% in 08/2011), as well as mod-severe AS and mod MR. Dr. Elease Hashimoto was not convinced his mod-severe AS was causing him much trouble. He suspected the limitations were primarily due to the pt's systolic and diastolic dysfunction. BB was changed to Coreg given his cardiomyopathy. No ACEI/ARB due to CKD. Norvasc was changed to hydralazine/nitrate. Cr was observed closely with diuresis and did fluctuate slightly but remained generally stable. He required transition to lasix drip on 8/31 to facilitate diuresis. This was transitioned to oral Demadex on 01/21/13. Dr. Elease Hashimoto elected to avoid  cath at this time due to risk for contrast induced nephropathy. The patient feels much better today. He has diuresed from 252lbs to 242lbs, -6.7L. Dr. Elease Hashimoto has seen and examined the patient today and  feels he is stable for discharge.  Nutrition saw the patient and educated him on salt restriction (he admitted to snacking frequently on "cheese doodles" at home). The pt declined HH services. He will have INR check on Monday 9/8 (pharmacy recommending Coumadin 5mg  daily for now) with BMET, f/u with PA 9/9, and f/u Edinson Orozco 1-2 months with BMET.  Discharge Vitals: Blood pressure 134/66, pulse 66, temperature 98.2 F (36.8 C), temperature source Oral, resp. rate 18, height 5\' 7"  (1.702 m), weight 242 lb 1 oz (109.8 kg), SpO2 96.00%.  Labs: Lab Results  Component Value Date   WBC 6.2 01/21/2013   HGB 9.5* 01/21/2013   HCT 29.8* 01/21/2013   MCV 90.3 01/21/2013   PLT 141* 01/21/2013     Recent Labs Lab 01/23/13 0550  NA 143  K 3.9  CL 104  CO2 28  BUN 36*  CREATININE 2.47*  CALCIUM 8.7  GLUCOSE 102*    Lab Results  Component Value Date   CHOL 86 01/08/2012   HDL 38.30* 01/08/2012   LDLCALC 27 01/08/2012   TRIG 158* 12/15/2012    Diagnostic Studies/Procedures   2D Echo 01/16/13 - Left ventricle: The cavity size was mildly dilated. Wall thickness was increased in a pattern of mild LVH. Systolic function was severely reduced. The estimated ejection fraction was in the range of 25% to 30%. Diffuse hypokinesis. Doppler parameters are consistent with high ventricular filling pressure. - Aortic valve: There was moderate to severe stenosis. Trivial regurgitation. Valve area: 0.99cm^2(VTI). Valve area: 0.84cm^2 (Vmax). - Mitral valve: Calcified annulus. Moderate regurgitation. - Left atrium: The atrium was moderately dilated. - Right ventricle: The cavity size was mildly dilated. Systolic function was mildly reduced. - Right atrium: The atrium was moderately dilated. - Pulmonary arteries: Systolic pressure was mildly increased. PA peak pressure: 40mm Hg (S). Impressions: - Aortic valve calcified with fusion of left and right coronary cusps; moderate to severe AS.  Dg Chest 2  View 01/16/2013   *RADIOLOGY REPORT*  Clinical Data: Congestive heart failure, shortness of breath  CHEST - 2 VIEW  Comparison: December 19, 2012  Findings: The heart size is enlarged.  There is central pulmonary vascular congestion.  There is no focal pneumonia.  Minimal posterior bilateral pleural effusions are identified.  The patient status post prior median sternotomy and CABG.  Soft tissues and osseous structures are stable.  IMPRESSION: Cardiomegaly with central pulmonary vascular congestion.   Original Report Authenticated By: Sherian Rein, M.D.    Discharge Medications     Medication List    STOP taking these medications       amLODipine 2.5 MG tablet  Commonly known as:  NORVASC     aspirin 81 MG tablet     metoprolol tartrate 25 MG tablet  Commonly known as:  LOPRESSOR      TAKE these medications       carvedilol 6.25 MG tablet  Commonly known as:  COREG  Take 1 tablet (6.25 mg total) by mouth 2 (two) times daily with a meal.     colchicine 0.6 MG tablet  Take 0.6 mg by mouth daily as needed. For gout     doxazosin 4 MG tablet  Commonly known as:  CARDURA  Take 1 tablet (4 mg total) by mouth at  bedtime.     famotidine 10 MG tablet  Commonly known as:  PEPCID  Take 10 mg by mouth every morning. For acid     febuxostat 40 MG tablet  Commonly known as:  ULORIC  Take 40 mg by mouth daily as needed. For gout     feeding supplement Liqd  Take 1 Container by mouth 3 (three) times daily between meals.     fish oil-omega-3 fatty acids 1000 MG capsule  Take 1 g by mouth daily.     hydrALAZINE 10 MG tablet  Commonly known as:  APRESOLINE  Take 1.5 tablets (15 mg total) by mouth 3 (three) times daily.     hydroxychloroquine 200 MG tablet  Commonly known as:  PLAQUENIL  Take 200 mg by mouth 2 (two) times daily.     Iron 240 (27 FE) MG Tabs  Take 1 tablet by mouth daily.     isosorbide mononitrate 30 MG 24 hr tablet  Commonly known as:  IMDUR  Take 1 tablet (30 mg  total) by mouth daily.     levalbuterol 0.63 MG/3ML nebulizer solution  Commonly known as:  XOPENEX  Take 3 mLs (0.63 mg total) by nebulization every 4 (four) hours as needed for wheezing.     potassium chloride SA 20 MEQ tablet  Commonly known as:  K-DUR,KLOR-CON  Take 2 tablets (40 mEq total) by mouth daily.     rosuvastatin 10 MG tablet  Commonly known as:  CRESTOR  Take 10 mg by mouth every evening.     torsemide 20 MG tablet  Commonly known as:  DEMADEX  Take 2 tablets (40 mg total) by mouth 2 (two) times daily.     Vitamin D-3 5000 UNITS Tabs  Take 1 tablet by mouth daily.     warfarin 5 MG tablet  Commonly known as:  COUMADIN  Take 1 tablet (5 mg total) by mouth daily.        Disposition   The patient will be discharged in stable condition to home. Discharge Orders   Future Appointments Provider Department Dept Phone   01/27/2013 10:20 AM [facility] Main Office [place]) [phone]   01/27/2013 12:30 PM [facility] [phone]   01/28/2013 10:00 AM Minda Meo, PA-C [facility] Main Office [place]) [phone]   03/04/2013 7:40 AM [facility] Main Office [place]) [phone]   03/04/2013 11:30 AM Vesta Mixer, MD [facility] Main Office [place]) [phone]   07/02/2013 1:45 PM [name], MD [facility] [phone]   Future Orders Complete By Expires   Diet - low sodium heart healthy  As directed    Discharge instructions  As directed    Comments:     One of your heart tests showed weakness of the heart muscle this admission. This may make you more susceptible to weight gain from fluid retention, which can lead to symptoms that we call heart failure. For patients with congestive heart failure, we give them these special instructions:  1. Follow a low-salt diet and watch your fluid intake. In general, you should not be taking in more than 2  liters of fluid per day (no more than 8 glasses per day). Some patients are restricted to less than 1.5 liters of fluid per day (no more than 6 glasses per day). This includes sources of water in foods like soup, coffee, tea, milk, etc. 2. Weigh yourself on the same scale at same  time of day and keep a log. 3. Call your doctor: (Anytime you feel any of the following symptoms)  - 3-4 pound weight gain in 1-2 days or 2 pounds overnight  - Shortness of breath, with or without a dry hacking cough  - Swelling in the hands, feet or stomach  - If you have to sleep on extra pillows at night in order to breathe  IT IS IMPORTANT TO LET YOUR DOCTOR KNOW EARLY ON IF YOU ARE HAVING SYMPTOMS SO WE CAN HELP YOU!   Increase activity slowly  As directed      Follow-up Information   Follow up with [facility]. (Coumadin Clinic 01/27/13 at 12:15pm - you will also have electrolytes/kidney function drawn that day)    Contact information:   [address] [place] [address]       Follow up with Rick Duff, PA-C. ([facility] - 01/28/13 at 10am)    Specialty:  Cardiology   Contact information:   [address] [place] [address] [phone]       Follow up with [name]., MD. ([facility] - 03/04/13 at 11:30am)    Specialty:  Cardiology   Contact information:   [address] [place] [address] [phone]         Duration of Discharge Encounter: Greater than 30 minutes including physician and PA time.  Signed, Ronie Spies PA-C 01/23/2013, 3:22 PM  Attending Note:   The patient was seen and examined.  Agree with assessment and plan as noted above.  Changes made to the above note as needed.  Pt is doing well.  Has continued to diurese with the PO Demadex. Stable for DC  Alvia Grove., MD, [facility] 01/23/2013, 7:17 PM

## 2013-01-27 ENCOUNTER — Ambulatory Visit (INDEPENDENT_AMBULATORY_CARE_PROVIDER_SITE_OTHER): Payer: Medicare Other | Admitting: Pharmacist

## 2013-01-27 ENCOUNTER — Other Ambulatory Visit (INDEPENDENT_AMBULATORY_CARE_PROVIDER_SITE_OTHER): Payer: Medicare Other

## 2013-01-27 DIAGNOSIS — I4891 Unspecified atrial fibrillation: Secondary | ICD-10-CM

## 2013-01-27 DIAGNOSIS — N189 Chronic kidney disease, unspecified: Secondary | ICD-10-CM

## 2013-01-27 LAB — BASIC METABOLIC PANEL
Calcium: 8.9 mg/dL (ref 8.4–10.5)
Creatinine, Ser: 2.7 mg/dL — ABNORMAL HIGH (ref 0.4–1.5)
GFR: 24.03 mL/min — ABNORMAL LOW (ref >60.00)
Sodium: 141 mEq/L (ref 135–145)

## 2013-01-27 LAB — POCT INR: INR: 4.5

## 2013-01-27 NOTE — Progress Notes (Signed)
CARDIOLOGY OFFICE NOTE  Patient ID: Alex Orozco MRN: [id number], DOB/AGE: [birth year]   Date of Visit: 01/28/2013  Primary Physician: Kaleen Mask, MD Primary Cardiologist: Elease Hashimoto, MD Reason for Visit: Hospital follow-up  History of Present Illness  Alex Orozco is a 79 y.o. male with CAD s/p CABG, s/p NSTEMI and PCI with DES to the SVG-D3 in 2010, restrictive lung disease, CKD, diastolic HF, HTN, mod-sev AS, AFib, OSA, anemia and thrombocytopenia.   He was recently admitted on 01/15/2013 to [facility] with acute systolic HF and new LV dysfunction. Transthoracic echo was obtained demonstrating EF 25-30% (previously 55-65% in April 2013), as well as mod-severe AS and mod MR. According to the DC summary, Dr. Elease Hashimoto was not convinced his mod-severe AS was causing him much trouble. He suspected his limitations were primarily due to systolic and diastolic dysfunction. BB was changed to Coreg given his cardiomyopathy. No ACEI/ARB due to CKD. Norvasc was changed to hydralazine/nitrate. He was discharged on 01/23/2013. He presents today for hospital follow-up.  Since discharge, he reports he is doing well. He has no complaints other than fatigue; however, this is slowly improving with increased activity / ambulation with walker. He denies chest pain or shortness of breath. He denies palpitations, dizziness, near syncope or syncope. He states his LE swelling continues to improve. He denies abdominal swelling, orthopnea, PND or recent weight gain. His weight is actually down by 2-3 lbs. He is compliant with medications.  Past Medical History Past Medical History  Diagnosis Date  . Coronary artery disease     a.  s/p CABG 1990;  b. NSTEMI 9/10:  LHC with S-RCA ok, S-D1 occluded (chronic), S-D3 occluded, L-LAD ok;  PCI:  Endeavour DES to S-D3;  c. 10/2011 Lexiscan MV EF 37%, medium sized fixed defect over basilar to mid lateral wall.  No ishcemia.   . Lung disease, restrictive   . History of PFTs  01/20/2009  . Pleural effusion   . Chronic renal insufficiency   . CHF (congestive heart failure)     a. Previous EF 40-50%;  b. Echo 08/2011: mild LVH, EF 55-65, grade 3 diast dysfxn. c. EF 25-30% by echo 12/2012.  Marland Kitchen Hypertension   . Dyslipidemia   . Aortic stenosis     a. echo 4/13: mild AS with mean 19 mmHg. b. echo 12/2012: mod-severe AS.  Marland Kitchen PAF (paroxysmal atrial fibrillation)     a. Chronic coumadin  . Anemia   . Difficult intubation   . Arthritis   . Epistaxis     a. 11/2012: adm with severe epistaxis requiring mechanical ventilation for airway protection c/b blood loss anemia  . Mitral regurgitation     a. Mod by echo 12/2012.    Past Surgical History Past Surgical History  Procedure Laterality Date  . Coronary artery bypass graft  1999  . Coronary angioplasty with stent placement  2010    DES to the SVG-D3 (100%->0%). LAD 100%, CFX 90%, RCA 100%; LIMA-LAD OK, SVG-D1 100%, SVG-RCA OK  . Back surgery    . Hernia repair    . Dccv    . Cataract extraction      bilateral  . Radiology with anesthesia N/A 12/13/2012    Procedure: RADIOLOGY WITH ANESTHESIA;  Surgeon: Oneal Grout, MD;  Location: [facility] OR;  Service: Radiology;  Laterality: N/A;    Allergies/Intolerances No Known Allergies  Current Home Medications Current Outpatient Prescriptions  Medication Sig Dispense Refill  . carvedilol (COREG) 6.25 MG tablet Take  1 tablet (6.25 mg total) by mouth 2 (two) times daily with a meal.  60 tablet  3  . Cholecalciferol (VITAMIN D-3) 5000 UNITS TABS Take 1 tablet by mouth daily.       . colchicine 0.6 MG tablet Take 0.6 mg by mouth daily as needed. For gout      . doxazosin (CARDURA) 4 MG tablet Take 1 tablet (4 mg total) by mouth at bedtime.  90 tablet  3  . famotidine (PEPCID) 10 MG tablet Take 10 mg by mouth every morning. For acid      . febuxostat (ULORIC) 40 MG tablet Take 40 mg by mouth daily as needed. For gout      . feeding supplement (RESOURCE BREEZE) LIQD Take 1  Container by mouth 3 (three) times daily between meals.    0  . Ferrous Gluconate (IRON) 240 (27 FE) MG TABS Take 1 tablet by mouth daily.      . fish oil-omega-3 fatty acids 1000 MG capsule Take 1 g by mouth daily.       . hydrALAZINE (APRESOLINE) 10 MG tablet Take 1.5 tablets (15 mg total) by mouth 3 (three) times daily.  135 tablet  3  . hydroxychloroquine (PLAQUENIL) 200 MG tablet Take 200 mg by mouth 2 (two) times daily.       . isosorbide mononitrate (IMDUR) 30 MG 24 hr tablet Take 1 tablet (30 mg total) by mouth daily.  30 tablet  3  . levalbuterol (XOPENEX) 0.63 MG/3ML nebulizer solution Take 3 mLs (0.63 mg total) by nebulization every 4 (four) hours as needed for wheezing.  3 mL  12  . potassium chloride SA (K-DUR,KLOR-CON) 20 MEQ tablet Take 2 tablets (40 mEq total) by mouth daily.  180 tablet  3  . rosuvastatin (CRESTOR) 10 MG tablet Take 10 mg by mouth every evening.      . torsemide (DEMADEX) 20 MG tablet Take 2 tablets (40 mg total) by mouth 2 (two) times daily.  120 tablet  1  . VENTOLIN HFA 108 (90 BASE) MCG/ACT inhaler as needed.      . warfarin (COUMADIN) 5 MG tablet Take by mouth. 5 mg on Mondays and Fridays and 2.5 mg Tues, Wed,Thur, Satur, Sunday       No current facility-administered medications for this visit.    Social History Social History  . Marital Status: Married   Occupational History  . Retired    Social History Main Topics  . Smoking status: Never Smoker   . Smokeless tobacco: Never Used  . Alcohol Use: No  . Drug Use: No   Review of Systems General: No chills, fever, night sweats or weight changes Cardiovascular: No chest pain, dyspnea on exertion, edema, orthopnea, palpitations, paroxysmal nocturnal dyspnea Dermatological: No rash, lesions or masses Respiratory: No cough, dyspnea Urologic: No hematuria, dysuria Abdominal: No nausea, vomiting, diarrhea, bright red blood per rectum, melena, or hematemesis Neurologic: No visual changes, weakness,  changes in mental status All other systems reviewed and are otherwise negative except as noted above.  Physical Exam Vitals: Blood pressure 130/70, pulse 85, height 5\' 7"  (1.702 m), weight 239 lb (108.41 kg), SpO2 97.00%.  General: Well developed, well appearing 79 y.o. male in no acute distress. HEENT: Normocephalic, atraumatic. EOMs intact. Sclera nonicteric. Oropharynx clear.  Neck: Supple without bruits. No JVD. Lungs: Respirations regular and unlabored, CTA bilaterally. No wheezes, rales or rhonchi. Heart: S1, S2 present. II/VI systolic murmur at RUSB. No rub, S3 or S4. Abdomen:  Soft, non-distended.   Extremities: No clubbing, cyanosis or edema. PT/Radials 2+ and equal bilaterally. Psych: Normal affect. Neuro: Alert and oriented X 3. Moves all extremities spontaneously.   Diagnostics Echocardiogram 01/16/2013 Study Conclusions - Left ventricle: The cavity size was mildly dilated. Wall thickness was increased in a pattern of mild LVH. Systolic function was severely reduced. The estimated ejection fraction was in the range of 25% to 30%. Diffuse hypokinesis. Doppler parameters are consistent with high ventricular filling pressure. - Aortic valve: There was moderate to severe stenosis. Trivial regurgitation. Valve area: 0.99cm^2(VTI). Valve area: 0.84cm^2 (Vmax). - Mitral valve: Calcified annulus. Moderate regurgitation. - Left atrium: The atrium was moderately dilated. - Right ventricle: The cavity size was mildly dilated. Systolic function was mildly reduced. - Right atrium: The atrium was moderately dilated. - Pulmonary arteries: Systolic pressure was mildly increased. PA peak pressure: 40mm Hg (S). Impressions: - Aortic valve calcified with fusion of left and right coronary cusps; moderate to severe AS.  BMET     Component Value Date/Time   NA 141 01/27/2013 1036   K 4.0 01/27/2013 1036   CL 104 01/27/2013 1036   CO2 31 01/27/2013 1036   GLUCOSE 104* 01/27/2013 1036   BUN  34* 01/27/2013 1036   CREATININE 2.7* 01/27/2013 1036   CALCIUM 8.9 01/27/2013 1036   GFRNONAA 23* 01/23/2013 0550   GFRAA 27* 01/23/2013 0550   Assessment and Plan 1. Chronic combined systolic and diastolic HF  - EF newly decreased to 25-30% during recent admission  - improved and stable - serum Cr stable and will repeat in one week - continue current medical regimen - counseled regarding low sodium, fluid restricted diet and daily weights - follow-up with Dr. Elease Hashimoto in 4 weeks  2. Paroxysmal atrial fibrillation   - continue rate control - continue warfarin for stroke risk reduction with close follow-up in Coumadin clinic given h/o epistaxis and anemia 3. Moderate to severe AS  - stable by echo during recent admission - continue to follow clinically  4. CAD - stable without anginal symptoms - continue medical therapy 5. CKD - see above; serum Cr stable - repeat BMET in one week  Signed, Eschol Auxier, PA-C 01/28/2013, 10:55 AM

## 2013-01-28 ENCOUNTER — Encounter: Payer: Self-pay | Admitting: Cardiology

## 2013-01-28 ENCOUNTER — Ambulatory Visit (INDEPENDENT_AMBULATORY_CARE_PROVIDER_SITE_OTHER): Payer: Medicare Other | Admitting: Cardiology

## 2013-01-28 VITALS — BP 130/70 | HR 85 | Ht 67.0 in | Wt 239.0 lb

## 2013-01-28 DIAGNOSIS — I4891 Unspecified atrial fibrillation: Secondary | ICD-10-CM

## 2013-01-28 DIAGNOSIS — N259 Disorder resulting from impaired renal tubular function, unspecified: Secondary | ICD-10-CM

## 2013-01-28 DIAGNOSIS — I509 Heart failure, unspecified: Secondary | ICD-10-CM

## 2013-01-28 DIAGNOSIS — I5042 Chronic combined systolic (congestive) and diastolic (congestive) heart failure: Secondary | ICD-10-CM

## 2013-01-28 DIAGNOSIS — I350 Nonrheumatic aortic (valve) stenosis: Secondary | ICD-10-CM

## 2013-01-28 DIAGNOSIS — I2510 Atherosclerotic heart disease of native coronary artery without angina pectoris: Secondary | ICD-10-CM

## 2013-01-28 DIAGNOSIS — I359 Nonrheumatic aortic valve disorder, unspecified: Secondary | ICD-10-CM

## 2013-01-28 NOTE — Patient Instructions (Addendum)
KEEP APPOINTMENT WITH DR. Elease Hashimoto   Your physician recommends that you continue on your current medications as directed. Please refer to the Current Medication list given to you today.  Your physician recommends that you return for lab work in: [employer]

## 2013-02-03 ENCOUNTER — Ambulatory Visit (INDEPENDENT_AMBULATORY_CARE_PROVIDER_SITE_OTHER): Payer: Medicare Other | Admitting: Pharmacist

## 2013-02-03 ENCOUNTER — Other Ambulatory Visit (INDEPENDENT_AMBULATORY_CARE_PROVIDER_SITE_OTHER): Payer: Medicare Other

## 2013-02-03 DIAGNOSIS — N259 Disorder resulting from impaired renal tubular function, unspecified: Secondary | ICD-10-CM

## 2013-02-03 DIAGNOSIS — I4891 Unspecified atrial fibrillation: Secondary | ICD-10-CM

## 2013-02-03 LAB — BASIC METABOLIC PANEL
BUN: 38 mg/dL — ABNORMAL HIGH (ref 6–23)
Calcium: 8.9 mg/dL (ref 8.4–10.5)
GFR: 21.97 mL/min — ABNORMAL LOW (ref >60.00)
Glucose, Bld: 105 mg/dL — ABNORMAL HIGH (ref 70–99)
Potassium: 3.9 mEq/L (ref 3.5–5.1)
Sodium: 141 mEq/L (ref 135–145)

## 2013-02-04 ENCOUNTER — Other Ambulatory Visit: Payer: Medicare Other

## 2013-02-17 ENCOUNTER — Ambulatory Visit (INDEPENDENT_AMBULATORY_CARE_PROVIDER_SITE_OTHER): Payer: Medicare Other | Admitting: *Deleted

## 2013-02-17 DIAGNOSIS — I4891 Unspecified atrial fibrillation: Secondary | ICD-10-CM

## 2013-02-17 LAB — POCT INR: INR: 2.3

## 2013-03-04 ENCOUNTER — Other Ambulatory Visit: Payer: Medicare Other

## 2013-03-04 ENCOUNTER — Ambulatory Visit (INDEPENDENT_AMBULATORY_CARE_PROVIDER_SITE_OTHER): Payer: Medicare Other | Admitting: Cardiovascular Disease

## 2013-03-04 ENCOUNTER — Encounter: Payer: Self-pay | Admitting: Cardiovascular Disease

## 2013-03-04 VITALS — BP 124/80 | HR 80 | Ht 68.0 in | Wt 225.0 lb

## 2013-03-04 DIAGNOSIS — N289 Disorder of kidney and ureter, unspecified: Secondary | ICD-10-CM

## 2013-03-04 DIAGNOSIS — I4891 Unspecified atrial fibrillation: Secondary | ICD-10-CM

## 2013-03-04 DIAGNOSIS — E785 Hyperlipidemia, unspecified: Secondary | ICD-10-CM

## 2013-03-04 LAB — BASIC METABOLIC PANEL
BUN: 37 mg/dL — ABNORMAL HIGH (ref 6–23)
Chloride: 102 mEq/L (ref 96–112)
Creatinine, Ser: 3 mg/dL — ABNORMAL HIGH (ref 0.4–1.5)
GFR: 21.88 mL/min — ABNORMAL LOW (ref >60.00)
Potassium: 3.9 mEq/L (ref 3.5–5.1)

## 2013-03-04 LAB — LIPID PANEL
Cholesterol: 89 mg/dL (ref 0–200)
LDL Cholesterol: 29 mg/dL (ref 0–99)
Triglycerides: 122 mg/dL (ref 0.0–149.0)

## 2013-03-04 LAB — HEPATIC FUNCTION PANEL
ALT: 15 U/L (ref 0–53)
Albumin: 3.9 g/dL (ref 3.5–5.2)
Alkaline Phosphatase: 51 U/L (ref 39–117)
Bilirubin, Direct: 0.1 mg/dL (ref 0.0–0.3)
Total Protein: 6.6 g/dL (ref 6.0–8.3)

## 2013-03-04 MED ORDER — POTASSIUM CHLORIDE CRYS ER 20 MEQ PO TBCR: 20.0000 meq | EXTENDED_RELEASE_TABLET | Freq: Every day | ORAL | Status: DC

## 2013-03-04 MED ORDER — TORSEMIDE 20 MG PO TABS: 40.0000 mg | ORAL_TABLET | Freq: Every day | ORAL | Status: DC

## 2013-03-04 NOTE — Progress Notes (Signed)
Alex Orozco Date of Birth  09-Oct-1929       [facility]    [place] [address]  [address] [place], [place]  [address]   [place], [place]  [address] [phone]     [phone]   Fax  [phone]    Fax [phone]  Problem List: 1. Coronary artery disease- s/p CABG, s/p stenting of SVG to D3. He has stenosis of native LCx. 2. Restrictive lung disease 3. Chronic renal insufficiency 4. Congestive heart failure-ejection fraction of 55-60%  by echo.  There is evidence of diastolic dysfunction. 5. Hypertension 6. Dyslipidemia 7. Moderate aortic stenosis A. Atrial fibrillation 9. History of strep bacteremia 10. History of anemia   History of Present Illness: He was  admitted in April, 2013 with an episode of pneumonia and systolic congestive heart failure.  He was discharge with Levofloxacin, steroid taper, and nebs.  He saw Alex Orozco in his Lasix dose was increased to 60 mg twice a day. He had acute worsening of his renal function with a creatinine up to 2.0. His Lasix was  Held for several doses. Currently he is on a Lasix 60 mg in the AM and 40 mg in the evening.  He seems to be doing well with this dose.  He is feeling OK now.  He complains of generalized fatigue.   He has had 5 injections of"rooster comb"  into his left knee  No angina, breathing is ok.  Wearing his CPAP at night. Using the "[facility]" regularly.  May 19, 20014:  Alex Orozco is doing OK.  He still eats salt.   BP is OK.   Oct. 14, 2014:  Alex Orozco is doing OK.  He was recently in the hospital for CHF.  He has done well - has lost 18 lbs since then.    His leg edema has resolved.  His BMP shows gradual worsening renal function.   Current Outpatient Prescriptions on File Prior to Visit  Medication Sig Dispense Refill  . carvedilol (COREG) 6.25 MG tablet Take 1 tablet (6.25 mg total) by mouth 2 (two) times daily with a meal.  60 tablet  3  . Cholecalciferol  (VITAMIN D-3) 5000 UNITS TABS Take 1 tablet by mouth daily.       . colchicine 0.6 MG tablet Take 0.6 mg by mouth daily as needed. For gout      . doxazosin (CARDURA) 4 MG tablet Take 1 tablet (4 mg total) by mouth at bedtime.  90 tablet  3  . famotidine (PEPCID) 10 MG tablet Take 10 mg by mouth every morning. For acid      . febuxostat (ULORIC) 40 MG tablet Take 40 mg by mouth daily as needed. For gout      . Ferrous Gluconate (IRON) 240 (27 FE) MG TABS Take 1 tablet by mouth daily.      . fish oil-omega-3 fatty acids 1000 MG capsule Take 1 g by mouth daily.       . hydrALAZINE (APRESOLINE) 10 MG tablet Take 1.5 tablets (15 mg total) by mouth 3 (three) times daily.  135 tablet  3  . hydroxychloroquine (PLAQUENIL) 200 MG tablet Take 200 mg by mouth 2 (two) times daily.       . isosorbide mononitrate (IMDUR) 30 MG 24 hr tablet Take 1 tablet (30 mg total) by mouth daily.  30 tablet  3  . levalbuterol (XOPENEX) 0.63 MG/3ML nebulizer solution Take 3  mLs (0.63 mg total) by nebulization every 4 (four) hours as needed for wheezing.  3 mL  12  . potassium chloride SA (K-DUR,KLOR-CON) 20 MEQ tablet Take 2 tablets (40 mEq total) by mouth daily.  180 tablet  3  . rosuvastatin (CRESTOR) 10 MG tablet Take 40 mg by mouth every evening.       . torsemide (DEMADEX) 20 MG tablet Take 2 tablets (40 mg total) by mouth 2 (two) times daily.  120 tablet  1  . VENTOLIN HFA 108 (90 BASE) MCG/ACT inhaler as needed.      . warfarin (COUMADIN) 5 MG tablet Take by mouth. 5 mg on Mondays and Fridays and 2.5 mg Tues, Wed,Thur, Satur, Sunday       No current facility-administered medications on file prior to visit.    No Known Allergies  Past Medical History  Diagnosis Date  . Coronary artery disease     a.  s/p CABG 1990;  b. NSTEMI 9/10:  LHC with S-RCA ok, S-D1 occluded (chronic), S-D3 occluded, L-LAD ok;  PCI:  Endeavour DES to S-D3;  c. 10/2011 Lexiscan MV EF 37%, medium sized fixed defect over basilar to mid lateral  wall.  No ishcemia.   . Lung disease, restrictive   . History of PFTs 01/20/2009  . Pleural effusion   . Chronic renal insufficiency   . CHF (congestive heart failure)     a. Previous EF 40-50%;  b. Echo 08/2011: mild LVH, EF 55-65, grade 3 diast dysfxn. c. EF 25-30% by echo 12/2012.  Marland Kitchen Hypertension   . Dyslipidemia   . Aortic stenosis     a. echo 4/13: mild AS with mean 19 mmHg. b. echo 12/2012: mod-severe AS.  Marland Kitchen PAF (paroxysmal atrial fibrillation)     a. Chronic coumadin  . Anemia   . Difficult intubation   . Arthritis   . Epistaxis     a. 11/2012: adm with severe epistaxis requiring mechanical ventilation for airway protection c/b blood loss anemia  . Mitral regurgitation     a. Mod by echo 12/2012.    Past Surgical History  Procedure Laterality Date  . Coronary artery bypass graft  1999  . Coronary angioplasty with stent placement  2010    DES to the SVG-D3 (100%->0%). LAD 100%, CFX 90%, RCA 100%; LIMA-LAD OK, SVG-D1 100%, SVG-RCA OK  . Back surgery    . Hernia repair    . Dccv    . Cataract extraction      bilateral  . Radiology with anesthesia N/A 12/13/2012    Procedure: RADIOLOGY WITH ANESTHESIA;  Surgeon: Oneal Grout, MD;  Location: [facility] OR;  Service: Radiology;  Laterality: N/A;    History  Smoking status  . Never Smoker   Smokeless tobacco  . Never Used    History  Alcohol Use No    Family History  Problem Relation Age of Onset  . Heart failure Mother   . Heart attack Father   . Hypertension Father     Reviw of Systems:  Reviewed in the HPI.  All other systems are negative.  Physical Exam: Blood pressure 124/80, pulse 80, height 5\' 8"  (1.727 m), weight 225 lb (102.059 kg). General: Well developed, well nourished, in no acute distress.  Head: Normocephalic, atraumatic, sclera non-icteric, mucus membranes are moist,   Neck: Supple. Carotids are 2 + without bruits. No JVD  Lungs: Clear bilaterally to auscultation.  Heart: regular rate.  normal   S1 S2. Soft systolic  murmur  Abdomen: Soft, non-tender, non-distended with normal bowel sounds. No hepatomegaly. No rebound/guarding. No masses.  Msk:  Strength and tone are normal  Extremities: No clubbing or cyanosis. No significant  leg edema.  Unchanged from previous exams Skin:  Slightly decreased skin turgur.  Neuro: Alert and oriented X 3. Moves all extremities spontaneously.  Psych:  Responds to questions appropriately with a normal affect.  ECG: Oct 07, 2012:  NSR at 59.  Poor R wave progression.  Assessment / Plan:

## 2013-03-04 NOTE — Patient Instructions (Addendum)
Your physician recommends that you return for a FASTING lipid profile: today bmet lipid liver  1//Your physician recommends that you return for lab work in: 3 weeks/ bmet  2///You have been referred to nephrology for worsening renal insufficiency   3//Your physician recommends that you schedule a follow-up appointment in: 3 months with Dr Daivd Council nov 17 app  Your physician has recommended you make the following change in your medication:  Decrease kdur to 20 meq daily Reduce demadex to 40 mg daily

## 2013-03-10 ENCOUNTER — Ambulatory Visit (INDEPENDENT_AMBULATORY_CARE_PROVIDER_SITE_OTHER): Payer: Medicare Other | Admitting: General Practice

## 2013-03-10 DIAGNOSIS — I4891 Unspecified atrial fibrillation: Secondary | ICD-10-CM

## 2013-03-10 LAB — POCT INR: INR: 2.7

## 2013-03-21 ENCOUNTER — Telehealth: Payer: Self-pay | Admitting: *Deleted

## 2013-03-21 NOTE — Telephone Encounter (Signed)
Per Bjorn Loser at [facility], Mr.Walling is a level 2 and should receive an appointment by December. Patient is aware.

## 2013-03-21 NOTE — Telephone Encounter (Signed)
Noted/ I will forward to Dr Elease Hashimoto to update him.

## 2013-03-26 ENCOUNTER — Other Ambulatory Visit (INDEPENDENT_AMBULATORY_CARE_PROVIDER_SITE_OTHER): Payer: Medicare Other

## 2013-03-26 DIAGNOSIS — I4891 Unspecified atrial fibrillation: Secondary | ICD-10-CM

## 2013-03-26 DIAGNOSIS — N289 Disorder of kidney and ureter, unspecified: Secondary | ICD-10-CM

## 2013-03-26 DIAGNOSIS — E785 Hyperlipidemia, unspecified: Secondary | ICD-10-CM

## 2013-03-26 LAB — BASIC METABOLIC PANEL
CO2: 29 mEq/L (ref 19–32)
Chloride: 103 mEq/L (ref 96–112)
Glucose, Bld: 94 mg/dL (ref 70–99)
Potassium: 3.6 mEq/L (ref 3.5–5.1)

## 2013-04-03 ENCOUNTER — Other Ambulatory Visit: Payer: Self-pay

## 2013-04-03 DIAGNOSIS — N289 Disorder of kidney and ureter, unspecified: Secondary | ICD-10-CM

## 2013-04-03 DIAGNOSIS — E785 Hyperlipidemia, unspecified: Secondary | ICD-10-CM

## 2013-04-03 DIAGNOSIS — I4891 Unspecified atrial fibrillation: Secondary | ICD-10-CM

## 2013-04-03 MED ORDER — TORSEMIDE 20 MG PO TABS: 40.0000 mg | ORAL_TABLET | Freq: Every day | ORAL | Status: DC

## 2013-04-07 ENCOUNTER — Ambulatory Visit (INDEPENDENT_AMBULATORY_CARE_PROVIDER_SITE_OTHER): Payer: Medicare Other | Admitting: Pharmacist

## 2013-04-07 ENCOUNTER — Ambulatory Visit: Payer: Medicare Other | Admitting: Cardiovascular Disease

## 2013-04-07 ENCOUNTER — Other Ambulatory Visit: Payer: Medicare Other

## 2013-04-07 ENCOUNTER — Telehealth: Payer: Self-pay | Admitting: Cardiovascular Disease

## 2013-04-07 DIAGNOSIS — I4891 Unspecified atrial fibrillation: Secondary | ICD-10-CM

## 2013-04-07 LAB — POCT INR: INR: 4.2

## 2013-04-07 NOTE — Telephone Encounter (Signed)
I will forward to Coumadin clinic.

## 2013-04-07 NOTE — Telephone Encounter (Signed)
FYI  Patient came in this morning for his labs, they told him in the lab to call back and let them know what type of antibiotic they put him on. His PCP put him on Cefuroxime axetil 500 mg for bronchitis . Any questions you can call the patients wife back.

## 2013-04-07 NOTE — Telephone Encounter (Signed)
Spoke with pt's wife.  Cefuroxime okay with Coumadin.  Will continue dosing schedule as discussed at visit.

## 2013-04-11 ENCOUNTER — Other Ambulatory Visit: Payer: Self-pay | Admitting: Nephrology

## 2013-04-15 ENCOUNTER — Ambulatory Visit
Admission: RE | Admit: 2013-04-15 | Discharge: 2013-04-15 | Disposition: A | Discharge status: Home / Self Care | Payer: Medicare Other | Source: Ambulatory Visit | Attending: Nephrology | Admitting: Nephrology

## 2013-04-16 ENCOUNTER — Other Ambulatory Visit: Payer: Self-pay

## 2013-04-16 MED ORDER — ROSUVASTATIN CALCIUM 10 MG PO TABS: 40.0000 mg | ORAL_TABLET | Freq: Every evening | ORAL | Status: DC

## 2013-04-22 ENCOUNTER — Telehealth: Payer: Self-pay | Admitting: *Deleted

## 2013-04-22 DIAGNOSIS — E785 Hyperlipidemia, unspecified: Secondary | ICD-10-CM

## 2013-04-22 MED ORDER — ROSUVASTATIN CALCIUM 10 MG PO TABS: 10.0000 mg | ORAL_TABLET | Freq: Every evening | ORAL | Status: DC

## 2013-04-22 NOTE — Telephone Encounter (Signed)
Will route message to Alex Orozco to get labs ordered, medication order sent to pharmacy with explanation.

## 2013-04-22 NOTE — Telephone Encounter (Signed)
Message copied by Carmela Hurt on Tue Apr 22, 2013  7:37 AM ------      Message from: Vesta Mixer      Created: Mon Apr 21, 2013  6:08 PM      Regarding: RE: PHARMACY QUESTION       I only know what the chart says.  Lets go with 10 mg and check labs in 3 months ( lipids, liver enz. BMP)  We can go up from there if needed.       ----- Message -----         From: Carmela Hurt, RN         Sent: 04/21/2013  11:17 AM           To: Vesta Mixer, MD, Carmela Hurt, RN      Subject: PHARMACY QUESTION                                        Dr Elease Hashimoto,      Patients pharmacy called regarding crestor, patient states he has always taken crestor 10 mg but it was filled at 40 mg, on your note its 40mg  (10/14), I cant find anywhere looking at the chart exactly when it was changed, what dose do you want him on?      Thanks,       Addison Lank, RN       ------

## 2013-04-22 NOTE — Addendum Note (Signed)
Addended by: Antony Odea on: 04/22/2013 11:52 AM   Modules accepted: Orders

## 2013-04-22 NOTE — Telephone Encounter (Signed)
Pt was given lab date to be fasting.

## 2013-04-23 ENCOUNTER — Telehealth: Payer: Self-pay

## 2013-04-23 NOTE — Telephone Encounter (Signed)
Pharm called to verify patient's crestor if it was 10 mg or 40 mg and by the notes in epic it is suppose to be 10 mg daily not 40 mg

## 2013-04-28 ENCOUNTER — Ambulatory Visit (INDEPENDENT_AMBULATORY_CARE_PROVIDER_SITE_OTHER): Payer: Medicare Other | Admitting: *Deleted

## 2013-04-28 DIAGNOSIS — I4891 Unspecified atrial fibrillation: Secondary | ICD-10-CM

## 2013-05-06 ENCOUNTER — Other Ambulatory Visit: Payer: Self-pay

## 2013-05-06 MED ORDER — CARVEDILOL 6.25 MG PO TABS: 6.2500 mg | ORAL_TABLET | Freq: Two times a day (BID) | ORAL | Status: DC

## 2013-05-12 ENCOUNTER — Encounter (INDEPENDENT_AMBULATORY_CARE_PROVIDER_SITE_OTHER): Payer: Self-pay

## 2013-05-12 ENCOUNTER — Ambulatory Visit (INDEPENDENT_AMBULATORY_CARE_PROVIDER_SITE_OTHER)
Admission: RE | Admit: 2013-05-12 | Discharge: 2013-05-12 | Disposition: A | Discharge status: Home / Self Care | Payer: Medicare Other | Source: Ambulatory Visit | Attending: Pulmonary Disease | Admitting: Pulmonary Disease

## 2013-05-12 ENCOUNTER — Ambulatory Visit (INDEPENDENT_AMBULATORY_CARE_PROVIDER_SITE_OTHER): Payer: Medicare Other | Admitting: Pulmonary Disease

## 2013-05-12 ENCOUNTER — Encounter: Payer: Self-pay | Admitting: Pulmonary Disease

## 2013-05-12 ENCOUNTER — Other Ambulatory Visit: Payer: Self-pay

## 2013-05-12 VITALS — BP 124/80 | HR 87 | Temp 97.5°F | Ht 66.0 in | Wt 240.0 lb

## 2013-05-12 DIAGNOSIS — R0600 Dyspnea, unspecified: Secondary | ICD-10-CM

## 2013-05-12 DIAGNOSIS — R0609 Other forms of dyspnea: Secondary | ICD-10-CM

## 2013-05-12 DIAGNOSIS — G4733 Obstructive sleep apnea (adult) (pediatric): Secondary | ICD-10-CM

## 2013-05-12 MED ORDER — ISOSORBIDE MONONITRATE ER 30 MG PO TB24: 30.0000 mg | ORAL_TABLET | Freq: Every day | ORAL | Status: DC

## 2013-05-12 MED ORDER — HYDRALAZINE HCL 10 MG PO TABS: 15.0000 mg | ORAL_TABLET | Freq: Three times a day (TID) | ORAL | Status: DC

## 2013-05-12 NOTE — Patient Instructions (Signed)
Will check a chest xray today, and call you with results. Would not do anything exertional until your cardiology apptm in 2 weeks.  If you feel your breathing is getting worse, please give your cardiologist a call. Keep followup with me for your sleep apnea.

## 2013-05-12 NOTE — Assessment & Plan Note (Signed)
The patient is having progressive dyspnea on exertion that I suspect is secondary to his severe underlying cardiac disease. He was just in the hospital for congestive heart failure, and found to have an ejection fraction of 25%, moderate to severe aortic stenosis, atrial fibrillation, and significant chronic renal failure. He tells me that his weight has been stable, and he has not had worsening lower extremity edema, but this may simply be insufficient flow through his cardiopulmonary circuit to maintain his level of physical activity. I really do not see anything acute from a pulmonary standpoint the day. He has had pulmonary function studies in the past with no airflow obstruction, but he does have some restriction related to his obesity. He is on chronic Coumadin therapy with a therapeutic INR, and therefore unlikely to have thromboembolic disease. He has excellent oxygen saturations today after walking from the waiting room and at rest. He did have an episode of bronchitis a few weeks ago that has since resolved.

## 2013-05-12 NOTE — Progress Notes (Signed)
   Subjective:    Patient ID: Alex Orozco, male    DOB: 11-Jul-1929, 79 y.o.   MRN: [id number]  HPI The patient comes in today for a self-referral for dyspnea on exertion. He has not been seen since 2010 for breathing issues, and was in the hospital the beginning of September with decompensated congestive heart failure. He was found to have an ejection fraction of 25%, moderate to severe aortic stenosis, as well as paroxysmal atrial fibrillation. The patient also has significant chronic renal insufficiency. A chest x-ray during that admission showed cardiomegaly and interstitial edema. The patient states that he has never returned to baseline since discharge, and has had progressive shortness of breath. He will get winded with walking of any kind, but does not feel short of breath at rest. He has not had worsening lower extremity edema, and his weight has been stable. He did have an episode of bronchitis a few weeks ago, and was treated with an antibiotic with resolution of his symptoms. He currently has no cough or mucus production. The patient has not had a recent chest x-ray. He has known severe sleep apnea, and has been compliant with his CPAP therapy.   Review of Systems  Constitutional: Negative for fever and unexpected weight change.  HENT: Negative for congestion, dental problem, ear pain, nosebleeds, postnasal drip, rhinorrhea, sinus pressure, sneezing, sore throat and trouble swallowing.   Eyes: Negative for redness and itching.  Respiratory: Positive for shortness of breath. Negative for cough, chest tightness and wheezing.   Cardiovascular: Negative for palpitations and leg swelling.  Gastrointestinal: Negative for nausea and vomiting.  Genitourinary: Negative for dysuria.  Musculoskeletal: Negative for joint swelling.  Skin: Negative for rash.  Neurological: Negative for headaches.  Hematological: Does not bruise/bleed easily.  Psychiatric/Behavioral: Negative for dysphoric mood. The  patient is not nervous/anxious.        Objective:   Physical Exam Constitutional:  Obese male, no acute distress  HENT:  Nares patent without discharge  Oropharynx without exudate, palate and uvula are normal  Eyes:  Perrla, eomi, no scleral icterus  Neck:  No JVD, no TMG  Cardiovascular:  Normal rate, irregular rhythm, no rubs or gallops. 3/6 rumble        Intact distal pulses but decreased.   Pulmonary :  Normal breath sounds, no stridor or respiratory distress   No rhonchi, or wheezing.  +basilar crackles.   Abdominal:  Soft, nondistended, bowel sounds present.  No tenderness noted.   Musculoskeletal:  1+ lower extremity edema noted.  Lymph Nodes:  No cervical lymphadenopathy noted  Skin:  No cyanosis noted  Neurologic:  Alert, appropriate, moves all 4 extremities without obvious deficit.         Assessment & Plan:

## 2013-05-13 ENCOUNTER — Ambulatory Visit (INDEPENDENT_AMBULATORY_CARE_PROVIDER_SITE_OTHER): Payer: Medicare Other | Admitting: *Deleted

## 2013-05-13 DIAGNOSIS — I4891 Unspecified atrial fibrillation: Secondary | ICD-10-CM

## 2013-05-13 LAB — POCT INR: INR: 2.7

## 2013-05-28 ENCOUNTER — Encounter: Payer: Self-pay | Admitting: Physician Assistant

## 2013-05-28 ENCOUNTER — Ambulatory Visit: Payer: Medicare Other | Admitting: Physician Assistant

## 2013-05-28 ENCOUNTER — Ambulatory Visit (INDEPENDENT_AMBULATORY_CARE_PROVIDER_SITE_OTHER): Payer: Medicare Other | Admitting: Physician Assistant

## 2013-05-28 ENCOUNTER — Ambulatory Visit (INDEPENDENT_AMBULATORY_CARE_PROVIDER_SITE_OTHER): Payer: Medicare Other | Admitting: Pharmacist

## 2013-05-28 VITALS — BP 140/75 | HR 80 | Ht 67.0 in | Wt 244.0 lb

## 2013-05-28 DIAGNOSIS — I4891 Unspecified atrial fibrillation: Secondary | ICD-10-CM

## 2013-05-28 DIAGNOSIS — I10 Essential (primary) hypertension: Secondary | ICD-10-CM

## 2013-05-28 DIAGNOSIS — R0602 Shortness of breath: Secondary | ICD-10-CM

## 2013-05-28 DIAGNOSIS — N289 Disorder of kidney and ureter, unspecified: Secondary | ICD-10-CM

## 2013-05-28 DIAGNOSIS — I5023 Acute on chronic systolic (congestive) heart failure: Secondary | ICD-10-CM

## 2013-05-28 DIAGNOSIS — I359 Nonrheumatic aortic valve disorder, unspecified: Secondary | ICD-10-CM

## 2013-05-28 DIAGNOSIS — Z8679 Personal history of other diseases of the circulatory system: Secondary | ICD-10-CM

## 2013-05-28 DIAGNOSIS — E785 Hyperlipidemia, unspecified: Secondary | ICD-10-CM

## 2013-05-28 DIAGNOSIS — Z951 Presence of aortocoronary bypass graft: Secondary | ICD-10-CM

## 2013-05-28 LAB — BASIC METABOLIC PANEL
BUN: 48 mg/dL — ABNORMAL HIGH (ref 6–23)
CHLORIDE: 105 meq/L (ref 96–112)
CO2: 29 mEq/L (ref 19–32)
Calcium: 8.8 mg/dL (ref 8.4–10.5)
Creatinine, Ser: 3.8 mg/dL — ABNORMAL HIGH (ref 0.4–1.5)
GFR: 16.42 mL/min — AB (ref >60.00)
Glucose, Bld: 94 mg/dL (ref 70–99)
POTASSIUM: 4.6 meq/L (ref 3.5–5.1)
SODIUM: 144 meq/L (ref 135–145)

## 2013-05-28 LAB — POCT INR: INR: 3.3

## 2013-05-28 LAB — BRAIN NATRIURETIC PEPTIDE: Pro B Natriuretic peptide (BNP): 1246 pg/mL — ABNORMAL HIGH (ref 0.0–100.0)

## 2013-05-28 MED ORDER — TORSEMIDE 20 MG PO TABS: 40.0000 mg | ORAL_TABLET | Freq: Two times a day (BID) | ORAL | Status: DC

## 2013-05-28 MED ORDER — METOLAZONE 2.5 MG PO TABS: ORAL_TABLET | ORAL | Status: AC

## 2013-05-28 NOTE — Assessment & Plan Note (Signed)
Blood Pressure is stable 

## 2013-05-28 NOTE — Patient Instructions (Addendum)
Your physician has recommended you make the following change in your medication:   1. Start Zaroxolyn 2.5 mg, take 1 tablet today and then take only as directed  Your physician recommends that you return for lab work today for Bmet and BNP.  Your physician recommends that you schedule a follow-up appointment next week with Tereso NewcomerScott Weaver, PA.  Your physician recommends that you schedule a follow-up appointment in 2 weeks with Jacolyn ReedyMichele Lenze, PA.  You are to call Friday 05/31/13 and let Dr. Elease HashimotoNahser know your weight and how you are feeling.   Follow up with Dr. Lowell GuitarPowell tomorrow at 1:30pm.

## 2013-05-28 NOTE — Assessment & Plan Note (Signed)
Heart rate controlled and on Coumadin.

## 2013-05-28 NOTE — Assessment & Plan Note (Signed)
Patient has significant fluid on board and is very uncomfortable. He has not responded to increasing his torsemide as an outpatient. He does have stage IV chronic kidney disease with a creatinine of 3.0 in November. I will give him a dose of Zaroxolyn 2.5 mg once today we have scheduled him to see Dr. Lowell GuitarPowell tomorrow. If he does not diurese or his kidneys have worsened he may need hospitalization for IV diuresis. I asked his wife to call us on Friday. He has not improved hospitalization is recommended. He will see Tereso NewcomerScott Weaver next week and myself the following week in followup. Labs are ordered for today.

## 2013-05-28 NOTE — Progress Notes (Signed)
HPI:  This is a very pleasant 80 year old male patient of Dr.Nahser who has history of CAD status post CABG, status post and a STEMI with PCI with drug-eluting stent to the SVG-D3 in 2010, restrictive lung disease, chronic kidney disease, systolic and diastolic heart failure, hypertension, moderate to severe aortic stenosis, chronic atrial fibrillation, and obstructive sleep apnea. 2-D echo in August 2014 EF was 25-30% and he had moderate to severe left ear and moderate MR. His EF had previously been 55-65% in April of 2013.  The patient comes in today complaining of several week history of progressive shortness of breath swelling and weight gain. His wife increased his torsemide from 40 mg daily to 60 mg daily. When this was not working she increased it to 40 mg twice a day. He has continued to gain weight a total of about 5 pounds but has abdominal swelling significant dyspnea on exertion and loss of appetite. He saw Dr. Shelle Ironlance 12/23 showing pulmonary vascular prominence consistent with compensated CHF. No pneumonia. The patient is very uncomfortable today and has significant dyspnea on exertion. He sees Dr. Lowell GuitarPowell for his renal disease his last creatinine was 3.0.  No Known Allergies  Current Outpatient Prescriptions on File Prior to Visit: carvedilol (COREG) 6.25 MG tablet, Take 1 tablet (6.25 mg total) by mouth 2 (two) times daily with a meal., Disp: 60 tablet, Rfl: 3 Cholecalciferol (VITAMIN D-3) 5000 UNITS TABS, Take 1 tablet by mouth daily. , Disp: , Rfl:  doxazosin (CARDURA) 4 MG tablet, Take 1 tablet (4 mg total) by mouth at bedtime., Disp: 90 tablet, Rfl: 3 famotidine (PEPCID) 10 MG tablet, Take 10 mg by mouth every morning. For acid, Disp: , Rfl:  febuxostat (ULORIC) 40 MG tablet, Take 40 mg by mouth every other day. For gout, Disp: , Rfl:  Ferrous Gluconate (IRON) 240 (27 FE) MG TABS, Take 1 tablet by mouth daily., Disp: , Rfl:  fish oil-omega-3 fatty acids 1000 MG capsule, Take 1 g by  mouth daily. , Disp: , Rfl:  hydrALAZINE (APRESOLINE) 10 MG tablet, Take 1.5 tablets (15 mg total) by mouth 3 (three) times daily., Disp: 135 tablet, Rfl: 0 hydroxychloroquine (PLAQUENIL) 200 MG tablet, Take 200 mg by mouth 2 (two) times daily. , Disp: , Rfl:  isosorbide mononitrate (IMDUR) 30 MG 24 hr tablet, Take 1 tablet (30 mg total) by mouth daily., Disp: 30 tablet, Rfl: 0 levalbuterol (XOPENEX) 0.63 MG/3ML nebulizer solution, Take 3 mLs (0.63 mg total) by nebulization every 4 (four) hours as needed for wheezing., Disp: 3 mL, Rfl: 12 potassium chloride SA (K-DUR,KLOR-CON) 20 MEQ tablet, Take 1 tablet (20 mEq total) by mouth daily., Disp: 90 tablet, Rfl: 3 rosuvastatin (CRESTOR) 10 MG tablet, Take 1 tablet (10 mg total) by mouth every evening., Disp: 303 tablet, Rfl: 3 VENTOLIN HFA 108 (90 BASE) MCG/ACT inhaler, as needed., Disp: , Rfl:  warfarin (COUMADIN) 5 MG tablet, Take 2.5 mg by mouth daily. , Disp: , Rfl:    No current facility-administered medications on file prior to visit.   Past Medical History:   Coronary artery disease                                        Comment:a.  s/p CABG 1990;  b. NSTEMI 9/10:  LHC with               S-RCA ok, S-D1 occluded (chronic), S-D3  occluded, L-LAD ok;  PCI:  Endeavour DES to               S-D3;  c. 10/2011 Lexiscan MV EF 37%, medium               sized fixed defect over basilar to mid lateral               wall.  No ishcemia.    Lung disease, restrictive                                    History of PFTs                                 01/20/2009     Pleural effusion                                             Chronic renal insufficiency                                  CHF (congestive heart failure)                                 Comment:a. Previous EF 40-50%;  b. Echo 08/2011: mild               LVH, EF 55-65, grade 3 diast dysfxn. c. EF               25-30% by echo 12/2012.   Hypertension                                                  Dyslipidemia                                                 Aortic stenosis                                                Comment:a. echo 4/13: mild AS with mean 19 mmHg. b.               echo 12/2012: mod-severe AS.   PAF (paroxysmal atrial fibrillation)                           Comment:a. Chronic coumadin   Anemia                                                       DIFFICULT INTUBATION  Arthritis                                                    Epistaxis                                                      Comment:a. 11/2012: adm with severe epistaxis requiring               mechanical ventilation for airway protection               c/b blood loss anemia   Mitral regurgitation                                           Comment:a. Mod by echo 12/2012.  Past Surgical History:   CORONARY ARTERY BYPASS GRAFT                     1999         CORONARY ANGIOPLASTY WITH STENT PLACEMENT        2010           Comment:DES to the SVG-D3 (100%->0%). LAD 100%, CFX               90%, RCA 100%; LIMA-LAD OK, SVG-D1 100%,               SVG-RCA OK   BACK SURGERY                                                  HERNIA REPAIR                                                 dccv                                                          CATARACT EXTRACTION                                             Comment:bilateral   RADIOLOGY WITH ANESTHESIA                       N/A 12/13/2012      Comment:Procedure: RADIOLOGY WITH ANESTHESIA;  Surgeon:              Oneal Grout, MD;  Location: [facility] OR;                Service: Radiology;  Laterality: N/A;  Review of  patient's family history indicates:   Heart failure                  Mother                   Heart attack                   Father                   Hypertension                   Father                   Social History   Marital Status: Married             Spouse Name:                       Years of Education:                 Number of children:             Occupational History Occupation          Associate Professor            Comment              Retired                                   Social History Main Topics   Smoking Status: Never Smoker                     Smokeless Status: Never Used                       Alcohol Use: No             Drug Use: No             Sexual Activity: Not Currently      Other Topics            Concern   None on file  Social History Narrative   None on file    ROS: Anorexia and nausea, unable to eat much. Otherwise see history of present illness   PHYSICAL EXAM: Obese, short of breath. Neck: No JVD, HJR, Bruit, or thyroid enlargement  Lungs: Decreased breath sounds with bibasilar crackles   Cardiovascular:  irregular irregular at 80 beats per minute 2/6 systolic murmur at the left sternal border, no gallops, bruit, thrill, or heave.  Abdomen: Obese, some swelling BS normal.   Extremities: +2-3 edema bilaterally decreased distal pulses  SKin: Warm, no lesions or rashes   Musculoskeletal: No deformities  Neuro: no focal signs  BP 140/75  Pulse 80  Ht 5\' 7"  (1.702 m)  Wt 244 lb (110.678 kg)  BMI 38.21 kg/m2   EKG: Atrial fibrillation with nonspecific T wave abnormality, no acute change

## 2013-05-28 NOTE — Assessment & Plan Note (Signed)
No complaints of chest pain

## 2013-05-29 ENCOUNTER — Telehealth: Payer: Self-pay | Admitting: Cardiology

## 2013-05-29 ENCOUNTER — Ambulatory Visit: Payer: Medicare Other | Admitting: Cardiovascular Disease

## 2013-05-29 NOTE — Telephone Encounter (Signed)
Jacolyn ReedyMichele Lenze, PA was notified yesterday afternoon of pts BNP and Bmet results. Pt is aware to follow up with Dr. Lowell GuitarPowell today for CKD and labs and OV were faxed yesterday. Pts wife aware. Dr. Lowell GuitarPowell should follow heart failure medications. Pt will be seeing PA Tereso NewcomerScott Weaver next week.

## 2013-05-30 ENCOUNTER — Telehealth: Payer: Self-pay | Admitting: Cardiovascular Disease

## 2013-05-30 NOTE — Telephone Encounter (Signed)
Patient is returning your call from yesterday, please return call when you get a chance.

## 2013-05-30 NOTE — Telephone Encounter (Signed)
Pt wanted to update us, pt was seen by Dr Lowell GuitarPowell and his meds were changed. Zaroxolyn 2.5 mg 1/8,1/9, 1/10 then prn Torsemide 60 mg in am 40 mg pm. Wt down 5 lb and is feeling better. Pt to have f/u with PA Alben SpittleWeaver next week.

## 2013-06-04 ENCOUNTER — Encounter: Payer: Self-pay | Admitting: Physician Assistant

## 2013-06-04 ENCOUNTER — Ambulatory Visit (INDEPENDENT_AMBULATORY_CARE_PROVIDER_SITE_OTHER): Payer: Medicare Other | Admitting: Physician Assistant

## 2013-06-04 VITALS — BP 122/70 | HR 75 | Ht 67.0 in | Wt 220.0 lb

## 2013-06-04 DIAGNOSIS — I359 Nonrheumatic aortic valve disorder, unspecified: Secondary | ICD-10-CM

## 2013-06-04 DIAGNOSIS — I509 Heart failure, unspecified: Secondary | ICD-10-CM

## 2013-06-04 DIAGNOSIS — N184 Chronic kidney disease, stage 4 (severe): Secondary | ICD-10-CM

## 2013-06-04 DIAGNOSIS — I350 Nonrheumatic aortic (valve) stenosis: Secondary | ICD-10-CM

## 2013-06-04 DIAGNOSIS — I5022 Chronic systolic (congestive) heart failure: Secondary | ICD-10-CM

## 2013-06-04 DIAGNOSIS — I2510 Atherosclerotic heart disease of native coronary artery without angina pectoris: Secondary | ICD-10-CM

## 2013-06-04 DIAGNOSIS — I4891 Unspecified atrial fibrillation: Secondary | ICD-10-CM

## 2013-06-04 DIAGNOSIS — N289 Disorder of kidney and ureter, unspecified: Secondary | ICD-10-CM

## 2013-06-04 DIAGNOSIS — E785 Hyperlipidemia, unspecified: Secondary | ICD-10-CM

## 2013-06-04 LAB — BASIC METABOLIC PANEL
BUN: 54 mg/dL — ABNORMAL HIGH (ref 6–23)
CHLORIDE: 95 meq/L — AB (ref 96–112)
CO2: 36 meq/L — AB (ref 19–32)
CREATININE: 4 mg/dL — AB (ref 0.4–1.5)
Calcium: 9.7 mg/dL (ref 8.4–10.5)
GFR: 15.47 mL/min — ABNORMAL LOW (ref >60.00)
Glucose, Bld: 81 mg/dL (ref 70–99)
Potassium: 3.2 mEq/L — ABNORMAL LOW (ref 3.5–5.1)
SODIUM: 141 meq/L (ref 135–145)

## 2013-06-04 MED ORDER — TORSEMIDE 20 MG PO TABS: 40.0000 mg | ORAL_TABLET | Freq: Two times a day (BID) | ORAL | Status: DC

## 2013-06-04 NOTE — Patient Instructions (Signed)
DECREASE TORSEMIDE 40 MG TWICE DAILY  LAB WORK; BMET  MAKE SURE TO WEIGH DAILY; ONLY TAKE METOLAZONE IF YOUR WEIGHT IS INCREASE BY 3 LB'S X 1 DAY OR 5 LB'S IN 1 WEEK  KEEP YOUR APPT WITH Jacolyn ReedyMICHELE LENZE, PA 06/11/13 @ 1:45

## 2013-06-04 NOTE — Progress Notes (Signed)
[address] [place], [place]  [address] Phone: [phone] Fax:  [phone]  Date:  06/04/2013   ID:  Alex Orozco, DOB April 03, 1930, MRN [id number]  PCP:  Kaleen Mask, MD  Cardiologist:  Dr. Delane Ginger  Nephrologist:  Dr. Lowell Guitar    History of Present Illness: Alex Orozco is a 80 y.o. male with a hx of CAD, s/p CABG, s/p NSTEMI in 01/2009 treated with a DES to the SVG-D3, restrictive lung disease, CKD, systolic CHF, HTN, HL, aortic stenosis, AFib, OSA, anemia and thrombocytopenia.   Admitted 08/2011 with CHF in the setting of CAP.  Echo 08/25/11:  EF 55-65% and mild AS (Mean gradient 19).  Lexiscan Myoview (10/2011): EF 37% lateral infarct versus soft tissue attenuation, no ischemia.  Admitted to the hospital in 11/2012 with severe epistaxis requiring mechanical ventilation for airway protection c/b blood loss anemia, a/c diastolic CHF and deconditioning.  Admitted with volume excess in 12/2012. Echocardiogram demonstrated moderate to severe stenosis and worsening LV function.  Echo (01/16/2013): Mild LVH, EF 25-30%, diffuse HK, moderate to severe aortic stenosis (mean gradient 16 mm of mercury), moderate MR, moderate LAE, mild RVE, mild reduced RVSF, moderate RAE, PASP 40.  Cardiac catheterization was avoided due to chronic kidney disease and high risk for CIN.  He was not felt to be a good candidate for AVR but may be a candidate for TAVR.  It was not felt that his AS was severe enough to consider corrective surgery.    He was recently seen by Jacolyn Reedy, PA-C on 05/28/2013 with a/c systolic CHF. He was given metolazone. He saw nephrology (Dr. Lowell Guitar).  Metolazone was continued for a total of 3 days. His torsemide was changed to 60 mg in the morning and 40 mg in the evening. He returns for followup.  He is doing much better. His weight is down 24 pounds. His breathing is improved. He is probably NYHA class IIb. He sleeps on 2 pillows chronically. LE edema is much  improved. He denies further cough. He denies wheezing. He denies chest pain. He denies syncope. He has noted some lightheadedness.   Recent Labs: 01/21/2013: Hemoglobin 9.5*  03/04/2013: ALT 15; AST 23; HDL 35.80*; LDL (calc) 29  05/28/2013: BUN 48*; Creatinine 3.8*; POC INR 3.3; Potassium 4.6; Pro B Natriuretic peptide (BNP) 1246.0*; Sodium 144     Wt Readings from Last 3 Encounters:  06/04/13 220 lb (99.791 kg)  05/28/13 244 lb (110.678 kg)  05/12/13 240 lb (108.863 kg)     Past Medical History  Diagnosis Date  . Coronary artery disease     a.  s/p CABG 1990;  b. NSTEMI 9/10:  LHC with S-RCA ok, S-D1 occluded (chronic), S-D3 occluded, L-LAD ok;  PCI:  Endeavour DES to S-D3;  c. 10/2011 Lexiscan MV EF 37%, medium sized fixed defect over basilar to mid lateral wall.  No ishcemia.   . Lung disease, restrictive   . History of PFTs 01/20/2009  . Pleural effusion   . CHF (congestive heart failure)     a. Previous EF 40-50%;  b. Echo 08/2011: mild LVH, EF 55-65, grade 3 diast dysfxn. c. EF 25-30% by echo 12/2012.  Marland Kitchen Hypertension   . Dyslipidemia   . Aortic stenosis     a. echo 4/13: mild AS with mean 19 mmHg. b. echo 12/2012: Mild LVH, EF 25-30%, diffuse HK, moderate to severe aortic stenosis (mean gradient 16 mm of mercury), moderate MR, moderate LAE, mild RVE,  mild reduced RVSF, moderate RAE, PASP 40  . Anemia   . Difficult intubation   . Arthritis   . Epistaxis     a. 11/2012: adm with severe epistaxis requiring mechanical ventilation for airway protection c/b blood loss anemia  . Mitral regurgitation     a. Mod by echo 12/2012.  . CKD (chronic kidney disease), stage IV     Dr. Lowell Guitar  . Permanent atrial fibrillation     a. Chronic coumadin  . Sleep apnea     Past Surgical History  Procedure Laterality Date  . Coronary artery bypass graft  1999  . Coronary angioplasty with stent placement  2010    DES to the SVG-D3 (100%->0%). LAD 100%, CFX 90%, RCA 100%; LIMA-LAD OK, SVG-D1 100%,  SVG-RCA OK  . Back surgery    . Hernia repair    . Dccv    . Cataract extraction      bilateral  . Radiology with anesthesia N/A 12/13/2012    Procedure: RADIOLOGY WITH ANESTHESIA;  Surgeon: Oneal Grout, MD;  Location: [facility] OR;  Service: Radiology;  Laterality: N/A;      Current Outpatient Prescriptions  Medication Sig Dispense Refill  . carvedilol (COREG) 6.25 MG tablet Take 1 tablet (6.25 mg total) by mouth 2 (two) times daily with a meal.  60 tablet  3  . Cholecalciferol (VITAMIN D-3) 5000 UNITS TABS Take 1 tablet by mouth daily.       Marland Kitchen doxazosin (CARDURA) 4 MG tablet Take 1 tablet (4 mg total) by mouth at bedtime.  90 tablet  3  . famotidine (PEPCID) 10 MG tablet Take 10 mg by mouth every morning. For acid      . febuxostat (ULORIC) 40 MG tablet Take 40 mg by mouth every other day. For gout      . Ferrous Gluconate (IRON) 240 (27 FE) MG TABS Take 1 tablet by mouth daily.      . fish oil-omega-3 fatty acids 1000 MG capsule Take 1 g by mouth daily.       . hydrALAZINE (APRESOLINE) 10 MG tablet Take 1.5 tablets (15 mg total) by mouth 3 (three) times daily.  135 tablet  0  . hydroxychloroquine (PLAQUENIL) 200 MG tablet Take 200 mg by mouth 2 (two) times daily.       . isosorbide mononitrate (IMDUR) 30 MG 24 hr tablet Take 1 tablet (30 mg total) by mouth daily.  30 tablet  0  . levalbuterol (XOPENEX) 0.63 MG/3ML nebulizer solution Take 3 mLs (0.63 mg total) by nebulization every 4 (four) hours as needed for wheezing.  3 mL  12  . metolazone (ZAROXOLYN) 2.5 MG tablet 1 tablet today and then only as directed  30 tablet  0  . potassium chloride SA (K-DUR,KLOR-CON) 20 MEQ tablet Take 1 tablet (20 mEq total) by mouth daily.  90 tablet  3  . rosuvastatin (CRESTOR) 10 MG tablet Take 1 tablet (10 mg total) by mouth every evening.  303 tablet  3  . torsemide (DEMADEX) 20 MG tablet Take 2 tablets (40 mg total) by mouth 2 (two) times daily.      . VENTOLIN HFA 108 (90 BASE) MCG/ACT inhaler as  needed.      . warfarin (COUMADIN) 5 MG tablet Take 2.5 mg by mouth daily.        No current facility-administered medications for this visit.    Allergies:   No Known Allergies  Social History:  The patient  reports  that he has never smoked. He has never used smokeless tobacco. He reports that he does not drink alcohol or use illicit drugs.   Family History:  The patient's family history includes Heart attack in his father; Heart failure in his mother; Hypertension in his father.   ROS:  Please see the history of present illness.   All other systems reviewed and negative.   PHYSICAL EXAM: VS:  BP 122/70  Pulse 75  Ht 5\' 7"  (1.702 m)  Wt 220 lb (99.791 kg)  BMI 34.45 kg/m2 Well nourished, well developed, in no acute distress HEENT: normal aside from very mild periorbital edema Neck:  no JVD  Cardiac:  normal S1, S2; irreg irreg rhythm; 2/6 systolic murmur RUSB Lungs:  Clear to auscultation bilaterally, no wheezing, rhonchi or rales   Abd:  Soft, non-tender, no hepatomegaly Ext:  very trace bilateral LE edema  Skin: warm and dry Neuro:  CNs 2-12 intact, no focal abnormalities noted  EKG:  Atrial fibrillation, HR 75, normal axis, nonspecific ST-T wave changes   No change from prior tracing  ASSESSMENT AND PLAN:  1. Chronic Systolic CHF:  Volume is much improved. His weight is down 24 pounds. He is now NYHA class IIb. I will check a basic metabolic panel today. I have asked him to change his torsemide to 40 mg twice a day. We discussed the importance of weighing daily. He will take metolazone 2.5 mg once daily as needed for a weight gain of 3 pounds over 24 hours or 5 pounds over one week. He prefers to keep his follow up next week. 2. Aortic Stenosis: Moderate to severe by echocardiogram in 12/2012. This is not felt to be contributing to his CHF. He will likely need a follow up echocardiogram at some point in the next several months. 3. Chronic Kidney Disease Stage IV: Obtain  followup basic metabolic panel today. Continue follow up with Dr. Lowell GuitarPowell. 4. Atrial Fibrillation:  Rate controlled. He is tolerating Coumadin. 5. CAD:  No angina.  He is not on ASA as he is on Coumadin.  Continue statin.  6. Hypertension: Controlled. 7. Hyperlipidemia: Continue statin. 8. Disposition:  Keep follow up with Jacolyn ReedyMichele Lenze, PA-C on 06/11/13.   Signed, Tereso NewcomerScott Didi Ganaway, PA-C  06/04/2013 12:31 PM

## 2013-06-05 ENCOUNTER — Telehealth: Payer: Self-pay | Admitting: *Deleted

## 2013-06-05 DIAGNOSIS — I5022 Chronic systolic (congestive) heart failure: Secondary | ICD-10-CM

## 2013-06-05 DIAGNOSIS — N259 Disorder resulting from impaired renal tubular function, unspecified: Secondary | ICD-10-CM

## 2013-06-05 DIAGNOSIS — I5023 Acute on chronic systolic (congestive) heart failure: Secondary | ICD-10-CM

## 2013-06-05 NOTE — Telephone Encounter (Signed)
pt notified about lab results and to take extra K+ today only then resume regular dose, decrease torsemide for to 40 mg daily for 2 days then resume 40 bid, bmet 06/11/13 when he sees ML, PA..Marland Kitchen

## 2013-06-11 ENCOUNTER — Ambulatory Visit: Payer: Medicare Other | Admitting: Physician Assistant

## 2013-06-11 ENCOUNTER — Other Ambulatory Visit: Payer: Medicare Other

## 2013-06-16 ENCOUNTER — Ambulatory Visit (INDEPENDENT_AMBULATORY_CARE_PROVIDER_SITE_OTHER): Payer: Medicare Other | Admitting: *Deleted

## 2013-06-16 ENCOUNTER — Other Ambulatory Visit: Payer: Medicare Other

## 2013-06-16 ENCOUNTER — Encounter: Payer: Self-pay | Admitting: Physician Assistant

## 2013-06-16 ENCOUNTER — Ambulatory Visit (INDEPENDENT_AMBULATORY_CARE_PROVIDER_SITE_OTHER): Payer: Medicare Other | Admitting: Physician Assistant

## 2013-06-16 VITALS — BP 114/68 | HR 84 | Ht 66.0 in | Wt 222.0 lb

## 2013-06-16 DIAGNOSIS — I359 Nonrheumatic aortic valve disorder, unspecified: Secondary | ICD-10-CM

## 2013-06-16 DIAGNOSIS — I10 Essential (primary) hypertension: Secondary | ICD-10-CM

## 2013-06-16 DIAGNOSIS — I350 Nonrheumatic aortic (valve) stenosis: Secondary | ICD-10-CM

## 2013-06-16 DIAGNOSIS — I5022 Chronic systolic (congestive) heart failure: Secondary | ICD-10-CM

## 2013-06-16 DIAGNOSIS — I2510 Atherosclerotic heart disease of native coronary artery without angina pectoris: Secondary | ICD-10-CM

## 2013-06-16 DIAGNOSIS — I4891 Unspecified atrial fibrillation: Secondary | ICD-10-CM

## 2013-06-16 DIAGNOSIS — I509 Heart failure, unspecified: Secondary | ICD-10-CM

## 2013-06-16 DIAGNOSIS — Z5181 Encounter for therapeutic drug level monitoring: Secondary | ICD-10-CM

## 2013-06-16 DIAGNOSIS — N184 Chronic kidney disease, stage 4 (severe): Secondary | ICD-10-CM

## 2013-06-16 LAB — POCT INR: INR: 2.3

## 2013-06-16 NOTE — Patient Instructions (Signed)
NO CHANGES WERE MADE TODAY  LAB WORK TODAY; BMET  PLEASE FOLLOW UP WITH DR. Elease HashimotoNAHSER 07/28/13 @ 4 PM

## 2013-06-16 NOTE — Progress Notes (Signed)
[address] [place], [place]  [address] Phone: [phone] Fax:  [phone]  Date:  06/16/2013   ID:  Alex Orozco, DOB [birth year], MRN [id number]  PCP:  Kaleen Mask, MD  Cardiologist:  Dr. Delane Ginger  Nephrologist:  Dr. Lowell Guitar    History of Present Illness: Alex Orozco is a 80 y.o. male with a hx of CAD, s/p CABG, s/p NSTEMI in 01/2009 treated with a DES to the SVG-D3, restrictive lung disease, CKD, systolic CHF, HTN, HL, aortic stenosis, AFib, OSA, anemia and thrombocytopenia.   Admitted 08/2011 with CHF in the setting of CAP.  Echo 08/25/11:  EF 55-65% and mild AS (Mean gradient 19).  Lexiscan Myoview (10/2011): EF 37% lateral infarct versus soft tissue attenuation, no ischemia.  Admitted to the hospital in 11/2012 with severe epistaxis requiring mechanical ventilation for airway protection c/b blood loss anemia, a/c diastolic CHF and deconditioning.  Admitted with volume excess in 12/2012. Echocardiogram demonstrated moderate to severe stenosis and worsening LV function.  Echo (01/16/2013): Mild LVH, EF 25-30%, diffuse HK, moderate to severe aortic stenosis (mean gradient 16 mm of mercury), moderate MR, moderate LAE, mild RVE, mild reduced RVSF, moderate RAE, PASP 40.  Cardiac catheterization was avoided due to CKD and high risk for CIN.  He was not felt to be a good candidate for AVR but may be a candidate for TAVR.  It was not felt that his AS was severe enough to consider corrective surgery.    He had recently been seen for volume overload.  Diuretics were adjusted as an outpatient.  I saw him a couple weeks ago and his weight was down 24 lbs.  Since then, his weights have been stable.  He did have increased weight over 3 pounds on one occasion. This promptly returned to baseline with taking one dose of metolazone. He denies significant change in his dyspnea. He is NYHA class IIb. He sleeps on 2 pillows chronically. He denies LE edema. He denies chest pain. He  denies syncope.   Recent Labs: 01/21/2013: Hemoglobin 9.5*  03/04/2013: ALT 15; AST 23; HDL Cholesterol 35.80*; LDL (calc) 29  05/28/2013: Pro B Natriuretic peptide (BNP) 1246.0*  06/04/2013: BUN 54*; Creatinine 4.0*; Potassium 3.2*; Sodium 141  06/16/2013: POC INR 2.3     Wt Readings from Last 3 Encounters:  06/16/13 222 lb (100.699 kg)  06/04/13 220 lb (99.791 kg)  05/28/13 244 lb (110.678 kg)     Past Medical History  Diagnosis Date  . Coronary artery disease     a.  s/p CABG 1990;  b. NSTEMI 9/10:  LHC with S-RCA ok, S-D1 occluded (chronic), S-D3 occluded, L-LAD ok;  PCI:  Endeavour DES to S-D3;  c. 10/2011 Lexiscan MV EF 37%, medium sized fixed defect over basilar to mid lateral wall.  No ishcemia.   . Lung disease, restrictive   . History of PFTs 01/20/2009  . Pleural effusion   . Hypertension   . Dyslipidemia   . Aortic stenosis     a. echo 4/13: mild AS with mean 19 mmHg. b. echo 12/2012: Mild LVH, EF 25-30%, diffuse HK, moderate to severe aortic stenosis (mean gradient 16 mm of mercury), moderate MR, moderate LAE, mild RVE, mild reduced RVSF, moderate RAE, PASP 40  . Anemia   . Difficult intubation   . Arthritis   . Epistaxis     a. 11/2012: adm with severe epistaxis requiring mechanical ventilation for airway protection c/b blood loss anemia  .  Mitral regurgitation     a. Mod by echo 12/2012.  . CKD (chronic kidney disease), stage IV     Dr. Lowell Guitar  . Permanent atrial fibrillation     a. Chronic coumadin  . Sleep apnea   . Chronic systolic CHF (congestive heart failure)     a. Previous EF 40-50%;  b. Echo 08/2011: mild LVH, EF 55-65, grade 3 diast dysfxn. c. EF 25-30% by echo 12/2012.    Past Surgical History  Procedure Laterality Date  . Coronary artery bypass graft  1999  . Coronary angioplasty with stent placement  2010    DES to the SVG-D3 (100%->0%). LAD 100%, CFX 90%, RCA 100%; LIMA-LAD OK, SVG-D1 100%, SVG-RCA OK  . Back surgery    . Hernia repair    . Dccv     . Cataract extraction      bilateral  . Radiology with anesthesia N/A 12/13/2012    Procedure: RADIOLOGY WITH ANESTHESIA;  Surgeon: Oneal Grout, MD;  Location: [facility] OR;  Service: Radiology;  Laterality: N/A;      Current Outpatient Prescriptions  Medication Sig Dispense Refill  . carvedilol (COREG) 6.25 MG tablet Take 1 tablet (6.25 mg total) by mouth 2 (two) times daily with a meal.  60 tablet  3  . Cholecalciferol (VITAMIN D-3) 5000 UNITS TABS Take 1 tablet by mouth daily.       Marland Kitchen doxazosin (CARDURA) 4 MG tablet Take 1 tablet (4 mg total) by mouth at bedtime.  90 tablet  3  . famotidine (PEPCID) 10 MG tablet Take 10 mg by mouth every morning. For acid      . febuxostat (ULORIC) 40 MG tablet Take 40 mg by mouth every other day. For gout      . Ferrous Gluconate (IRON) 240 (27 FE) MG TABS Take 1 tablet by mouth daily.      . fish oil-omega-3 fatty acids 1000 MG capsule Take 1 g by mouth daily.       . hydrALAZINE (APRESOLINE) 10 MG tablet Take 1.5 tablets (15 mg total) by mouth 3 (three) times daily.  135 tablet  0  . hydroxychloroquine (PLAQUENIL) 200 MG tablet Take 200 mg by mouth 2 (two) times daily.       . isosorbide mononitrate (IMDUR) 30 MG 24 hr tablet Take 1 tablet (30 mg total) by mouth daily.  30 tablet  0  . levalbuterol (XOPENEX) 0.63 MG/3ML nebulizer solution Take 3 mLs (0.63 mg total) by nebulization every 4 (four) hours as needed for wheezing.  3 mL  12  . metolazone (ZAROXOLYN) 2.5 MG tablet 1 tablet today and then only as directed  30 tablet  0  . potassium chloride SA (K-DUR,KLOR-CON) 20 MEQ tablet Take 1 tablet (20 mEq total) by mouth daily.  90 tablet  3  . rosuvastatin (CRESTOR) 10 MG tablet Take 1 tablet (10 mg total) by mouth every evening.  303 tablet  3  . torsemide (DEMADEX) 20 MG tablet Take 2 tablets (40 mg total) by mouth 2 (two) times daily.      . VENTOLIN HFA 108 (90 BASE) MCG/ACT inhaler as needed.      . warfarin (COUMADIN) 5 MG tablet Take 2.5 mg  by mouth daily.        No current facility-administered medications for this visit.    Allergies:   No Known Allergies  Social History:  The patient  reports that he has never smoked. He has never used smokeless tobacco.  He reports that he does not drink alcohol or use illicit drugs.   Family History:  The patient's family history includes Heart attack in his father; Heart failure in his mother; Hypertension in his father.   ROS:  Please see the history of present illness.   All other systems reviewed and negative.   PHYSICAL EXAM: VS:  BP 114/68  Pulse 84  Ht 5\' 6"  (1.676 m)  Wt 222 lb (100.699 kg)  BMI 35.85 kg/m2 Well nourished, well developed, in no acute distress HEENT: normal aside from very mild periorbital edema Neck:  no JVD  Cardiac:  normal S1, S2; irreg irreg rhythm; 2/6 systolic murmur RUSB Lungs:  Clear to auscultation bilaterally, no wheezing, rhonchi or rales   Abd:  Soft, non-tender, no hepatomegaly Ext:  very trace bilateral LE edema  Skin: warm and dry Neuro:  CNs 2-12 intact, no focal abnormalities noted  EKG:  Atrial fibrillation, HR 84, normal axis, nonspecific ST-T wave changes   No change from prior tracing  ASSESSMENT AND PLAN:  1. Chronic Systolic CHF:  Volume stable. He does a very good job of monitoring his weights. He clearly understands when to take a dose of metolazone.  Continue current therapy. Check a follow up basic metabolic panel today. 2. Aortic Stenosis: Moderate to severe by echocardiogram in 12/2012. This is not felt to be contributing to his CHF. He will likely need a follow up echocardiogram at some point in the next several months. 3. Chronic Kidney Disease Stage IV: Obtain followup basic metabolic panel today. Continue follow up with Dr. Lowell GuitarPowell. 4. Atrial Fibrillation:  Rate controlled. He is tolerating Coumadin. 5. CAD:  No angina.  He is not on ASA as he is on Coumadin.  Continue statin.  6. Hypertension:  Controlled. 7. Hyperlipidemia: Continue statin. 8. Disposition:  followup with Dr. Elease HashimotoNahser in 6-8 weeks.   Signed, Tereso NewcomerScott Weaver, PA-C  06/16/2013 3:48 PM

## 2013-06-17 ENCOUNTER — Other Ambulatory Visit: Payer: Self-pay

## 2013-06-17 LAB — BASIC METABOLIC PANEL
BUN: 63 mg/dL — AB (ref 6–23)
CO2: 32 mEq/L (ref 19–32)
Calcium: 9.1 mg/dL (ref 8.4–10.5)
Chloride: 104 mEq/L (ref 96–112)
Creatinine, Ser: 3.4 mg/dL — ABNORMAL HIGH (ref 0.4–1.5)
GFR: 18.49 mL/min — ABNORMAL LOW (ref >60.00)
Glucose, Bld: 96 mg/dL (ref 70–99)
POTASSIUM: 4.4 meq/L (ref 3.5–5.1)
SODIUM: 144 meq/L (ref 135–145)

## 2013-06-17 MED ORDER — ISOSORBIDE MONONITRATE ER 30 MG PO TB24: 30.0000 mg | ORAL_TABLET | Freq: Every day | ORAL | Status: DC

## 2013-06-17 MED ORDER — HYDRALAZINE HCL 10 MG PO TABS: 15.0000 mg | ORAL_TABLET | Freq: Three times a day (TID) | ORAL | Status: DC

## 2013-06-18 ENCOUNTER — Telehealth: Payer: Self-pay | Admitting: *Deleted

## 2013-06-18 NOTE — Telephone Encounter (Signed)
busy signal on both home and mobile. I will try again later

## 2013-06-18 NOTE — Telephone Encounter (Signed)
both pt and wife have been notified about lab results with verbal understanding. I will fax results to Dr. Lowell GuitarPowell (Nephrology)

## 2013-07-02 ENCOUNTER — Ambulatory Visit (INDEPENDENT_AMBULATORY_CARE_PROVIDER_SITE_OTHER): Payer: Medicare Other | Admitting: Pulmonary Disease

## 2013-07-02 ENCOUNTER — Encounter: Payer: Self-pay | Admitting: Pulmonary Disease

## 2013-07-02 VITALS — BP 112/68 | HR 83 | Temp 97.5°F | Ht 66.0 in | Wt 221.0 lb

## 2013-07-02 DIAGNOSIS — G4733 Obstructive sleep apnea (adult) (pediatric): Secondary | ICD-10-CM

## 2013-07-02 NOTE — Assessment & Plan Note (Signed)
The patient is doing extremely well from a sleep apnea standpoint on CPAP. His wife thinks he sleeps well with the device, and the patient feels rested in the mornings upon arising. He also thinks his daytime alertness is adequate. I've asked him to keep up with his mask cushion changes and supplies, and to work aggressively on further weight loss.

## 2013-07-02 NOTE — Progress Notes (Signed)
   Subjective:    Patient ID: Alex Orozco, male    DOB: 01/21/1930, 80 y.o.   MRN: [id number]  HPI The patient comes in today for followup of his obstructive sleep apnea. He is wearing CPAP compliantly, and is having no issues with his mask fit or pressure tolerance. He feels he is sleeping well, and his wife has not heard breakthrough events. He is satisfied with his daytime alertness. He is being aggressively diuresed by cardiology, and has lost 20 pounds since the last visit. He feels that his breathing is greatly improved since that time.   Review of Systems  Constitutional: Negative for fever and unexpected weight change.  HENT: Negative for congestion, dental problem, ear pain, nosebleeds, postnasal drip, rhinorrhea, sinus pressure, sneezing, sore throat and trouble swallowing.   Eyes: Negative for redness and itching.  Respiratory: Negative for cough, chest tightness, shortness of breath and wheezing.   Cardiovascular: Negative for palpitations and leg swelling.  Gastrointestinal: Negative for nausea and vomiting.  Genitourinary: Negative for dysuria.  Musculoskeletal: Negative for joint swelling.  Skin: Negative for rash.  Neurological: Negative for headaches.  Hematological: Does not bruise/bleed easily.  Psychiatric/Behavioral: Negative for dysphoric mood. The patient is not nervous/anxious.        Objective:   Physical Exam Overweight male in no acute distress Nose without purulence or discharge noted No skin breakdown or pressure necrosis from the CPAP mask Neck without lymphadenopathy or thyromegaly Lower extremities with minimal edema, no cyanosis Alert and oriented, moves all 4 extremities.       Assessment & Plan:

## 2013-07-02 NOTE — Patient Instructions (Signed)
Continue with cpap, and keep up with mask changes and supplies. Work on weight loss followup with me again in one year.  

## 2013-07-07 ENCOUNTER — Ambulatory Visit (INDEPENDENT_AMBULATORY_CARE_PROVIDER_SITE_OTHER): Payer: Medicare Other | Admitting: Pharmacist

## 2013-07-07 DIAGNOSIS — I4891 Unspecified atrial fibrillation: Secondary | ICD-10-CM

## 2013-07-07 DIAGNOSIS — Z5181 Encounter for therapeutic drug level monitoring: Secondary | ICD-10-CM

## 2013-07-07 LAB — POCT INR: INR: 2.2

## 2013-07-21 ENCOUNTER — Other Ambulatory Visit (INDEPENDENT_AMBULATORY_CARE_PROVIDER_SITE_OTHER): Payer: Medicare Other

## 2013-07-21 DIAGNOSIS — E785 Hyperlipidemia, unspecified: Secondary | ICD-10-CM

## 2013-07-21 LAB — BASIC METABOLIC PANEL
BUN: 59 mg/dL — ABNORMAL HIGH (ref 6–23)
CALCIUM: 9.2 mg/dL (ref 8.4–10.5)
CO2: 29 meq/L (ref 19–32)
CREATININE: 3.3 mg/dL — AB (ref 0.4–1.5)
Chloride: 104 mEq/L (ref 96–112)
GFR: 19 mL/min — ABNORMAL LOW (ref >60.00)
GLUCOSE: 83 mg/dL (ref 70–99)
Potassium: 4.1 mEq/L (ref 3.5–5.1)
SODIUM: 143 meq/L (ref 135–145)

## 2013-07-21 LAB — HEPATIC FUNCTION PANEL
ALT: 17 U/L (ref 0–53)
AST: 26 U/L (ref 0–37)
Albumin: 3.7 g/dL (ref 3.5–5.2)
Alkaline Phosphatase: 55 U/L (ref 39–117)
BILIRUBIN TOTAL: 0.7 mg/dL (ref 0.3–1.2)
Bilirubin, Direct: 0.1 mg/dL (ref 0.0–0.3)
Total Protein: 6.7 g/dL (ref 6.0–8.3)

## 2013-07-21 LAB — LIPID PANEL
CHOL/HDL RATIO: 2
Cholesterol: 80 mg/dL (ref 0–200)
HDL: 39.6 mg/dL (ref >39.00)
LDL Cholesterol: 27 mg/dL (ref 0–99)
TRIGLYCERIDES: 65 mg/dL (ref 0.0–149.0)
VLDL: 13 mg/dL (ref 0.0–40.0)

## 2013-07-28 ENCOUNTER — Ambulatory Visit (INDEPENDENT_AMBULATORY_CARE_PROVIDER_SITE_OTHER): Payer: Medicare Other

## 2013-07-28 ENCOUNTER — Encounter: Payer: Self-pay | Admitting: Cardiovascular Disease

## 2013-07-28 ENCOUNTER — Ambulatory Visit (INDEPENDENT_AMBULATORY_CARE_PROVIDER_SITE_OTHER): Payer: Medicare Other | Admitting: Cardiovascular Disease

## 2013-07-28 VITALS — BP 132/78 | HR 80 | Ht 66.0 in | Wt 227.0 lb

## 2013-07-28 DIAGNOSIS — I4891 Unspecified atrial fibrillation: Secondary | ICD-10-CM

## 2013-07-28 DIAGNOSIS — I509 Heart failure, unspecified: Secondary | ICD-10-CM

## 2013-07-28 DIAGNOSIS — Z5181 Encounter for therapeutic drug level monitoring: Secondary | ICD-10-CM

## 2013-07-28 DIAGNOSIS — I5022 Chronic systolic (congestive) heart failure: Secondary | ICD-10-CM

## 2013-07-28 DIAGNOSIS — Z951 Presence of aortocoronary bypass graft: Secondary | ICD-10-CM

## 2013-07-28 LAB — POCT INR: INR: 2.4

## 2013-07-28 NOTE — Progress Notes (Signed)
Alex Orozco Date of Birth  [birth year]       [facility]    [place] [address], [address]  [address], [address] [place], [place]  [address]   [place], [place]  [address] [phone]     [phone]   Fax  [phone]    Fax [phone]  Problem List: 1. Coronary artery disease- s/p CABG, s/p stenting of SVG to D3. He has stenosis of native LCx. 2. Restrictive lung disease 3. Chronic renal insufficiency 4. Congestive heart failure-ejection fraction of 55-60%  by echo.  There is evidence of diastolic dysfunction. 5. Hypertension 6. Dyslipidemia 7. Moderate aortic stenosis A. Atrial fibrillation 9. History of strep bacteremia 10. History of anemia   History of Present Illness: He was  admitted in April, 2013 with an episode of pneumonia and systolic congestive heart failure.  He was discharge with Levofloxacin, steroid taper, and nebs.  He saw Tereso Newcomer in his Lasix dose was increased to 60 mg twice a day. He had acute worsening of his renal function with a creatinine up to 2.0. His Lasix was  Held for several doses. Currently he is on a Lasix 60 mg in the AM and 40 mg in the evening.  He seems to be doing well with this dose.  He is feeling OK now.  He complains of generalized fatigue.   He has had 5 injections of"rooster comb"  into his left knee  No angina, breathing is ok.  Wearing his CPAP at night. Using the "Lounge Doctor" regularly.  May 19, 20014:  Alex Orozco is doing OK.  He still eats salt.   BP is OK.   Oct. 14, 2014:  Alex Orozco is doing OK.  He was recently in the hospital for CHF.  He has done well - has lost 18 lbs since then.    His leg edema has resolved.  His BMP shows gradual worsening renal function.  July 28, 2013:  Alex Orozco has had some issues with congestive heart failure. He has seen Tereso Newcomer several times. He also has seen Dr. Lowell Guitar for progressive renal insufficiency. He takes torsemide 40 mg twice a day. If he gains more  than 3 pounds in one day or 5 pounds in weight he'll take 2.5 mg of Metalazone.    He seems to be holding his own.     Current Outpatient Prescriptions on File Prior to Visit  Medication Sig Dispense Refill  . carvedilol (COREG) 6.25 MG tablet Take 1 tablet (6.25 mg total) by mouth 2 (two) times daily with a meal.  60 tablet  3  . Cholecalciferol (VITAMIN D-3) 5000 UNITS TABS Take 1 tablet by mouth daily.       Marland Kitchen doxazosin (CARDURA) 4 MG tablet Take 1 tablet (4 mg total) by mouth at bedtime.  90 tablet  3  . famotidine (PEPCID) 10 MG tablet Take 10 mg by mouth every morning. For acid      . febuxostat (ULORIC) 40 MG tablet Take 40 mg by mouth every other day. For gout      . Ferrous Gluconate (IRON) 240 (27 FE) MG TABS Take 1 tablet by mouth daily.      . fish oil-omega-3 fatty acids 1000 MG capsule Take 1 g by mouth daily.       . hydrALAZINE (APRESOLINE) 10 MG tablet Take 1.5 tablets (15 mg total) by mouth 3 (three) times daily.  135 tablet  2  . hydroxychloroquine (  PLAQUENIL) 200 MG tablet Take 200 mg by mouth 2 (two) times daily.       . isosorbide mononitrate (IMDUR) 30 MG 24 hr tablet Take 1 tablet (30 mg total) by mouth daily.  30 tablet  2  . levalbuterol (XOPENEX) 0.63 MG/3ML nebulizer solution Take 3 mLs (0.63 mg total) by nebulization every 4 (four) hours as needed for wheezing.  3 mL  12  . metolazone (ZAROXOLYN) 2.5 MG tablet 1 tablet today and then only as directed  30 tablet  0  . rosuvastatin (CRESTOR) 10 MG tablet Take 1 tablet (10 mg total) by mouth every evening.  303 tablet  3  . torsemide (DEMADEX) 20 MG tablet Take 2 tablets (40 mg total) by mouth 2 (two) times daily.      . VENTOLIN HFA 108 (90 BASE) MCG/ACT inhaler as needed.      . warfarin (COUMADIN) 5 MG tablet Take 2.5 mg by mouth daily.        No current facility-administered medications on file prior to visit.    No Known Allergies  Past Medical History  Diagnosis Date  . Coronary artery disease     a.   s/p CABG 1990;  b. NSTEMI 9/10:  LHC with S-RCA ok, S-D1 occluded (chronic), S-D3 occluded, L-LAD ok;  PCI:  Endeavour DES to S-D3;  c. 10/2011 Lexiscan MV EF 37%, medium sized fixed defect over basilar to mid lateral wall.  No ishcemia.   . Lung disease, restrictive   . History of PFTs 01/20/2009  . Pleural effusion   . Hypertension   . Dyslipidemia   . Aortic stenosis     a. echo 4/13: mild AS with mean 19 mmHg. b. echo 12/2012: Mild LVH, EF 25-30%, diffuse HK, moderate to severe aortic stenosis (mean gradient 16 mm of mercury), moderate MR, moderate LAE, mild RVE, mild reduced RVSF, moderate RAE, PASP 40  . Anemia   . Difficult intubation   . Arthritis   . Epistaxis     a. 11/2012: adm with severe epistaxis requiring mechanical ventilation for airway protection c/b blood loss anemia  . Mitral regurgitation     a. Mod by echo 12/2012.  . CKD (chronic kidney disease), stage IV     Dr. Lowell Guitar  . Permanent atrial fibrillation     a. Chronic coumadin  . Sleep apnea   . Chronic systolic CHF (congestive heart failure)     a. Previous EF 40-50%;  b. Echo 08/2011: mild LVH, EF 55-65, grade 3 diast dysfxn. c. EF 25-30% by echo 12/2012.    Past Surgical History  Procedure Laterality Date  . Coronary artery bypass graft  1999  . Coronary angioplasty with stent placement  2010    DES to the SVG-D3 (100%->0%). LAD 100%, CFX 90%, RCA 100%; LIMA-LAD OK, SVG-D1 100%, SVG-RCA OK  . Back surgery    . Hernia repair    . Dccv    . Cataract extraction      bilateral  . Radiology with anesthesia N/A 12/13/2012    Procedure: RADIOLOGY WITH ANESTHESIA;  Surgeon: Oneal Grout, MD;  Location: [facility] OR;  Service: Radiology;  Laterality: N/A;    History  Smoking status  . Never Smoker   Smokeless tobacco  . Never Used    History  Alcohol Use No    Family History  Problem Relation Age of Onset  . Heart failure Mother   . Heart attack Father   . Hypertension Father  Reviw of Systems:   Reviewed in the HPI.  All other systems are negative.  Physical Exam: Blood pressure 132/78, pulse 80, height 5\' 6"  (1.676 m), weight 227 lb (102.967 kg). General: Well developed, well nourished, in no acute distress.  Head: Normocephalic, atraumatic, sclera non-icteric, mucus membranes are moist,   Neck: Supple. Carotids are 2 + without bruits. No JVD  Lungs: Clear bilaterally to auscultation.  Heart: regular rate.  normal  S1 S2. Soft systolic murmur  Abdomen: Soft, non-tender, non-distended with normal bowel sounds. No hepatomegaly. No rebound/guarding. No masses.  Msk:  Strength and tone are normal  Extremities: No clubbing or cyanosis. No significant  leg edema.  Unchanged from previous exams Skin:  Slightly decreased skin turgur.  Neuro: Alert and oriented X 3. Moves all extremities spontaneously.  Psych:  Responds to questions appropriately with a normal affect.  ECG: Oct 07, 2012:  NSR at 4862.  Poor R wave progression.  Assessment / Plan:

## 2013-07-28 NOTE — Assessment & Plan Note (Signed)
Stable, no angina  

## 2013-07-28 NOTE — Patient Instructions (Signed)
Your physician recommends that you schedule a follow-up appointment in: 3 months. Your physician recommends that you continue on your current medications as directed. Please refer to the Current Medication list given to you today. 

## 2013-07-28 NOTE — Assessment & Plan Note (Addendum)
Alex Orozco  seems to be doing fairly well. He has severe systolic congestive heart failure.  He takes demadex BID and takes metalazone as needed based on his weight.   Over all , I think he is doing quite well.    Continue current meds.    I will see him in 3 months

## 2013-08-13 ENCOUNTER — Other Ambulatory Visit: Payer: Self-pay

## 2013-08-13 MED ORDER — ROSUVASTATIN CALCIUM 10 MG PO TABS: 10.0000 mg | ORAL_TABLET | Freq: Every evening | ORAL | Status: DC

## 2013-08-25 ENCOUNTER — Ambulatory Visit (INDEPENDENT_AMBULATORY_CARE_PROVIDER_SITE_OTHER): Payer: Medicare Other

## 2013-08-25 DIAGNOSIS — I4891 Unspecified atrial fibrillation: Secondary | ICD-10-CM

## 2013-08-25 DIAGNOSIS — Z5181 Encounter for therapeutic drug level monitoring: Secondary | ICD-10-CM

## 2013-08-25 LAB — POCT INR: INR: 2.9

## 2013-09-10 ENCOUNTER — Other Ambulatory Visit: Payer: Self-pay

## 2013-09-10 MED ORDER — CARVEDILOL 6.25 MG PO TABS: 6.2500 mg | ORAL_TABLET | Freq: Two times a day (BID) | ORAL | Status: DC

## 2013-09-16 ENCOUNTER — Other Ambulatory Visit: Payer: Self-pay | Admitting: *Deleted

## 2013-09-16 MED ORDER — ISOSORBIDE MONONITRATE ER 30 MG PO TB24: 30.0000 mg | ORAL_TABLET | Freq: Every day | ORAL | Status: DC

## 2013-09-24 ENCOUNTER — Other Ambulatory Visit: Payer: Self-pay | Admitting: *Deleted

## 2013-09-24 MED ORDER — HYDRALAZINE HCL 10 MG PO TABS: 15.0000 mg | ORAL_TABLET | Freq: Three times a day (TID) | ORAL | Status: DC

## 2013-09-25 ENCOUNTER — Other Ambulatory Visit: Payer: Self-pay

## 2013-09-25 MED ORDER — HYDRALAZINE HCL 10 MG PO TABS: 15.0000 mg | ORAL_TABLET | Freq: Three times a day (TID) | ORAL | Status: DC

## 2013-10-06 ENCOUNTER — Ambulatory Visit (INDEPENDENT_AMBULATORY_CARE_PROVIDER_SITE_OTHER): Payer: Medicare Other | Admitting: *Deleted

## 2013-10-06 DIAGNOSIS — I4891 Unspecified atrial fibrillation: Secondary | ICD-10-CM

## 2013-10-06 DIAGNOSIS — Z5181 Encounter for therapeutic drug level monitoring: Secondary | ICD-10-CM

## 2013-10-06 LAB — POCT INR: INR: 1.8

## 2013-10-29 ENCOUNTER — Ambulatory Visit (INDEPENDENT_AMBULATORY_CARE_PROVIDER_SITE_OTHER): Payer: Medicare Other | Admitting: Cardiovascular Disease

## 2013-10-29 ENCOUNTER — Encounter: Payer: Self-pay | Admitting: Cardiovascular Disease

## 2013-10-29 ENCOUNTER — Ambulatory Visit (INDEPENDENT_AMBULATORY_CARE_PROVIDER_SITE_OTHER): Payer: Medicare Other | Admitting: *Deleted

## 2013-10-29 VITALS — BP 108/70 | HR 83 | Ht 66.0 in | Wt 217.0 lb

## 2013-10-29 DIAGNOSIS — Z951 Presence of aortocoronary bypass graft: Secondary | ICD-10-CM

## 2013-10-29 DIAGNOSIS — I10 Essential (primary) hypertension: Secondary | ICD-10-CM

## 2013-10-29 DIAGNOSIS — Z8679 Personal history of other diseases of the circulatory system: Secondary | ICD-10-CM

## 2013-10-29 DIAGNOSIS — Z5181 Encounter for therapeutic drug level monitoring: Secondary | ICD-10-CM

## 2013-10-29 DIAGNOSIS — I5023 Acute on chronic systolic (congestive) heart failure: Secondary | ICD-10-CM

## 2013-10-29 DIAGNOSIS — I5022 Chronic systolic (congestive) heart failure: Secondary | ICD-10-CM

## 2013-10-29 DIAGNOSIS — I509 Heart failure, unspecified: Secondary | ICD-10-CM

## 2013-10-29 DIAGNOSIS — I4891 Unspecified atrial fibrillation: Secondary | ICD-10-CM

## 2013-10-29 LAB — POCT INR: INR: 2.1

## 2013-10-29 MED ORDER — CARVEDILOL 12.5 MG PO TABS: 12.5000 mg | ORAL_TABLET | Freq: Two times a day (BID) | ORAL | Status: DC

## 2013-10-29 MED ORDER — DOXAZOSIN MESYLATE 4 MG PO TABS: ORAL_TABLET | ORAL | Status: DC

## 2013-10-29 NOTE — Patient Instructions (Addendum)
Your physician has recommended you make the following change in your medication:  1. DECREASE CARDURA 4 MG 1/2 TAB DAILY 2.INCREASE CARVEDILOL 12.5 MG TWICE DAILY     Your physician recommends that you schedule a follow-up appointment in: 3 months with Dr. Elease Hashimoto

## 2013-10-29 NOTE — Assessment & Plan Note (Signed)
His blood pressure is well-controlled 

## 2013-10-29 NOTE — Assessment & Plan Note (Addendum)
He remains in A-fib. Rate is a bit fast today. Will increase coreg today to 21.5 bid Will decrease cardura to 2 mg a day to allow his BP to be high enough.

## 2013-10-29 NOTE — Progress Notes (Signed)
Alex Orozco Date of Birth  08-Aug-1929       [facility]    [place] [address]  [address] [place], [place]  [address]   [place], [place]  [address] [phone]     [phone]   Fax  [phone]    Fax [phone]  Problem List: 1. Coronary artery disease- s/p CABG, s/p stenting of SVG to D3. He has stenosis of native LCx. 2. Restrictive lung disease 3. Chronic renal insufficiency 4. Congestive heart failure-ejection fraction of 55-60%  by echo.  There is evidence of diastolic dysfunction. 5. Hypertension 6. Dyslipidemia 7. Moderate aortic stenosis A. Atrial fibrillation 9. History of strep bacteremia 10. History of anemia   History of Present Illness: He was  admitted in April, 2013 with an episode of pneumonia and systolic congestive heart failure.  He was discharge with Levofloxacin, steroid taper, and nebs.  He saw Tereso Orozco in his Lasix dose was increased to 60 mg twice a day. He had acute worsening of his renal function with a creatinine up to 2.0. His Lasix was  Held for several doses. Currently he is on a Lasix 60 mg in the AM and 40 mg in the evening.  He seems to be doing well with this dose.  He is feeling OK now.  He complains of generalized fatigue.   He has had 5 injections of"rooster comb"  into his left knee  No angina, breathing is ok.  Wearing his CPAP at night. Using the "Lounge Doctor" regularly.  May 19, 20014:  Alex Orozco is doing OK.  He still eats salt.   BP is OK.   Oct. 14, 2014:  Alex Orozco is doing OK.  He was recently in the hospital for CHF.  He has done well - has lost 18 lbs since then.    His leg edema has resolved.  His BMP shows gradual worsening renal function.  July 28, 2013:  Alex Orozco has had some issues with congestive heart failure. He has seen Tereso Orozco several times. He also has seen Dr. Lowell Orozco for progressive renal insufficiency. He takes torsemide 40 mg twice a day. If he gains more  than 3 pounds in one day or 5 pounds in weight he'll take 2.5 mg of Metalazone.    He seems to be holding his own.    October 29, 2013:  Alex Orozco is doing ok.   Breathing is ok. Taking metolazone PRN weight gain.     Current Outpatient Prescriptions on File Prior to Visit  Medication Sig Dispense Refill  . carvedilol (COREG) 6.25 MG tablet Take 1 tablet (6.25 mg total) by mouth 2 (two) times daily with a meal.  60 tablet  6  . Cholecalciferol (VITAMIN D-3) 5000 UNITS TABS Take 1 tablet by mouth daily.       Marland Kitchen doxazosin (CARDURA) 4 MG tablet Take 1 tablet (4 mg total) by mouth at bedtime.  90 tablet  3  . famotidine (PEPCID) 10 MG tablet Take 10 mg by mouth every morning. For acid      . febuxostat (ULORIC) 40 MG tablet Take 40 mg by mouth every other day. For gout      . Ferrous Gluconate (IRON) 240 (27 FE) MG TABS Take 1 tablet by mouth daily.      . fish oil-omega-3 fatty acids 1000 MG capsule Take 1 g by mouth daily.       . hydrALAZINE (APRESOLINE) 10 MG  tablet Take 1.5 tablets (15 mg total) by mouth 3 (three) times daily.  135 tablet  1  . hydroxychloroquine (PLAQUENIL) 200 MG tablet Take 200 mg by mouth 2 (two) times daily.       . isosorbide mononitrate (IMDUR) 30 MG 24 hr tablet Take 1 tablet (30 mg total) by mouth daily.  30 tablet  2  . levalbuterol (XOPENEX) 0.63 MG/3ML nebulizer solution Take 3 mLs (0.63 mg total) by nebulization every 4 (four) hours as needed for wheezing.  3 mL  12  . metolazone (ZAROXOLYN) 2.5 MG tablet 1 tablet today and then only as directed  30 tablet  0  . potassium chloride SA (K-DUR,KLOR-CON) 20 MEQ tablet Take 20 mEq by mouth 2 (two) times daily.      . rosuvastatin (CRESTOR) 10 MG tablet Take 1 tablet (10 mg total) by mouth every evening.  30 tablet  3  . torsemide (DEMADEX) 20 MG tablet Take 2 tablets (40 mg total) by mouth 2 (two) times daily.      . VENTOLIN HFA 108 (90 BASE) MCG/ACT inhaler as needed.      . warfarin (COUMADIN) 5 MG tablet Take 2.5 mg  by mouth daily.        No current facility-administered medications on file prior to visit.    No Known Allergies  Past Medical History  Diagnosis Date  . Coronary artery disease     a.  s/p CABG 1990;  b. NSTEMI 9/10:  LHC with S-RCA ok, S-D1 occluded (chronic), S-D3 occluded, L-LAD ok;  PCI:  Endeavour DES to S-D3;  c. 10/2011 Lexiscan MV EF 37%, medium sized fixed defect over basilar to mid lateral wall.  No ishcemia.   . Lung disease, restrictive   . History of PFTs 01/20/2009  . Pleural effusion   . Hypertension   . Dyslipidemia   . Aortic stenosis     a. echo 4/13: mild AS with mean 19 mmHg. b. echo 12/2012: Mild LVH, EF 25-30%, diffuse HK, moderate to severe aortic stenosis (mean gradient 16 mm of mercury), moderate MR, moderate LAE, mild RVE, mild reduced RVSF, moderate RAE, PASP 40  . Anemia   . Difficult intubation   . Arthritis   . Epistaxis     a. 11/2012: adm with severe epistaxis requiring mechanical ventilation for airway protection c/b blood loss anemia  . Mitral regurgitation     a. Mod by echo 12/2012.  . CKD (chronic kidney disease), stage IV     Dr. Lowell Orozco  . Permanent atrial fibrillation     a. Chronic coumadin  . Sleep apnea   . Chronic systolic CHF (congestive heart failure)     a. Previous EF 40-50%;  b. Echo 08/2011: mild LVH, EF 55-65, grade 3 diast dysfxn. c. EF 25-30% by echo 12/2012.    Past Surgical History  Procedure Laterality Date  . Coronary artery bypass graft  1999  . Coronary angioplasty with stent placement  2010    DES to the SVG-D3 (100%->0%). LAD 100%, CFX 90%, RCA 100%; LIMA-LAD OK, SVG-D1 100%, SVG-RCA OK  . Back surgery    . Hernia repair    . Dccv    . Cataract extraction      bilateral  . Radiology with anesthesia N/A 12/13/2012    Procedure: RADIOLOGY WITH ANESTHESIA;  Surgeon: Alex Grout, MD;  Location: [facility] OR;  Service: Radiology;  Laterality: N/A;    History  Smoking status  . Never Smoker  Smokeless tobacco  .  Never Used    History  Alcohol Use No    Family History  Problem Relation Age of Onset  . Heart failure Mother   . Heart attack Father   . Hypertension Father     Reviw of Systems:  Reviewed in the HPI.  All other systems are negative.  Physical Exam: Blood pressure 108/70, pulse 83, height 5\' 6"  (1.676 m), weight 217 lb (98.431 kg). General: Well developed, well nourished, in no acute distress.  Head: Normocephalic, atraumatic, sclera non-icteric, mucus membranes are moist,   Neck: Supple. Carotids are 2 + without bruits. No JVD  Lungs: Clear bilaterally to auscultation.  Heart: irreg irreg .  normal  S1 S2. Soft systolic murmur  Abdomen: Soft, non-tender, non-distended with normal bowel sounds. No hepatomegaly. No rebound/guarding. No masses.  Msk:  Strength and tone are normal  Extremities: No clubbing or cyanosis. No significant  leg edema.  Unchanged from previous exams Skin:  Slightly decreased skin turgur.  Neuro: Alert and oriented X 3. Moves all extremities spontaneously.  Psych:  Responds to questions appropriately with a normal affect.  ECG: .  Assessment / Plan:

## 2013-10-29 NOTE — Assessment & Plan Note (Signed)
No angina Continue current meds 

## 2013-10-29 NOTE — Assessment & Plan Note (Signed)
His heart her symptoms seemed to do well controlled. He's been watching his salt. He has minimal leg edema. Continue with the same medications.

## 2013-10-29 NOTE — Assessment & Plan Note (Addendum)
He seems to be holding his own.  increase coreg to 12. 5 bid.   Will need to decrease cardura to allow his bp to be high enough.

## 2013-11-26 ENCOUNTER — Ambulatory Visit (INDEPENDENT_AMBULATORY_CARE_PROVIDER_SITE_OTHER): Payer: Medicare Other | Admitting: *Deleted

## 2013-11-26 DIAGNOSIS — Z5181 Encounter for therapeutic drug level monitoring: Secondary | ICD-10-CM

## 2013-11-26 DIAGNOSIS — I4891 Unspecified atrial fibrillation: Secondary | ICD-10-CM

## 2013-11-26 LAB — POCT INR: INR: 2.6

## 2013-11-29 IMAGING — CR DG CHEST 2V
2 series · 2 of 2 positions shown · non-contrast
Comparison: 05/17/2009.

CLINICAL DATA: Shortness of breath.

CHEST - 2 VIEW

[w chest pa]
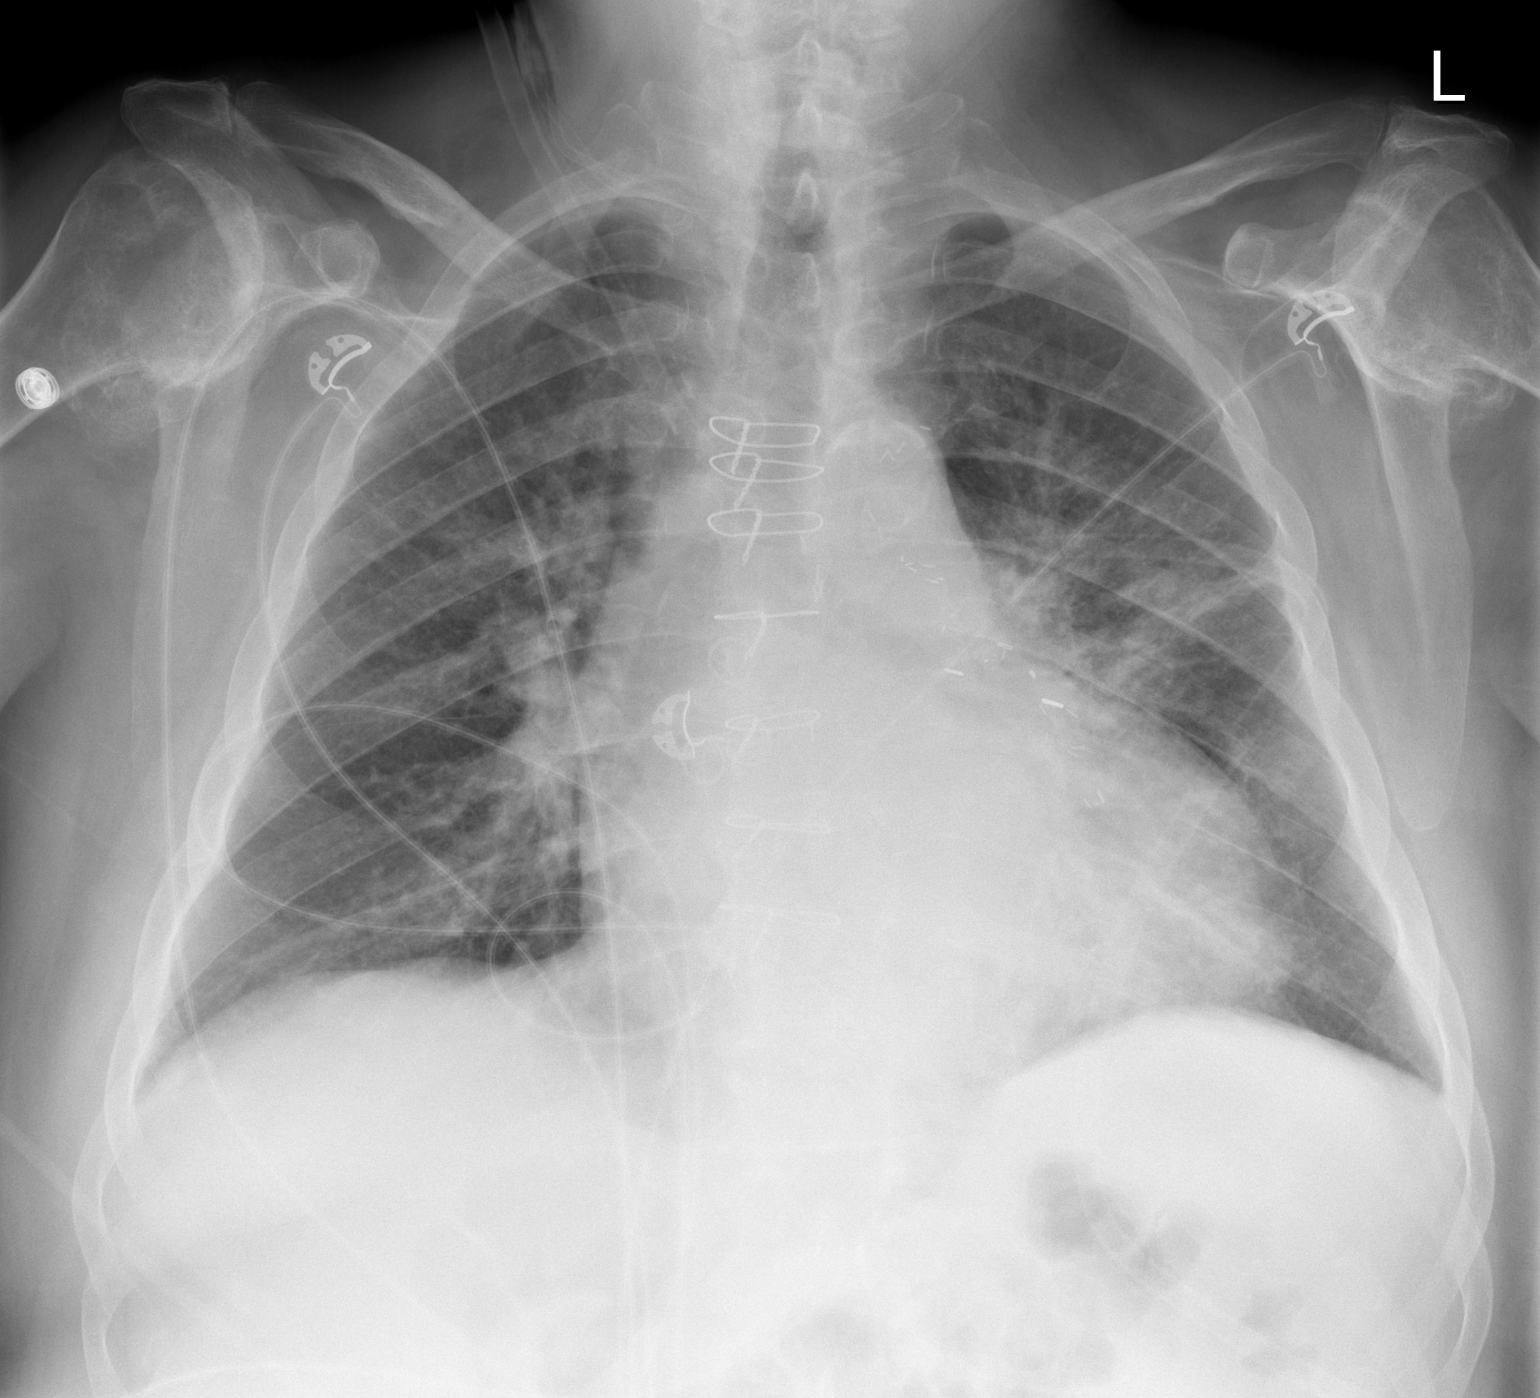

[w chest lat]
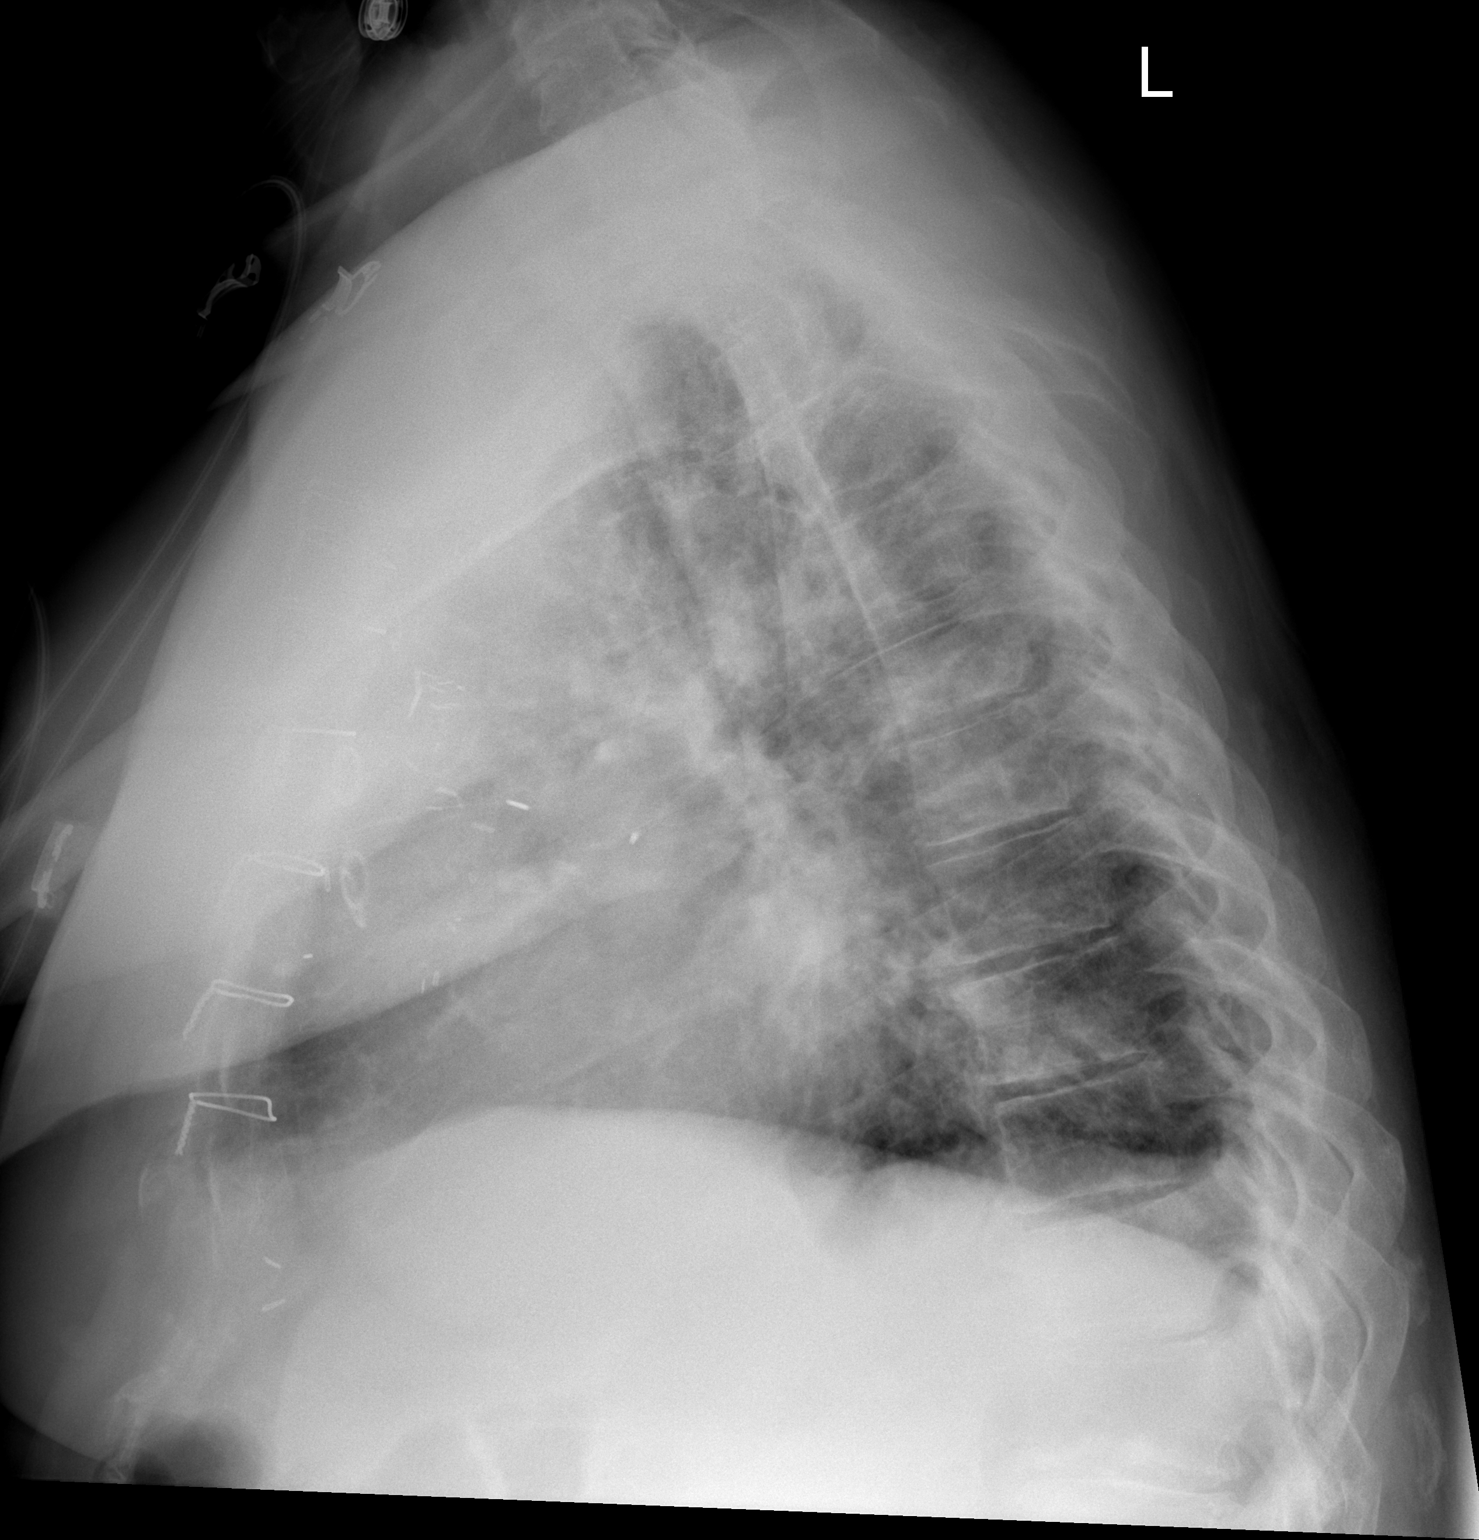

[2 of 2 positions shown; findings below may reference images not displayed]

FINDINGS: The heart is enlarged but stable.  Surgical changes from
bypass surgery are again noted.  There is central vascular
congestion and probable mild perihilar pulmonary edema.  No pleural
effusions or pneumothorax.  Stable advanced degenerative changes
involving both shoulders.
IMPRESSION: Mild CHF.

## 2013-12-03 ENCOUNTER — Other Ambulatory Visit: Payer: Self-pay | Admitting: *Deleted

## 2013-12-03 MED ORDER — WARFARIN SODIUM 5 MG PO TABS: 5.0000 mg | ORAL_TABLET | ORAL | Status: DC

## 2013-12-16 ENCOUNTER — Other Ambulatory Visit: Payer: Self-pay | Admitting: Cardiovascular Disease

## 2013-12-16 MED ORDER — ISOSORBIDE MONONITRATE ER 30 MG PO TB24: 30.0000 mg | ORAL_TABLET | Freq: Every day | ORAL | Status: DC

## 2013-12-22 ENCOUNTER — Other Ambulatory Visit: Payer: Self-pay | Admitting: Cardiovascular Disease

## 2013-12-24 ENCOUNTER — Ambulatory Visit (INDEPENDENT_AMBULATORY_CARE_PROVIDER_SITE_OTHER): Payer: Medicare Other | Admitting: *Deleted

## 2013-12-24 DIAGNOSIS — Z5181 Encounter for therapeutic drug level monitoring: Secondary | ICD-10-CM

## 2013-12-24 DIAGNOSIS — I4891 Unspecified atrial fibrillation: Secondary | ICD-10-CM

## 2013-12-24 LAB — POCT INR: INR: 2.1

## 2014-02-04 ENCOUNTER — Ambulatory Visit (INDEPENDENT_AMBULATORY_CARE_PROVIDER_SITE_OTHER): Payer: Medicare Other | Admitting: *Deleted

## 2014-02-04 ENCOUNTER — Ambulatory Visit (INDEPENDENT_AMBULATORY_CARE_PROVIDER_SITE_OTHER): Payer: Medicare Other | Admitting: Cardiovascular Disease

## 2014-02-04 ENCOUNTER — Encounter: Payer: Self-pay | Admitting: Cardiovascular Disease

## 2014-02-04 VITALS — BP 134/90 | HR 70 | Ht 66.0 in | Wt 211.8 lb

## 2014-02-04 DIAGNOSIS — I4891 Unspecified atrial fibrillation: Secondary | ICD-10-CM

## 2014-02-04 DIAGNOSIS — E785 Hyperlipidemia, unspecified: Secondary | ICD-10-CM

## 2014-02-04 DIAGNOSIS — Z7901 Long term (current) use of anticoagulants: Secondary | ICD-10-CM

## 2014-02-04 DIAGNOSIS — I5023 Acute on chronic systolic (congestive) heart failure: Secondary | ICD-10-CM

## 2014-02-04 DIAGNOSIS — I482 Chronic atrial fibrillation, unspecified: Secondary | ICD-10-CM

## 2014-02-04 DIAGNOSIS — Z5181 Encounter for therapeutic drug level monitoring: Secondary | ICD-10-CM

## 2014-02-04 LAB — POCT INR: INR: 2.8

## 2014-02-04 NOTE — Assessment & Plan Note (Signed)
Stay on same meds

## 2014-02-04 NOTE — Assessment & Plan Note (Signed)
Continue coumadin. Rate is well controlled.

## 2014-02-04 NOTE — Assessment & Plan Note (Signed)
To coumadin clinic today

## 2014-02-04 NOTE — Patient Instructions (Signed)

## 2014-02-04 NOTE — Progress Notes (Signed)
Alex Orozco Date of Birth  Aug 24, 1929       [facility]    [place] [address]  [address] [place], [place]  [address]   [place], [place]  [address] [phone]     [phone]   Fax  [phone]    Fax [phone]  Problem List: 1. Coronary artery disease- s/p CABG, s/p stenting of SVG to D3. He has stenosis of native LCx. 2. Restrictive lung disease 3. Chronic renal insufficiency 4. Congestive heart failure-ejection fraction of 55-60%  by echo.  There is evidence of diastolic dysfunction. 5. Hypertension 6. Dyslipidemia 7. Moderate aortic stenosis A. Atrial fibrillation 9. History of strep bacteremia 10. History of anemia   History of Present Illness: He was  admitted in April, 2013 with an episode of pneumonia and systolic congestive heart failure.  He was discharge with Levofloxacin, steroid taper, and nebs.  He saw Tereso Newcomer in his Lasix dose was increased to 60 mg twice a day. He had acute worsening of his renal function with a creatinine up to 2.0. His Lasix was  Held for several doses. Currently he is on a Lasix 60 mg in the AM and 40 mg in the evening.  He seems to be doing well with this dose.  He is feeling OK now.  He complains of generalized fatigue.   He has had 5 injections of"rooster comb"  into his left knee  No angina, breathing is ok.  Wearing his CPAP at night. Using the "Lounge Doctor" regularly.  May 19, [address]:  Alex Orozco is doing OK.  He still eats salt.   BP is OK.   Oct. 14, 2014:  Alex Orozco is doing OK.  He was recently in the hospital for CHF.  He has done well - has lost 18 lbs since then.    His leg edema has resolved.  His BMP shows gradual worsening renal function.  July 28, 2013:  Alex Orozco has had some issues with congestive heart failure. He has seen Tereso Newcomer several times. He also has seen Dr. Lowell Guitar for progressive renal insufficiency. He takes torsemide 40 mg twice a day. If he gains more  than 3 pounds in one day or 5 pounds in weight he'll take 2.5 mg of Metalazone.    He seems to be holding his own.    October 29, 2013:  Alex Orozco is doing ok.   Breathing is ok. Taking metolazone PRN weight gain.    Sept. 16, 2015:  Alex Orozco is doing ok.  Is getting over the shingles.   Breathing is ok.   No cp.  Mild Leg swelling   Current Outpatient Prescriptions on File Prior to Visit  Medication Sig Dispense Refill  . carvedilol (COREG) 12.5 MG tablet Take 1 tablet (12.5 mg total) by mouth 2 (two) times daily with a meal.  60 tablet  6  . Cholecalciferol (VITAMIN D-3) 5000 UNITS TABS Take 1 tablet by mouth daily.       . CRESTOR 10 MG tablet TAKE ONE TABLET BY MOUTH EVERY EVENING  30 tablet  6  . doxazosin (CARDURA) 4 MG tablet 1/2 TAB DAILY  90 tablet  3  . famotidine (PEPCID) 10 MG tablet Take 10 mg by mouth every morning. For acid      . Ferrous Gluconate (IRON) 240 (27 FE) MG TABS Take 1 tablet by mouth daily.      . fish oil-omega-3 fatty acids 1000 MG  capsule Take 1 g by mouth daily.       . hydrALAZINE (APRESOLINE) 10 MG tablet TAKE 1 AND 1/2 TABLETS BY MOUTH 3 TIMES DAILY  135 tablet  1  . hydroxychloroquine (PLAQUENIL) 200 MG tablet Take 200 mg by mouth 2 (two) times daily.       . isosorbide mononitrate (IMDUR) 30 MG 24 hr tablet Take 1 tablet (30 mg total) by mouth daily.  30 tablet  6  . levalbuterol (XOPENEX) 0.63 MG/3ML nebulizer solution Take 3 mLs (0.63 mg total) by nebulization every 4 (four) hours as needed for wheezing.  3 mL  12  . metolazone (ZAROXOLYN) 2.5 MG tablet 1 tablet today and then only as directed  30 tablet  0  . potassium chloride SA (K-DUR,KLOR-CON) 20 MEQ tablet Take 20 mEq by mouth 2 (two) times daily.      Marland Kitchen torsemide (DEMADEX) 20 MG tablet Take 2 tablets (40 mg total) by mouth 2 (two) times daily.      . VENTOLIN HFA 108 (90 BASE) MCG/ACT inhaler as needed.      . warfarin (COUMADIN) 5 MG tablet Take 1 tablet (5 mg total) by mouth as directed.  30 tablet   3   No current facility-administered medications on file prior to visit.    No Known Allergies  Past Medical History  Diagnosis Date  . Coronary artery disease     a.  s/p CABG 1990;  b. NSTEMI 9/10:  LHC with S-RCA ok, S-D1 occluded (chronic), S-D3 occluded, L-LAD ok;  PCI:  Endeavour DES to S-D3;  c. 10/2011 Lexiscan MV EF 37%, medium sized fixed defect over basilar to mid lateral wall.  No ishcemia.   . Lung disease, restrictive   . History of PFTs 01/20/2009  . Pleural effusion   . Hypertension   . Dyslipidemia   . Aortic stenosis     a. echo 4/13: mild AS with mean 19 mmHg. b. echo 12/2012: Mild LVH, EF 25-30%, diffuse HK, moderate to severe aortic stenosis (mean gradient 16 mm of mercury), moderate MR, moderate LAE, mild RVE, mild reduced RVSF, moderate RAE, PASP 40  . Anemia   . Difficult intubation   . Arthritis   . Epistaxis     a. 11/2012: adm with severe epistaxis requiring mechanical ventilation for airway protection c/b blood loss anemia  . Mitral regurgitation     a. Mod by echo 12/2012.  . CKD (chronic kidney disease), stage IV     Dr. Lowell Guitar  . Permanent atrial fibrillation     a. Chronic coumadin  . Sleep apnea   . Chronic systolic CHF (congestive heart failure)     a. Previous EF 40-50%;  b. Echo 08/2011: mild LVH, EF 55-65, grade 3 diast dysfxn. c. EF 25-30% by echo 12/2012.    Past Surgical History  Procedure Laterality Date  . Coronary artery bypass graft  1999  . Coronary angioplasty with stent placement  2010    DES to the SVG-D3 (100%->0%). LAD 100%, CFX 90%, RCA 100%; LIMA-LAD OK, SVG-D1 100%, SVG-RCA OK  . Back surgery    . Hernia repair    . Dccv    . Cataract extraction      bilateral  . Radiology with anesthesia N/A 12/13/2012    Procedure: RADIOLOGY WITH ANESTHESIA;  Surgeon: Oneal Grout, MD;  Location: [facility] OR;  Service: Radiology;  Laterality: N/A;    History  Smoking status  . Never Smoker   Smokeless  tobacco  . Never Used     History  Alcohol Use No    Family History  Problem Relation Age of Onset  . Heart failure Mother   . Heart attack Father   . Hypertension Father     Reviw of Systems:  Reviewed in the HPI.  All other systems are negative.  Physical Exam: Blood pressure 134/90, pulse 70, height  (1.676 m), weight 211 lb 12.8 oz (96.072 kg). General: Well developed, well nourished, in no acute distress.  Head: Normocephalic, atraumatic, sclera non-icteric, mucus membranes are moist,   Neck: Supple. Carotids are 2 + without bruits. No JVD  Lungs: Clear bilaterally to auscultation.  Heart: irreg irreg .  normal  S1 S2. Soft systolic murmur  Abdomen: Soft, non-tender, non-distended with normal bowel sounds. No hepatomegaly. No rebound/guarding. No masses.  Msk:  Strength and tone are normal  Extremities: No clubbing or cyanosis. No significant  leg edema.  Unchanged from previous exams Skin:  Slightly decreased skin turgur.  Neuro: Alert and oriented X 3. Moves all extremities spontaneously.  Psych:  Responds to questions appropriately with a normal affect.  ECG: .  Assessment / Plan:

## 2014-02-18 ENCOUNTER — Other Ambulatory Visit: Payer: Self-pay

## 2014-02-18 MED ORDER — POTASSIUM CHLORIDE CRYS ER 20 MEQ PO TBCR: 20.0000 meq | EXTENDED_RELEASE_TABLET | Freq: Two times a day (BID) | ORAL | Status: DC

## 2014-02-24 ENCOUNTER — Other Ambulatory Visit: Payer: Self-pay | Admitting: Cardiovascular Disease

## 2014-03-04 ENCOUNTER — Telehealth: Payer: Self-pay | Admitting: Cardiovascular Disease

## 2014-03-04 MED ORDER — DOXAZOSIN MESYLATE 4 MG PO TABS: 4.0000 mg | ORAL_TABLET | Freq: Every day | ORAL | Status: AC

## 2014-03-04 NOTE — Telephone Encounter (Signed)
Pt's wife called and she has questions about medications. Please call and advise.

## 2014-03-04 NOTE — Telephone Encounter (Signed)
Wife states patient was verbally changed from Doxazosin 4 mg 1/2 tab daily, to a whole tablet per Dr. Elease HashimotoNahser. Rx changed in computer and new rx sent to [facility].

## 2014-03-19 ENCOUNTER — Ambulatory Visit (INDEPENDENT_AMBULATORY_CARE_PROVIDER_SITE_OTHER): Payer: Medicare Other | Admitting: *Deleted

## 2014-03-19 DIAGNOSIS — I4891 Unspecified atrial fibrillation: Secondary | ICD-10-CM

## 2014-03-19 DIAGNOSIS — Z5181 Encounter for therapeutic drug level monitoring: Secondary | ICD-10-CM

## 2014-03-19 LAB — POCT INR: INR: 2.9

## 2014-03-26 ENCOUNTER — Telehealth: Payer: Self-pay | Admitting: Nurse Practitioner

## 2014-03-26 ENCOUNTER — Other Ambulatory Visit: Payer: Self-pay

## 2014-03-26 MED ORDER — CARVEDILOL 12.5 MG PO TABS: 12.5000 mg | ORAL_TABLET | Freq: Two times a day (BID) | ORAL | Status: DC

## 2014-03-26 NOTE — Telephone Encounter (Signed)
Spoke with [facility] regarding refill request received for Carvedilol 6.25 mg BID.  Patient is supposed to be on Carvedilol 12.5 mg BID since June 2015 and pharmacy states they only have record of 6.25 dose.  I advised that I will put new order for Carvedilol 12.5 mg BID and to please note on patient's chart so that patient does not received 2 different dosages of the medication.  The person I spoke with advised that she would make note in patient's record.

## 2014-03-30 ENCOUNTER — Telehealth: Payer: Self-pay | Admitting: Cardiovascular Disease

## 2014-03-30 DIAGNOSIS — N289 Disorder of kidney and ureter, unspecified: Secondary | ICD-10-CM

## 2014-03-30 DIAGNOSIS — E785 Hyperlipidemia, unspecified: Secondary | ICD-10-CM

## 2014-03-30 MED ORDER — TORSEMIDE 20 MG PO TABS: ORAL_TABLET | ORAL | Status: DC

## 2014-03-30 NOTE — Telephone Encounter (Signed)
Called stating that according to his last refill Rx on Torsemide he was to take 1 20 mg tablet BID but he states he is suppose to be on 2 (40mg ) BID.  States he has been gaining some wt is why he realized that he was taking meds according to what was on the bottle. Advised will send new Rx into [facility]. Advised to call us if  the correct dosage doesn't help with fluid level.  He verbalizes understanding.

## 2014-03-30 NOTE — Telephone Encounter (Signed)
New message      Question about dosage on torsemide.  His last presc from the pharmacy was 1 20mg  tablet bid instead of 2 tablets bid.  Please call to clarify dosage

## 2014-04-01 ENCOUNTER — Telehealth: Payer: Self-pay

## 2014-04-01 MED ORDER — CARVEDILOL 6.25 MG PO TABS: 6.2500 mg | ORAL_TABLET | Freq: Two times a day (BID) | ORAL | Status: AC

## 2014-04-01 NOTE — Telephone Encounter (Signed)
Spoke with patient's wife who states several months ago Dr. Elease HashimotoNahser advised patient to increase Carvedilol to 12.5 mg BID and to decrease Cardura.  She states that patient tried this for a few weeks and called back because he was feeling so flushed.  Wife states she called back and was advised to decrease Carvedilol back to 6.25 mg and to increase Cardura back to 4 mg.  Wife states patient never filled the Rx for Carvedilol 12.5, states he just took 2 of his 6.25 mg tabs.  I advised wife that the dosage recorded in patient's chart was 12.5 mg BID and therefore that was the reason for the confusion.  I cancelled Rx for Carvedilol 12.5 and refilled Carvedilol 6.25.  I advised her to call back with questions or concerns; wife verbalized understanding and agreement.

## 2014-04-30 ENCOUNTER — Ambulatory Visit (INDEPENDENT_AMBULATORY_CARE_PROVIDER_SITE_OTHER): Payer: Medicare Other | Admitting: *Deleted

## 2014-04-30 DIAGNOSIS — I4891 Unspecified atrial fibrillation: Secondary | ICD-10-CM

## 2014-04-30 DIAGNOSIS — Z5181 Encounter for therapeutic drug level monitoring: Secondary | ICD-10-CM

## 2014-04-30 LAB — POCT INR: INR: 3

## 2014-06-11 ENCOUNTER — Ambulatory Visit (INDEPENDENT_AMBULATORY_CARE_PROVIDER_SITE_OTHER): Payer: Medicare Other | Admitting: *Deleted

## 2014-06-11 DIAGNOSIS — Z5181 Encounter for therapeutic drug level monitoring: Secondary | ICD-10-CM

## 2014-06-11 DIAGNOSIS — I4891 Unspecified atrial fibrillation: Secondary | ICD-10-CM

## 2014-06-11 LAB — POCT INR: INR: 3.3

## 2014-06-29 ENCOUNTER — Other Ambulatory Visit: Payer: Self-pay | Admitting: Cardiovascular Disease

## 2014-07-01 ENCOUNTER — Encounter: Payer: Self-pay | Admitting: Pulmonary Disease

## 2014-07-01 ENCOUNTER — Other Ambulatory Visit: Payer: Self-pay | Admitting: Pulmonary Disease

## 2014-07-01 ENCOUNTER — Ambulatory Visit (INDEPENDENT_AMBULATORY_CARE_PROVIDER_SITE_OTHER): Payer: Medicare Other | Admitting: Pulmonary Disease

## 2014-07-01 VITALS — BP 122/70 | HR 97 | Temp 97.5°F | Ht 65.0 in | Wt 216.4 lb

## 2014-07-01 DIAGNOSIS — G4733 Obstructive sleep apnea (adult) (pediatric): Secondary | ICD-10-CM

## 2014-07-01 NOTE — Patient Instructions (Signed)
Will send to the sleep center for a mask fitting session.  Please take your old mask with you. Please call me after the mask fitting, and let me know if you are going to try to keep working with cpap. Work on further weight loss.  If you stay on the cpap, followup with me again in one year.

## 2014-07-01 NOTE — Progress Notes (Signed)
   Subjective:    Patient ID: Alex Orozco, male    DOB: November 03, 1929, 81 y.o.   MRN: [id number]  HPI Patient comes in today for follow-up of his very severe obstructive sleep apnea. He quit wearing C Pap back in August, and feels that he no longer needs the device. He feels that he sleeps well.'s main complaint was related to all of the headgear associated with a full face mask, and this gave him issues when he had shingles last year. However, his shingles have now healed with no significant postherpetic neuralgia.   Review of Systems  Constitutional: Negative for fever and unexpected weight change.  HENT: Negative for congestion, dental problem, ear pain, nosebleeds, postnasal drip, rhinorrhea, sinus pressure, sneezing, sore throat and trouble swallowing.   Eyes: Negative for redness and itching.  Respiratory: Negative for cough, chest tightness, shortness of breath and wheezing.   Cardiovascular: Negative for palpitations and leg swelling.  Gastrointestinal: Negative for nausea and vomiting.  Genitourinary: Negative for dysuria.  Musculoskeletal: Negative for joint swelling.  Skin: Negative for rash.  Neurological: Negative for headaches.  Hematological: Does not bruise/bleed easily.  Psychiatric/Behavioral: Negative for dysphoric mood. The patient is not nervous/anxious.        Objective:   Physical Exam  Obese male in no acute distress Nose without purulence or discharge noted Neck without lymphadenopathy or thyromegaly Lower extremities with mild edema, no cyanosis Awake, but does appear sleepy, moves all 4 extremities.      Assessment & Plan:

## 2014-07-01 NOTE — Assessment & Plan Note (Signed)
The patient has a history of very severe obstructive sleep apnea, along with concomitant heart disease. I suspect that his sleep apnea is a little better from his initial study since he has lost some weight, but by no means has his sleep apnea resolved. I have explained to the patient that even though he feels he does not have sleep apnea and does not have a sleeping problem, that he should stay on his C Pap device because of his underlying heart disease and the impact from obstructive sleep apnea. He is willing to try the C Pap again if we can get a better mask fit, and I will send him to the sleep Center for a fitting session. I have also encouraged him to continue working on weight loss.

## 2014-07-02 ENCOUNTER — Ambulatory Visit (INDEPENDENT_AMBULATORY_CARE_PROVIDER_SITE_OTHER): Payer: Medicare Other | Admitting: *Deleted

## 2014-07-02 DIAGNOSIS — I4891 Unspecified atrial fibrillation: Secondary | ICD-10-CM

## 2014-07-02 DIAGNOSIS — Z5181 Encounter for therapeutic drug level monitoring: Secondary | ICD-10-CM

## 2014-07-02 LAB — POCT INR: INR: 2.4

## 2014-07-04 ENCOUNTER — Other Ambulatory Visit: Payer: Self-pay | Admitting: Cardiovascular Disease

## 2014-07-13 ENCOUNTER — Ambulatory Visit (HOSPITAL_BASED_OUTPATIENT_CLINIC_OR_DEPARTMENT_OTHER): Payer: Medicare Other | Attending: Pulmonary Disease | Admitting: Radiology

## 2014-07-13 ENCOUNTER — Telehealth: Payer: Self-pay | Admitting: Pulmonary Disease

## 2014-07-13 DIAGNOSIS — Z9989 Dependence on other enabling machines and devices: Principal | ICD-10-CM

## 2014-07-13 DIAGNOSIS — G4733 Obstructive sleep apnea (adult) (pediatric): Secondary | ICD-10-CM

## 2014-07-13 NOTE — Telephone Encounter (Signed)
Noted  

## 2014-07-24 ENCOUNTER — Other Ambulatory Visit: Payer: Self-pay | Admitting: Cardiovascular Disease

## 2014-08-04 ENCOUNTER — Encounter: Payer: Self-pay | Admitting: Cardiovascular Disease

## 2014-08-04 ENCOUNTER — Other Ambulatory Visit: Payer: Self-pay | Admitting: Cardiovascular Disease

## 2014-08-04 ENCOUNTER — Ambulatory Visit (INDEPENDENT_AMBULATORY_CARE_PROVIDER_SITE_OTHER): Payer: Medicare Other | Admitting: Cardiovascular Disease

## 2014-08-04 ENCOUNTER — Ambulatory Visit (INDEPENDENT_AMBULATORY_CARE_PROVIDER_SITE_OTHER): Payer: Medicare Other | Admitting: Pharmacist Clinician (PhC)/ Clinical Pharmacy Specialist

## 2014-08-04 VITALS — BP 118/70 | HR 81 | Ht 65.0 in | Wt 218.5 lb

## 2014-08-04 DIAGNOSIS — I4891 Unspecified atrial fibrillation: Secondary | ICD-10-CM

## 2014-08-04 DIAGNOSIS — I5042 Chronic combined systolic (congestive) and diastolic (congestive) heart failure: Secondary | ICD-10-CM

## 2014-08-04 DIAGNOSIS — Z5181 Encounter for therapeutic drug level monitoring: Secondary | ICD-10-CM

## 2014-08-04 LAB — POCT INR: INR: 3.2

## 2014-08-04 MED ORDER — WARFARIN SODIUM 2.5 MG PO TABS: 2.5000 mg | ORAL_TABLET | Freq: Every day | ORAL | Status: AC

## 2014-08-04 NOTE — Progress Notes (Signed)
Cardiology Office Note   Date:  08/04/2014   ID:  Alex Orozco, DOB 09/15/1929, MRN [id number]  PCP:  Alex Mask, MD  Cardiologist:   Alex Mixer, MD   No chief complaint on file.  1. Coronary artery disease- s/p CABG, s/p stenting of SVG to D3. He has stenosis of native LCx. 2. Restrictive lung disease 3. Chronic renal insufficiency 4. Congestive heart failure-ejection fraction of 25-30% by echo. There is evidence of diastolic dysfunction. 5. Hypertension 6. Dyslipidemia 7. Moderate- severe  aortic stenosis A. Atrial fibrillation 9. History of strep bacteremia 10. History of anemia   History of Present Illness: He was admitted in April, 2013 with an episode of pneumonia and systolic congestive heart failure. He was discharge with Levofloxacin, steroid taper, and nebs. He saw Alex Orozco in his Lasix dose was increased to 60 mg twice a day. He had acute worsening of his renal function with a creatinine up to 2.0. His Lasix was Held for several doses. Currently he is on a Lasix 60 mg in the AM and 40 mg in the evening. He seems to be doing well with this dose.  He is feeling OK now. He complains of generalized fatigue.   He has had 5 injections of"rooster comb" into his left knee  No angina, breathing is ok. Wearing his CPAP at night. Using the "Lounge Doctor" regularly.  May 19, 20014:  Alex Orozco is doing OK. He still eats salt. BP is OK.   Oct. 14, 2014:  Alex Orozco is doing OK. He was recently in the hospital for CHF. He has done well - has lost 18 lbs since then. His leg edema has resolved. His BMP shows gradual worsening renal function.  July 28, 2013:  Alex Orozco has had some issues with congestive heart failure. He has seen Alex Orozco several times. He also has seen Alex Orozco for progressive renal insufficiency. He takes torsemide 40 mg twice a day. If he gains more than 3 pounds in one day or 5 pounds in weight he'll take 2.5 mg of Metalazone.    He seems to be holding his own.   October 29, 2013:  Alex Orozco is doing ok. Breathing is ok. Taking metolazone PRN weight gain.   Sept. 16, 2015:  Alex Orozco is doing ok. Is getting over the shingles.  Breathing is ok.  No cp.  Mild Leg swelling    August 04, 2014: Alex Orozco is a 81 y.o. male who presents for follow up of his CHF  Has knee pain and is limited  In his activities.  Chronic DOE,  Not able to do much.     Past Medical History  Diagnosis Date  . Coronary artery disease     a.  s/p CABG 1990;  b. NSTEMI 9/10:  LHC with S-RCA ok, S-D1 occluded (chronic), S-D3 occluded, L-LAD ok;  PCI:  Endeavour DES to S-D3;  c. 10/2011 Lexiscan MV EF 37%, medium sized fixed defect over basilar to mid lateral wall.  No ishcemia.   . Lung disease, restrictive   . History of PFTs 01/20/2009  . Pleural effusion   . Hypertension   . Dyslipidemia   . Aortic stenosis     a. echo 4/13: mild AS with mean 19 mmHg. b. echo 12/2012: Mild LVH, EF 25-30%, diffuse HK, moderate to severe aortic stenosis (mean gradient 16 mm of mercury), moderate MR, moderate LAE, mild RVE, mild reduced RVSF, moderate RAE, PASP 40  . Anemia   .  Difficult intubation   . Arthritis   . Epistaxis     a. 11/2012: adm with severe epistaxis requiring mechanical ventilation for airway protection c/b blood loss anemia  . Mitral regurgitation     a. Mod by echo 12/2012.  . CKD (chronic kidney disease), stage IV     Alex Orozco  . Permanent atrial fibrillation     a. Chronic coumadin  . Sleep apnea   . Chronic systolic CHF (congestive heart failure)     a. Previous EF 40-50%;  b. Echo 08/2011: mild LVH, EF 55-65, grade 3 diast dysfxn. c. EF 25-30% by echo 12/2012.    Past Surgical History  Procedure Laterality Date  . Coronary artery bypass graft  1999  . Coronary angioplasty with stent placement  2010    DES to the SVG-D3 (100%->0%). LAD 100%, CFX 90%, RCA 100%; LIMA-LAD OK, SVG-D1 100%, SVG-RCA OK  . Back surgery    .  Hernia repair    . Dccv    . Cataract extraction      bilateral  . Radiology with anesthesia N/A 12/13/2012    Procedure: RADIOLOGY WITH ANESTHESIA;  Surgeon: Oneal Grout, MD;  Location: [facility] OR;  Service: Radiology;  Laterality: N/A;     Current Outpatient Prescriptions  Medication Sig Dispense Refill  . allopurinol (ZYLOPRIM) 100 MG tablet Take 100 mg by mouth. Every other day    . carvedilol (COREG) 6.25 MG tablet Take 1 tablet (6.25 mg total) by mouth 2 (two) times daily. 180 tablet 3  . Cholecalciferol (VITAMIN D-3) 5000 UNITS TABS Take 1 tablet by mouth daily.     . CRESTOR 10 MG tablet TAKE 1 TABLET BY MOUTH EVERY EVENING 30 tablet 6  . doxazosin (CARDURA) 4 MG tablet Take 1 tablet (4 mg total) by mouth daily. 90 tablet 3  . famotidine (PEPCID) 10 MG tablet Take 10 mg by mouth every morning. For acid    . Ferrous Gluconate (IRON) 240 (27 FE) MG TABS Take 1 tablet by mouth daily.    . fish oil-omega-3 fatty acids 1000 MG capsule Take 1 g by mouth daily.     . hydrALAZINE (APRESOLINE) 10 MG tablet TAKE 1 AND 1/2 TABLETS BY MOUTH 3 TIMES DAILY 135 tablet 5  . hydroxychloroquine (PLAQUENIL) 200 MG tablet Take 200 mg by mouth daily.     . isosorbide mononitrate (IMDUR) 30 MG 24 hr tablet TAKE 1 TABLET BY MOUTH ONCE DAILY 30 tablet 6  . levalbuterol (XOPENEX) 0.63 MG/3ML nebulizer solution Take 3 mLs (0.63 mg total) by nebulization every 4 (four) hours as needed for wheezing. 3 mL 12  . metolazone (ZAROXOLYN) 2.5 MG tablet 1 tablet today and then only as directed 30 tablet 0  . potassium chloride SA (K-DUR,KLOR-CON) 20 MEQ tablet Take 1 tablet (20 mEq total) by mouth 2 (two) times daily. 180 tablet 2  . torsemide (DEMADEX) 20 MG tablet TAKE TWO TABLETS BY MOUTH TWICE DAILY 120 tablet 1  . VENTOLIN HFA 108 (90 BASE) MCG/ACT inhaler as needed.    . warfarin (COUMADIN) 2.5 MG tablet Take 1 tablet (2.5 mg total) by mouth daily. 90 tablet 1   No current facility-administered medications  for this visit.    Allergies:   Review of patient's allergies indicates no known allergies.    Social History:  The patient  reports that he has never smoked. He has never used smokeless tobacco. He reports that he does not drink alcohol or use illicit  drugs.   Family History:  The patient's family history includes Heart attack in his father; Heart failure in his mother; Hypertension in his father.    ROS:  Please see the history of present illness.    Review of Systems: Constitutional:  denies fever, chills, diaphoresis, appetite change and fatigue.  HEENT: denies photophobia, eye pain, redness, hearing loss, ear pain, congestion, sore throat, rhinorrhea, sneezing, neck pain, neck stiffness and tinnitus.  Respiratory: admits to SOB, DOE, cough, chest tightness, and wheezing.  Cardiovascular: denies chest pain, palpitations and leg swelling.  Gastrointestinal: denies nausea, vomiting, abdominal pain, diarrhea, constipation, blood in stool.  Genitourinary: denies dysuria, urgency, frequency, hematuria, flank pain and difficulty urinating.  Musculoskeletal: denies  myalgias, back pain, joint swelling, arthralgias and gait problem.   Skin: denies pallor, rash and wound.  Neurological: denies dizziness, seizures, syncope, weakness, light-headedness, numbness and headaches.   Hematological: denies adenopathy, easy bruising, personal or family bleeding history.  Psychiatric/ Behavioral: denies suicidal ideation, mood changes, confusion, nervousness, sleep disturbance and agitation.       All other systems are reviewed and negative.    PHYSICAL EXAM: VS:  BP 118/70 mmHg  Pulse 81  Ht 5\' 5"  (1.651 m)  Wt 218 lb 8 oz (99.111 kg)  BMI 36.36 kg/m2 , BMI Body mass index is 36.36 kg/(m^2). GEN:  , well developed, in no acute distress,  Chronically ill appearing  HEENT: normal Neck: no JVD, carotid bruits, or masses Cardiac: RRR; 2-3 / 6 systolic murmur ( has moderate - severe AS)  no  edema  Respiratory:  clear to auscultation bilaterally, normal work of breathing GI: soft, nontender, nondistended, + BS MS: no deformity or atrophy Skin: warm and dry, no rash Neuro:  Strength and sensation are intact Psych: normal   EKG:  EKG is ordered today. The ekg ordered today demonstrates Atrial fib at 81.  Occasional PVCs.    Recent Labs: No results found for requested labs within last 365 days.    Lipid Panel    Component Value Date/Time   CHOL 80 07/21/2013 0933   TRIG 65.0 07/21/2013 0933   HDL 39.60 07/21/2013 0933   CHOLHDL 2 07/21/2013 0933   VLDL 13.0 07/21/2013 0933   LDLCALC 27 07/21/2013 0933      Wt Readings from Last 3 Encounters:  08/04/14 218 lb 8 oz (99.111 kg)  07/01/14 216 lb 6.4 oz (98.158 kg)  02/04/14 211 lb 12.8 oz (96.072 kg)      Other studies Reviewed: Additional studies/ records that were reviewed today include: . Review of the above records demonstrates:    ASSESSMENT AND PLAN:  1. Coronary artery disease- s/p CABG, s/p stenting of SVG to D3. He has stenosis of native LCx. 2. Restrictive lung disease 3. Chronic renal insufficiency 4. Congestive heart failure-ejection fraction of 25-30% by echo. There is evidence of diastolic dysfunction. 5. Hypertension 6. Dyslipidemia 7. Moderate- severe  aortic stenosis A. Atrial fibrillation 9. History of strep bacteremia 10. History of anemia   Current medicines are reviewed at length with the patient today.  The patient does not have concerns regarding medicines.  The following changes have been made:  No changes.   Labs/ tests ordered today include:  No orders of the defined types were placed in this encounter.     Disposition:   FU with me  In 6 months .    Signed, Nahser, Deloris PingPhilip J, MD  08/04/2014 11:21 AM    [facility]  [address], [place], [place]  [address] Phone: [phone]; Fax: [phone]

## 2014-08-04 NOTE — Patient Instructions (Signed)
Your physician has requested that you have an echocardiogram. Echocardiography is a painless test that uses sound waves to create images of your heart. It provides your doctor with information about the size and shape of your heart and how well your heart's chambers and valves are working. This procedure takes approximately one hour. There are no restrictions for this procedure.  Your physician recommends that you continue on your current medications as directed. Please refer to the Current Medication list given to you today.  Your physician wants you to follow-up in: 6 months with Dr. Nahser.  You will receive a reminder letter in the mail two months in advance. If you don't receive a letter, please call our office to schedule the follow-up appointment.  

## 2014-08-10 ENCOUNTER — Other Ambulatory Visit (HOSPITAL_COMMUNITY): Payer: Medicare Other | Admitting: *Deleted

## 2014-08-10 ENCOUNTER — Ambulatory Visit (HOSPITAL_COMMUNITY): Payer: Medicare Other | Attending: Cardiology

## 2014-08-10 DIAGNOSIS — E119 Type 2 diabetes mellitus without complications: Secondary | ICD-10-CM | POA: Diagnosis not present

## 2014-08-10 DIAGNOSIS — I5042 Chronic combined systolic (congestive) and diastolic (congestive) heart failure: Secondary | ICD-10-CM

## 2014-08-10 DIAGNOSIS — E785 Hyperlipidemia, unspecified: Secondary | ICD-10-CM | POA: Insufficient documentation

## 2014-08-10 DIAGNOSIS — I10 Essential (primary) hypertension: Secondary | ICD-10-CM | POA: Diagnosis not present

## 2014-08-10 MED ORDER — PERFLUTREN LIPID MICROSPHERE
1.0000 mL | Freq: Once | INTRAVENOUS | Status: AC
  Administered 2014-08-10: 1 mL via INTRAVENOUS

## 2014-08-10 NOTE — Progress Notes (Signed)
2D Echo with Definity completed. 08/10/2014

## 2014-08-24 ENCOUNTER — Other Ambulatory Visit: Payer: Self-pay | Admitting: Cardiovascular Disease

## 2014-08-26 ENCOUNTER — Ambulatory Visit (INDEPENDENT_AMBULATORY_CARE_PROVIDER_SITE_OTHER): Payer: Medicare Other | Admitting: *Deleted

## 2014-08-26 DIAGNOSIS — I4891 Unspecified atrial fibrillation: Secondary | ICD-10-CM

## 2014-08-26 DIAGNOSIS — Z5181 Encounter for therapeutic drug level monitoring: Secondary | ICD-10-CM

## 2014-08-26 LAB — POCT INR: INR: 3.1

## 2014-09-16 ENCOUNTER — Ambulatory Visit (INDEPENDENT_AMBULATORY_CARE_PROVIDER_SITE_OTHER): Payer: Medicare Other | Admitting: Surgery

## 2014-09-16 DIAGNOSIS — I4891 Unspecified atrial fibrillation: Secondary | ICD-10-CM

## 2014-09-16 DIAGNOSIS — Z5181 Encounter for therapeutic drug level monitoring: Secondary | ICD-10-CM | POA: Diagnosis not present

## 2014-09-16 LAB — POCT INR: INR: 4.5

## 2014-09-21 ENCOUNTER — Other Ambulatory Visit: Payer: Self-pay | Admitting: Cardiovascular Disease

## 2014-09-24 ENCOUNTER — Ambulatory Visit (INDEPENDENT_AMBULATORY_CARE_PROVIDER_SITE_OTHER): Payer: Medicare Other | Admitting: *Deleted

## 2014-09-24 DIAGNOSIS — I4891 Unspecified atrial fibrillation: Secondary | ICD-10-CM | POA: Diagnosis not present

## 2014-09-24 DIAGNOSIS — Z5181 Encounter for therapeutic drug level monitoring: Secondary | ICD-10-CM | POA: Diagnosis not present

## 2014-09-24 LAB — POCT INR: INR: 2.1

## 2014-10-07 ENCOUNTER — Ambulatory Visit (INDEPENDENT_AMBULATORY_CARE_PROVIDER_SITE_OTHER): Payer: Medicare Other | Admitting: *Deleted

## 2014-10-07 DIAGNOSIS — I4891 Unspecified atrial fibrillation: Secondary | ICD-10-CM | POA: Diagnosis not present

## 2014-10-07 DIAGNOSIS — Z5181 Encounter for therapeutic drug level monitoring: Secondary | ICD-10-CM | POA: Diagnosis not present

## 2014-10-07 LAB — POCT INR: INR: 2.5

## 2014-10-29 ENCOUNTER — Ambulatory Visit (INDEPENDENT_AMBULATORY_CARE_PROVIDER_SITE_OTHER): Payer: Medicare Other | Admitting: *Deleted

## 2014-10-29 DIAGNOSIS — Z5181 Encounter for therapeutic drug level monitoring: Secondary | ICD-10-CM

## 2014-10-29 DIAGNOSIS — I4891 Unspecified atrial fibrillation: Secondary | ICD-10-CM

## 2014-10-29 LAB — POCT INR: INR: 2.3

## 2014-11-16 ENCOUNTER — Other Ambulatory Visit: Payer: Self-pay | Admitting: Cardiovascular Disease

## 2014-11-26 ENCOUNTER — Ambulatory Visit (INDEPENDENT_AMBULATORY_CARE_PROVIDER_SITE_OTHER): Payer: Medicare Other | Admitting: Surgery

## 2014-11-26 DIAGNOSIS — Z5181 Encounter for therapeutic drug level monitoring: Secondary | ICD-10-CM | POA: Diagnosis not present

## 2014-11-26 DIAGNOSIS — I4891 Unspecified atrial fibrillation: Secondary | ICD-10-CM

## 2014-11-26 LAB — POCT INR: INR: 2.9

## 2014-12-09 ENCOUNTER — Encounter (HOSPITAL_COMMUNITY): Payer: Self-pay | Admitting: Emergency Medicine

## 2014-12-09 ENCOUNTER — Emergency Department (HOSPITAL_COMMUNITY): Payer: Medicare Other

## 2014-12-09 ENCOUNTER — Observation Stay (HOSPITAL_COMMUNITY)
Admission: EM | Admit: 2014-12-09 | Discharge: 2014-12-10 | Disposition: A | Discharge status: Home / Self Care | Payer: Medicare Other | Attending: Internal Medicine | Admitting: Internal Medicine

## 2014-12-09 DIAGNOSIS — I5022 Chronic systolic (congestive) heart failure: Secondary | ICD-10-CM | POA: Diagnosis present

## 2014-12-09 DIAGNOSIS — Z66 Do not resuscitate: Secondary | ICD-10-CM | POA: Diagnosis not present

## 2014-12-09 DIAGNOSIS — Z7901 Long term (current) use of anticoagulants: Secondary | ICD-10-CM | POA: Diagnosis not present

## 2014-12-09 DIAGNOSIS — I2510 Atherosclerotic heart disease of native coronary artery without angina pectoris: Secondary | ICD-10-CM | POA: Insufficient documentation

## 2014-12-09 DIAGNOSIS — I129 Hypertensive chronic kidney disease with stage 1 through stage 4 chronic kidney disease, or unspecified chronic kidney disease: Secondary | ICD-10-CM | POA: Diagnosis not present

## 2014-12-09 DIAGNOSIS — I350 Nonrheumatic aortic (valve) stenosis: Secondary | ICD-10-CM | POA: Insufficient documentation

## 2014-12-09 DIAGNOSIS — Z951 Presence of aortocoronary bypass graft: Secondary | ICD-10-CM

## 2014-12-09 DIAGNOSIS — N184 Chronic kidney disease, stage 4 (severe): Secondary | ICD-10-CM | POA: Diagnosis not present

## 2014-12-09 DIAGNOSIS — R55 Syncope and collapse: Principal | ICD-10-CM | POA: Diagnosis present

## 2014-12-09 DIAGNOSIS — I482 Chronic atrial fibrillation, unspecified: Secondary | ICD-10-CM | POA: Diagnosis present

## 2014-12-09 DIAGNOSIS — E785 Hyperlipidemia, unspecified: Secondary | ICD-10-CM | POA: Diagnosis not present

## 2014-12-09 DIAGNOSIS — I10 Essential (primary) hypertension: Secondary | ICD-10-CM | POA: Diagnosis present

## 2014-12-09 LAB — URINALYSIS, ROUTINE W REFLEX MICROSCOPIC
BILIRUBIN URINE: NEGATIVE
GLUCOSE, UA: NEGATIVE mg/dL
HGB URINE DIPSTICK: NEGATIVE
KETONES UR: NEGATIVE mg/dL
Leukocytes, UA: NEGATIVE
Nitrite: NEGATIVE
Protein, ur: NEGATIVE mg/dL
Specific Gravity, Urine: 1.008 (ref 1.005–1.030)
UROBILINOGEN UA: 0.2 mg/dL (ref 0.0–1.0)
pH: 6.5 (ref 5.0–8.0)

## 2014-12-09 LAB — I-STAT TROPONIN, ED: Troponin i, poc: 0.1 ng/mL (ref 0.00–0.08)

## 2014-12-09 LAB — CBC WITH DIFFERENTIAL/PLATELET
Basophils Absolute: 0 10*3/uL (ref 0.0–0.1)
Basophils Relative: 0 % (ref 0–1)
Eosinophils Absolute: 0.3 10*3/uL (ref 0.0–0.7)
Eosinophils Relative: 3 % (ref 0–5)
HEMATOCRIT: 33.5 % — AB (ref 39.0–52.0)
HEMOGLOBIN: 10.7 g/dL — AB (ref 13.0–17.0)
Lymphocytes Relative: 6 % — ABNORMAL LOW (ref 12–46)
Lymphs Abs: 0.6 10*3/uL — ABNORMAL LOW (ref 0.7–4.0)
MCH: 31.7 pg (ref 26.0–34.0)
MCHC: 31.9 g/dL (ref 30.0–36.0)
MCV: 99.1 fL (ref 78.0–100.0)
Monocytes Absolute: 0.7 10*3/uL (ref 0.1–1.0)
Monocytes Relative: 8 % (ref 3–12)
Neutro Abs: 7.6 10*3/uL (ref 1.7–7.7)
Neutrophils Relative %: 83 % — ABNORMAL HIGH (ref 43–77)
PLATELETS: 110 10*3/uL — AB (ref 150–400)
RBC: 3.38 MIL/uL — ABNORMAL LOW (ref 4.22–5.81)
RDW: 15.6 % — ABNORMAL HIGH (ref 11.5–15.5)
WBC: 9.2 10*3/uL (ref 4.0–10.5)

## 2014-12-09 LAB — COMPREHENSIVE METABOLIC PANEL
ALK PHOS: 46 U/L (ref 38–126)
ALT: 17 U/L (ref 17–63)
AST: 22 U/L (ref 15–41)
Albumin: 3.3 g/dL — ABNORMAL LOW (ref 3.5–5.0)
Anion gap: 12 (ref 5–15)
BUN: 75 mg/dL — ABNORMAL HIGH (ref 6–20)
CO2: 28 mmol/L (ref 22–32)
CREATININE: 3.68 mg/dL — AB (ref 0.61–1.24)
Calcium: 9 mg/dL (ref 8.9–10.3)
Chloride: 100 mmol/L — ABNORMAL LOW (ref 101–111)
GFR calc Af Amer: 16 mL/min — ABNORMAL LOW (ref >60)
GFR calc non Af Amer: 14 mL/min — ABNORMAL LOW (ref >60)
Glucose, Bld: 109 mg/dL — ABNORMAL HIGH (ref 65–99)
POTASSIUM: 3.7 mmol/L (ref 3.5–5.1)
Sodium: 140 mmol/L (ref 135–145)
Total Bilirubin: 1.1 mg/dL (ref 0.3–1.2)
Total Protein: 5.9 g/dL — ABNORMAL LOW (ref 6.5–8.1)

## 2014-12-09 LAB — BASIC METABOLIC PANEL
Anion gap: 15 (ref 5–15)
BUN: 79 mg/dL — ABNORMAL HIGH (ref 6–20)
CALCIUM: 9.1 mg/dL (ref 8.9–10.3)
CHLORIDE: 98 mmol/L — AB (ref 101–111)
CO2: 28 mmol/L (ref 22–32)
CREATININE: 3.67 mg/dL — AB (ref 0.61–1.24)
GFR calc Af Amer: 16 mL/min — ABNORMAL LOW (ref >60)
GFR calc non Af Amer: 14 mL/min — ABNORMAL LOW (ref >60)
Glucose, Bld: 121 mg/dL — ABNORMAL HIGH (ref 65–99)
Potassium: 4.1 mmol/L (ref 3.5–5.1)
Sodium: 141 mmol/L (ref 135–145)

## 2014-12-09 LAB — CBC
HEMATOCRIT: 35.5 % — AB (ref 39.0–52.0)
Hemoglobin: 11.2 g/dL — ABNORMAL LOW (ref 13.0–17.0)
MCH: 30.9 pg (ref 26.0–34.0)
MCHC: 31.5 g/dL (ref 30.0–36.0)
MCV: 98.1 fL (ref 78.0–100.0)
PLATELETS: 120 10*3/uL — AB (ref 150–400)
RBC: 3.62 MIL/uL — AB (ref 4.22–5.81)
RDW: 15.5 % (ref 11.5–15.5)
WBC: 8.3 10*3/uL (ref 4.0–10.5)

## 2014-12-09 LAB — TROPONIN I
Troponin I: 0.11 ng/mL — ABNORMAL HIGH (ref <0.031)
Troponin I: 0.09 ng/mL — ABNORMAL HIGH (ref <0.031)
Troponin I: 0.11 ng/mL — ABNORMAL HIGH (ref <0.031)

## 2014-12-09 LAB — BRAIN NATRIURETIC PEPTIDE: B Natriuretic Peptide: 1103 pg/mL — ABNORMAL HIGH (ref 0.0–100.0)

## 2014-12-09 LAB — CBG MONITORING, ED: GLUCOSE-CAPILLARY: 124 mg/dL — AB (ref 65–99)

## 2014-12-09 LAB — PROTIME-INR
INR: 2.07 — AB (ref 0.00–1.49)
Prothrombin Time: 23.1 seconds — ABNORMAL HIGH (ref 11.6–15.2)

## 2014-12-09 LAB — TSH: TSH: 3.578 u[IU]/mL (ref 0.350–4.500)

## 2014-12-09 MED ORDER — HYDROXYCHLOROQUINE SULFATE 200 MG PO TABS
200.0000 mg | ORAL_TABLET | Freq: Every day | ORAL | Status: DC
  Administered 2014-12-09 – 2014-12-10 (×2): 200 mg via ORAL
  Filled 2014-12-09 (×2): qty 1

## 2014-12-09 MED ORDER — ONDANSETRON HCL 4 MG PO TABS: 4.0000 mg | ORAL_TABLET | Freq: Four times a day (QID) | ORAL | Status: DC | PRN

## 2014-12-09 MED ORDER — ISOSORBIDE MONONITRATE ER 30 MG PO TB24
30.0000 mg | ORAL_TABLET | Freq: Every day | ORAL | Status: DC
  Administered 2014-12-09 – 2014-12-10 (×2): 30 mg via ORAL
  Filled 2014-12-09 (×2): qty 1

## 2014-12-09 MED ORDER — WARFARIN 1.25 MG HALF TABLET: 1.2500 mg | ORAL_TABLET | ORAL | Status: DC

## 2014-12-09 MED ORDER — WARFARIN SODIUM 2.5 MG PO TABS
2.5000 mg | ORAL_TABLET | ORAL | Status: DC
  Administered 2014-12-09: 2.5 mg via ORAL
  Filled 2014-12-09 (×2): qty 1

## 2014-12-09 MED ORDER — ALLOPURINOL 100 MG PO TABS
100.0000 mg | ORAL_TABLET | Freq: Every day | ORAL | Status: DC
  Administered 2014-12-09 – 2014-12-10 (×2): 100 mg via ORAL
  Filled 2014-12-09 (×2): qty 1

## 2014-12-09 MED ORDER — ACETAMINOPHEN 325 MG PO TABS: 325.0000 mg | ORAL_TABLET | Freq: Once | ORAL | Status: DC

## 2014-12-09 MED ORDER — ACETAMINOPHEN 325 MG PO TABS
650.0000 mg | ORAL_TABLET | Freq: Four times a day (QID) | ORAL | Status: DC | PRN
  Administered 2014-12-09 – 2014-12-10 (×2): 650 mg via ORAL
  Filled 2014-12-09 (×2): qty 2

## 2014-12-09 MED ORDER — CARVEDILOL 6.25 MG PO TABS
6.2500 mg | ORAL_TABLET | Freq: Two times a day (BID) | ORAL | Status: DC
  Administered 2014-12-09 (×2): 6.25 mg via ORAL
  Filled 2014-12-09 (×3): qty 1

## 2014-12-09 MED ORDER — TORSEMIDE 20 MG PO TABS
40.0000 mg | ORAL_TABLET | Freq: Two times a day (BID) | ORAL | Status: DC
  Administered 2014-12-09 – 2014-12-10 (×3): 40 mg via ORAL
  Filled 2014-12-09 (×5): qty 2

## 2014-12-09 MED ORDER — ONDANSETRON HCL 4 MG/2ML IJ SOLN: 4.0000 mg | Freq: Four times a day (QID) | INTRAMUSCULAR | Status: DC | PRN

## 2014-12-09 MED ORDER — ACETAMINOPHEN 325 MG PO TABS
650.0000 mg | ORAL_TABLET | Freq: Once | ORAL | Status: AC
  Administered 2014-12-09: 650 mg via ORAL
  Filled 2014-12-09: qty 2

## 2014-12-09 MED ORDER — VITAMIN D 1000 UNITS PO TABS
5000.0000 [IU] | ORAL_TABLET | Freq: Every day | ORAL | Status: DC
  Administered 2014-12-09 – 2014-12-10 (×2): 5000 [IU] via ORAL
  Filled 2014-12-09 (×2): qty 5

## 2014-12-09 MED ORDER — FERROUS SULFATE 325 (65 FE) MG PO TABS
325.0000 mg | ORAL_TABLET | Freq: Every day | ORAL | Status: DC
  Administered 2014-12-09 – 2014-12-10 (×2): 325 mg via ORAL
  Filled 2014-12-09 (×2): qty 1

## 2014-12-09 MED ORDER — SODIUM CHLORIDE 0.9 % IJ SOLN
3.0000 mL | Freq: Two times a day (BID) | INTRAMUSCULAR | Status: DC
  Administered 2014-12-09 – 2014-12-10 (×3): 3 mL via INTRAVENOUS

## 2014-12-09 MED ORDER — ACETAMINOPHEN 650 MG RE SUPP: 650.0000 mg | Freq: Four times a day (QID) | RECTAL | Status: DC | PRN

## 2014-12-09 MED ORDER — POTASSIUM CHLORIDE CRYS ER 20 MEQ PO TBCR
20.0000 meq | EXTENDED_RELEASE_TABLET | Freq: Two times a day (BID) | ORAL | Status: DC
  Administered 2014-12-09 – 2014-12-10 (×3): 20 meq via ORAL
  Filled 2014-12-09 (×4): qty 1

## 2014-12-09 MED ORDER — HYDRALAZINE HCL 10 MG PO TABS
15.0000 mg | ORAL_TABLET | Freq: Three times a day (TID) | ORAL | Status: DC
  Administered 2014-12-09 – 2014-12-10 (×4): 15 mg via ORAL
  Filled 2014-12-09 (×7): qty 2

## 2014-12-09 MED ORDER — DOXAZOSIN MESYLATE 4 MG PO TABS
4.0000 mg | ORAL_TABLET | Freq: Every day | ORAL | Status: DC
  Administered 2014-12-09 – 2014-12-10 (×2): 4 mg via ORAL
  Filled 2014-12-09 (×2): qty 1

## 2014-12-09 MED ORDER — OMEGA-3-ACID ETHYL ESTERS 1 G PO CAPS
1.0000 g | ORAL_CAPSULE | Freq: Every day | ORAL | Status: DC
  Administered 2014-12-09 – 2014-12-10 (×2): 1 g via ORAL
  Filled 2014-12-09 (×2): qty 1

## 2014-12-09 MED ORDER — WARFARIN - PHARMACIST DOSING INPATIENT: Freq: Every day | Status: DC

## 2014-12-09 MED ORDER — ROSUVASTATIN CALCIUM 10 MG PO TABS
10.0000 mg | ORAL_TABLET | Freq: Every evening | ORAL | Status: DC
  Administered 2014-12-09: 10 mg via ORAL
  Filled 2014-12-09 (×2): qty 1

## 2014-12-09 MED ORDER — FAMOTIDINE 10 MG PO TABS
10.0000 mg | ORAL_TABLET | Freq: Every morning | ORAL | Status: DC
  Administered 2014-12-09 – 2014-12-10 (×2): 10 mg via ORAL
  Filled 2014-12-09 (×2): qty 1

## 2014-12-09 NOTE — H&P (Addendum)
[facility] History and Physical  Alex AlmRoy L Chismar ZOX:[id number] DOB: May 06, 1930 DOA: 12/09/2014  Referring physician: Dr. Littie DeedsGentry. PCP: Kaleen MaskELKINS,WILSON OLIVER, MD  Specialists: Dr.Nahser. Cardiologist.  Chief Complaint: Fall and loss of consciousness.  HPI: Alex Orozco is a 81 y.o. male with history of CAD status post CABG status post stenting, chronic atrial fibrillation, chronic kidney disease stage IV, chronic systolic heart failure and severe aortic stenosis was brought to the ER after patient had a fall and syncope. Last evening patient was in the bathroom and returning back patient suddenly lost consciousness and hit his head against the bathroom tub. Patient has a small laceration and periorbital hematoma around the left eye. Patient states he does not know what made him fall. Patient's daughter felt that his heart rate was mildly elevated when he fell. Patient may have lost consciousness for 3-4 minutes. Denies any chest pain palpitations nausea vomiting abdominal pain diarrhea fever chills. EKG shows atrial fibrillation and CT head shows periorbital hematoma. Patient has been admitted for further management of syncope.  Review of Systems: As presented in the history of presenting illness, rest negative.  Past Medical History  Diagnosis Date  . Coronary artery disease     a.  s/p CABG 1990;  b. NSTEMI 9/10:  LHC with S-RCA ok, S-D1 occluded (chronic), S-D3 occluded, L-LAD ok;  PCI:  Endeavour DES to S-D3;  c. 10/2011 Lexiscan MV EF 37%, medium sized fixed defect over basilar to mid lateral wall.  No ishcemia.   . Lung disease, restrictive   . History of PFTs 01/20/2009  . Pleural effusion   . Hypertension   . Dyslipidemia   . Aortic stenosis     a. echo 4/13: mild AS with mean 19 mmHg. b. echo 12/2012: Mild LVH, EF 25-30%, diffuse HK, moderate to severe aortic stenosis (mean gradient 16 mm of mercury), moderate MR, moderate LAE, mild RVE, mild reduced RVSF, moderate RAE, PASP 40  .  Anemia   . Difficult intubation   . Arthritis   . Epistaxis     a. 11/2012: adm with severe epistaxis requiring mechanical ventilation for airway protection c/b blood loss anemia  . Mitral regurgitation     a. Mod by echo 12/2012.  . CKD (chronic kidney disease), stage IV     Dr. Lowell GuitarPowell  . Permanent atrial fibrillation     a. Chronic coumadin  . Sleep apnea   . Chronic systolic CHF (congestive heart failure)     a. Previous EF 40-50%;  b. Echo 08/2011: mild LVH, EF 55-65, grade 3 diast dysfxn. c. EF 25-30% by echo 12/2012.   Past Surgical History  Procedure Laterality Date  . Coronary artery bypass graft  1999  . Coronary angioplasty with stent placement  2010    DES to the SVG-D3 (100%->0%). LAD 100%, CFX 90%, RCA 100%; LIMA-LAD OK, SVG-D1 100%, SVG-RCA OK  . Back surgery    . Hernia repair    . Dccv    . Cataract extraction      bilateral  . Radiology with anesthesia N/A 12/13/2012    Procedure: RADIOLOGY WITH ANESTHESIA;  Surgeon: Oneal GroutSanjeev K Deveshwar, MD;  Location: [facility] OR;  Service: Radiology;  Laterality: N/A;   Social History:  reports that he has never smoked. He has never used smokeless tobacco. He reports that he does not drink alcohol or use illicit drugs. Where does patient live home. Can patient participate in ADLs? Yes.  No Known Allergies  Family History:  Family  History  Problem Relation Age of Onset  . Heart failure Mother   . Heart attack Father   . Hypertension Father       Prior to Admission medications   Medication Sig Start Date End Date Taking? Authorizing Provider  allopurinol (ZYLOPRIM) 100 MG tablet Take 100 mg by mouth. Every other day 02/03/14  Yes Historical Provider, MD  carvedilol (COREG) 6.25 MG tablet Take 1 tablet (6.25 mg total) by mouth 2 (two) times daily. 04/01/14  Yes Vesta Mixer, MD  Cholecalciferol (VITAMIN D-3) 5000 UNITS TABS Take 1 tablet by mouth daily.    Yes Historical Provider, MD  CRESTOR 10 MG tablet TAKE 1 TABLET BY MOUTH  EVERY EVENING 07/01/14  Yes Vesta Mixer, MD  doxazosin (CARDURA) 4 MG tablet Take 1 tablet (4 mg total) by mouth daily. 03/04/14  Yes Vesta Mixer, MD  famotidine (PEPCID) 10 MG tablet Take 10 mg by mouth every morning. For acid   Yes Historical Provider, MD  Ferrous Gluconate (IRON) 240 (27 FE) MG TABS Take 1 tablet by mouth daily.   Yes Historical Provider, MD  fish oil-omega-3 fatty acids 1000 MG capsule Take 1 g by mouth daily.    Yes Historical Provider, MD  hydrALAZINE (APRESOLINE) 10 MG tablet TAKE 1 AND 1/2 TABLETS BY MOUTH 3 TIMES DAILY 08/24/14  Yes Vesta Mixer, MD  hydroxychloroquine (PLAQUENIL) 200 MG tablet Take 200 mg by mouth daily.    Yes Historical Provider, MD  isosorbide mononitrate (IMDUR) 30 MG 24 hr tablet TAKE 1 TABLET BY MOUTH ONCE DAILY 07/01/14  Yes Vesta Mixer, MD  metolazone (ZAROXOLYN) 2.5 MG tablet 1 tablet today and then only as directed Patient taking differently: Take 2.5 mg by mouth daily as needed (for fluid).  05/28/13  Yes Dyann Kief, PA-C  potassium chloride SA (K-DUR,KLOR-CON) 20 MEQ tablet TAKE ONE TABLET BY MOUTH TWICE DAILY 11/18/14  Yes Vesta Mixer, MD  torsemide (DEMADEX) 20 MG tablet TAKE 2 TABLETS BY MOUTH TWICE DAILY 09/22/14  Yes Vesta Mixer, MD  warfarin (COUMADIN) 2.5 MG tablet Take 1 tablet (2.5 mg total) by mouth daily. Patient taking differently: Take 1.25-2.5 mg by mouth at bedtime. Take 1/2 tablet on Monday and Friday then take 1 tablet the rest of the week 08/04/14  Yes Vesta Mixer, MD  levalbuterol Pauline Aus) 0.63 MG/3ML nebulizer solution Take 3 mLs (0.63 mg total) by nebulization every 4 (four) hours as needed for wheezing. Patient not taking: Reported on 12/09/2014 12/20/12   Jeanella Craze, NP    Physical Exam: Filed Vitals:   12/09/14 0345 12/09/14 0417 12/09/14 0430 12/09/14 0459  BP: 130/71 110/71 135/73 138/84  Pulse: 86 88 81 82  Temp:    97.9 F (36.6 C)  TempSrc:    Oral  Resp: 25 24 23 22   Height:    5'  6" (1.676 m)  Weight:    99.519 kg (219 lb 6.4 oz)  SpO2: 95% 97% 100% 98%     General:  Moderately built and nourished.  Eyes: Anicteric. No pallor. Left periorbital hematoma with laceration.  ENT: No discharge from the ears eyes nose and mouth.  Neck: No mass felt.  Cardiovascular: S1 and S2 heard.  Respiratory: No rhonchi or crepitations.  Abdomen: Soft nontender bowel sounds present.  Skin: Left periorbital hematoma.  Musculoskeletal: No edema.  Psychiatric: Appears normal.  Neurologic: Alert awake oriented to time place and person. Moves all extremities.  Labs  on Admission:  Basic Metabolic Panel:  Recent Labs Lab 12/09/14 0058  NA 141  K 4.1  CL 98*  CO2 28  GLUCOSE 121*  BUN 79*  CREATININE 3.67*  CALCIUM 9.1   Liver Function Tests: No results for input(s): AST, ALT, ALKPHOS, BILITOT, PROT, ALBUMIN in the last 168 hours. No results for input(s): LIPASE, AMYLASE in the last 168 hours. No results for input(s): AMMONIA in the last 168 hours. CBC:  Recent Labs Lab 12/09/14 0058  WBC 8.3  HGB 11.2*  HCT 35.5*  MCV 98.1  PLT 120*   Cardiac Enzymes: No results for input(s): CKTOTAL, CKMB, CKMBINDEX, TROPONINI in the last 168 hours.  BNP (last 3 results) No results for input(s): BNP in the last 8760 hours.  ProBNP (last 3 results) No results for input(s): PROBNP in the last 8760 hours.  CBG:  Recent Labs Lab 12/09/14 0039  GLUCAP 124*    Radiological Exams on Admission: Dg Chest 2 View  12/09/2014   CLINICAL DATA:  Syncope. Hit face in bathroom. Concern for chest injury. Initial encounter.  EXAM: CHEST  2 VIEW  COMPARISON:  Chest radiograph performed 05/12/2013  FINDINGS: Mild vascular congestion is noted. Patchy bilateral central airspace opacities may reflect mild interstitial edema. No pleural effusion or pneumothorax is seen.  The cardiomediastinal silhouette is mildly enlarged. The patient is status post median sternotomy. No acute  osseous abnormalities are identified. Degenerative change is noted at both glenohumeral joints.  IMPRESSION: Mild vascular congestion and mild cardiomegaly noted. Patchy bilateral central airspace opacities may reflect mild interstitial edema.   Electronically Signed   By: Roanna Raider M.D.   On: 12/09/2014 05:16   Ct Head Wo Contrast  12/09/2014   CLINICAL DATA:  Initial evaluation for acute trauma, fall.  EXAM: CT HEAD WITHOUT CONTRAST  TECHNIQUE: Contiguous axial images were obtained from the base of the skull through the vertex without intravenous contrast.  COMPARISON:  None.  FINDINGS: Diffuse prominence of the CSF containing spaces is compatible with generalized cerebral atrophy. Patchy hypodensity within the periventricular and deep white matter both cerebral hemispheres present, most consistent with chronic small vessel ischemic disease. Prominent atheromatous plaque present within the carotid siphons and vertebrobasilar system.  No acute large vessel territory infarct or intracranial hemorrhage. No mass lesion, midline shift, or mass effect. No hydrocephalus. No extra-axial fluid collection para  Prominent left periorbital contusion present. Globes are intact. Sequelae of prior lens extraction noted bilaterally.  Calvarium intact.  Mild layering opacity present within the left sphenoid sinus. Paranasal sinuses are otherwise clear. No mastoid effusion.  IMPRESSION: 1. No acute intracranial process. 2. Left periorbital soft tissue contusion. 3. Generalized cerebral atrophy with chronic microvascular ischemic disease.   Electronically Signed   By: Rise Mu M.D.   On: 12/09/2014 02:56    EKG: Independently reviewed. Atrial fibrillation rate controlled.  Assessment/Plan Principal Problem:   Syncope Active Problems:   Essential hypertension   Aortic stenosis   S/P CABG x 5   CKD (chronic kidney disease), stage IV   Chronic systolic CHF (congestive heart failure)   Chronic atrial  fibrillation   1. Syncope - given that patient has history of severe aortic stenosis at this time I have consulted cardiology to give their opinion. Patient also has history of CAD so observe in telemetry for any arrhythmia. Check orthostatics and get physical therapy consult. 2. CAD status post CABG and stenting troponins are mildly elevated - cycle cardiac markers. 3. Chronic kidney  disease stage IV - on torsemide. Closely follow metabolic panel. 4. Chronic systolic heart failure last EF measured was 25-30% this March - closely follow intake output metabolic panel and daily weights. Patient is on torsemide. 5. Severe aortic stenosis - see #1. 6. Hypertension - continue home medications. 7. Chronic atrial fibrillation - Coumadin per pharmacy. 8. Left periorbital hematoma - closely observe as patient is on anticoagulation.   DVT Prophylaxis Coumadin.  Code Status: Partial Code - CPR and medications OK but NO INTUBATION. Family Communication: Discussed with daughter and patient's wife.  Disposition Plan: Admit for observation.    Jonnette Nuon N. [facility] Pager [phone].  If 7PM-7AM, please contact night-coverage [email or web address] Password [facility] 12/09/2014, 5:49 AM

## 2014-12-09 NOTE — Progress Notes (Signed)
[facility] PROGRESS NOTE  Alex Orozco WGN:[id number] DOB: [birth year] DOA: 12/09/2014 PCP: Kaleen Mask, MD  Assessment/Plan: Syncope - - Patient has multiple medical conditions to include severe aortic stenosis and chronic systolic heart failure having ejection fraction of 25%, along with the possibility of cardiac arrhythmias that could have led to syncopal episode.  - Cardiology consult, did not feel he was good candidate for aortic valve replacement -Check 2d echo for re-evaluation of cardiac function given syncopal episode and significant cardiac history.  -Continue to monitor for the next 24 hours to exclude cardiac arrhythmias such as pauses which may have contributed to syncope - PT consult for evaluation of future fall risk  CAD status post CABG and stenting   - Tropinin stable at 0.1, 0.11, and 0.9 - Patient denies chest pain.  - Cardiology consulted - Repeat 2d echo   Chronic kidney disease stage IV - - Serum Creatinine was 3.67 today. Difficult to ascertain baseline creatinine, per review it appears that serum creatinine has steadily been rising over the last few years and this may be a reflection of progressing renal dysfunction  - Continue torsemide.  -Closely monitor kidney function with BMP.  Chronic systolic heart failure  - Last echo was March 2016 with EF 25-30%; repeat echo to evaluate for acute changes.  - Since admission, weight 220lbs (unchanged); net +380ml - Patient has 1-2+ pitting edema in lower extremities. Denies shortness of breath or chest pain.  - Patient is currently on torsemide 40 mg PO BID - Repeat 2d echo - Continue to monitor daily weights, I/O, BPs, and BMPs  Severe aortic stenosis  -Patient with history of severe aortic stenosis, last transthoracic echocardiogram performed on 08/10/2014 that showed a calculated uric valve area of 0.7 cm that would be consistent with severe aortic stenosis. Echo also revealed an ejection fraction  of 25-30%. He presents with syncope as it is conceivable that underlying aortic stenosis and CHF may have led to his syncopal event. -Cardiology was consulted regarding their opinion on further management of severe aortic stenosis. He was not felt to be be a good candidate for aortic valve replacement. Transaortic valve replacement could be a consideration however workup will require cardiac catheterization which could be an issue with advanced kidney disease.  -Cardiology recommending a follow-up with his primary cardiologist for further discussions on this matter  Hypertension  - BPs have remained stable in 130s/70s - Continue torsemide, monitor kidney function with BMP  Chronic atrial fibrillation  - Continue telemetry -Patient anticoagulated with Coumadin. Having a stable INR of 2.07 with PT of 23.1 - Coumadin per pharmacy  Left periorbital hematoma  - On coumadin - Denies headache, dizziness,weakness, or changes in vision.  - Closely monitor hematoma for progression -Dressing changes  Code Status: FULL Family Communication: wife, daughter, and son-in-law Disposition Plan: Anticipate discharge home in next 1-2 days.    Consultants:  Cardiology  Procedures:  2d echo  Antibiotics:  None  HPI/Subjective: Alex Orozco is an 81yo male with history of CAD s/p CABG, chronic atrial fibrillation, CKD stage IV, systolic HF with EF 25-30% (March 2016), aortic stenosis, h/o melanoma, tophaceous gout, and rheumatoid arthritis presenting to ED for fall and loss of consciousness. He reports getting up from using the restroom, flushing, and having a syncopal episode. He denies any preceeding symptoms. In the ED, CT significant head showed periorbital hematoma.   Today, patient is in good spirits, slightly hard of hearing due to not having hearing aids. He  felt slightly warm last night, but denies fever, chills, or night sweats this morning. He denies chest pain, palpitations, shortness of  breath, abdominal pain, nausea, vomiting, diarrhea, constipation, urinary complaints, dizziness, or weakness. He has chronic joint pain attributed to RA.   Objective: Filed Vitals:   12/09/14 1013  BP: 131/79  Pulse:   Temp:   Resp:     Intake/Output Summary (Last 24 hours) at 12/09/14 1420 Last data filed at 12/09/14 1022  Gross per 24 hour  Intake    175 ml  Output    350 ml  Net   -175 ml   Filed Weights   12/09/14 0101 12/09/14 0459  Weight: 99.791 kg (220 lb) 99.519 kg (219 lb 6.4 oz)    Exam:  General:  Alex Orozco is an 81yo male, reclining comfortably in bed in good spirits. He is alert and oriented and in no acute distress.  Cardiovascular: irregularly irregular. 3/6 systolic murmur with radiation to carotids. 2+ peripheral pulses, 1-2+ pitting edema Respiratory: Clear to auscultation bilaterally. No crackles, rhonchi, or wheezes.  Abdomen: Normactive bowel sounds heard in all four quadrants. Soft, non-distended, and non-tender to palpation. No gaurding.  Musculoskeletal: moves all four. Tophi noted on hands and left elbow. Ecchymosis noted on knees b/l.  Skin: Significant ecchymosis surrounding left periorbital region with 2-3cm laceration in temporal region.   Data Reviewed: Basic Metabolic Panel:  Recent Labs Lab 12/09/14 0058 12/09/14 0622  NA 141 140  K 4.1 3.7  CL 98* 100*  CO2 28 28  GLUCOSE 121* 109*  BUN 79* 75*  CREATININE 3.67* 3.68*  CALCIUM 9.1 9.0   Liver Function Tests:  Recent Labs Lab 12/09/14 0622  AST 22  ALT 17  ALKPHOS 46  BILITOT 1.1  PROT 5.9*  ALBUMIN 3.3*   No results for input(s): LIPASE, AMYLASE in the last 168 hours. No results for input(s): AMMONIA in the last 168 hours. CBC:  Recent Labs Lab 12/09/14 0058 12/09/14 0622  WBC 8.3 9.2  NEUTROABS  --  7.6  HGB 11.2* 10.7*  HCT 35.5* 33.5*  MCV 98.1 99.1  PLT 120* 110*   Cardiac Enzymes:  Recent Labs Lab 12/09/14 0622 12/09/14 1113  TROPONINI 0.11* 0.09*    BNP (last 3 results) No results for input(s): BNP in the last 8760 hours.  ProBNP (last 3 results) No results for input(s): PROBNP in the last 8760 hours.  CBG:  Recent Labs Lab 12/09/14 0039  GLUCAP 124*    No results found for this or any previous visit (from the past 240 hour(s)).   Studies: Dg Chest 2 View  12/09/2014   CLINICAL DATA:  Syncope. Hit face in bathroom. Concern for chest injury. Initial encounter.  EXAM: CHEST  2 VIEW  COMPARISON:  Chest radiograph performed 05/12/2013  FINDINGS: Mild vascular congestion is noted. Patchy bilateral central airspace opacities may reflect mild interstitial edema. No pleural effusion or pneumothorax is seen.  The cardiomediastinal silhouette is mildly enlarged. The patient is status post median sternotomy. No acute osseous abnormalities are identified. Degenerative change is noted at both glenohumeral joints.  IMPRESSION: Mild vascular congestion and mild cardiomegaly noted. Patchy bilateral central airspace opacities may reflect mild interstitial edema.   Electronically Signed   By: Roanna Raider M.D.   On: 12/09/2014 05:16   Ct Head Wo Contrast  12/09/2014   CLINICAL DATA:  Initial evaluation for acute trauma, fall.  EXAM: CT HEAD WITHOUT CONTRAST  TECHNIQUE: Contiguous axial images were obtained  from the base of the skull through the vertex without intravenous contrast.  COMPARISON:  None.  FINDINGS: Diffuse prominence of the CSF containing spaces is compatible with generalized cerebral atrophy. Patchy hypodensity within the periventricular and deep white matter both cerebral hemispheres present, most consistent with chronic small vessel ischemic disease. Prominent atheromatous plaque present within the carotid siphons and vertebrobasilar system.  No acute large vessel territory infarct or intracranial hemorrhage. No mass lesion, midline shift, or mass effect. No hydrocephalus. No extra-axial fluid collection para  Prominent left periorbital  contusion present. Globes are intact. Sequelae of prior lens extraction noted bilaterally.  Calvarium intact.  Mild layering opacity present within the left sphenoid sinus. Paranasal sinuses are otherwise clear. No mastoid effusion.  IMPRESSION: 1. No acute intracranial process. 2. Left periorbital soft tissue contusion. 3. Generalized cerebral atrophy with chronic microvascular ischemic disease.   Electronically Signed   By: Rise MuBenjamin  McClintock M.D.   On: 12/09/2014 02:56    Scheduled Meds: . allopurinol  100 mg Oral Daily  . carvedilol  6.25 mg Oral BID WC  . cholecalciferol  5,000 Units Oral Daily  . doxazosin  4 mg Oral Daily  . famotidine  10 mg Oral q morning - 10a  . ferrous sulfate  325 mg Oral Daily  . hydrALAZINE  15 mg Oral 3 times per day  . hydroxychloroquine  200 mg Oral Daily  . isosorbide mononitrate  30 mg Oral Daily  . omega-3 acid ethyl esters  1 g Oral Daily  . potassium chloride SA  20 mEq Oral BID  . rosuvastatin  10 mg Oral QPM  . sodium chloride  3 mL Intravenous Q12H  . torsemide  40 mg Oral BID  . [START ON 02-08-15] warfarin  1.25 mg Oral Once per day on Mon Fri  . warfarin  2.5 mg Oral Once per day on Sun Tue Wed Thu Sat  . Warfarin - Pharmacist Dosing Inpatient   Does not apply q1800   Continuous Infusions:   Principal Problem:   Syncope Active Problems:   Essential hypertension   Aortic stenosis   S/P CABG x 5   CKD (chronic kidney disease), stage IV   Chronic systolic CHF (congestive heart failure)   Chronic atrial fibrillation    Time spent: 35 minutes    Deborha PaymentMeyer, Ashley L PA-Student   [facility] Pager [phone]. If 7PM-7AM, please contact night-coverage at [email or web address], password [email or web address] 12/09/2014, 2:20 PM     Addendum  I personally evaluated patient on 12/09/2014 and agree with the above findings. Alex Orozco is a pleasant 81 year old gentleman with an established history of coronary artery disease, stage IV chronic kidney disease,  severe aortic stenosis, systolic congestive heart failure having an ejection fraction of 25-30% based on echo from March 2060 who was brought to the emergency department after having a syncopal events at home. He reported this occurred right after urinating at night. Transthoracic echocardiogram obtained on 08/10/2014 showing a severely reduced ejection fraction of 25-30% along with evidence of severe aortic stenosis having AVA of 0.7 cm. Cardiology was consulted for further input on management of severe aortic stenosis. Patient not felt to be good candidate for aortic valve replacement and although TAVR could be considered this will require cardiac catheterization which could possibly worsen renal function. Patient adamant about not undergoing hemodialysis. He expresses wishes to follow-up with his primary cardiologist in the outpatient setting for further discussions. Awaiting repeat transthoracic echocardiogram at the time of this dictation. On physical  examination he was awake and alert, had a 3-6 systolic ejection murmur with radiation to the carotids. He also has left periorbital hematoma along with left temporal skin tear that resulted from his fall. Will continue medical management with Coreg 6.25 mg by mouth twice a day, hydralazine 50 mg by mouth every 8 hours, Imdur 30 mg by mouth daily, torsemide 40 mg twice a day and Crestor 10 mg daily. He is also anticoagulated with warfarin having therapeutic INR of 2.07.

## 2014-12-09 NOTE — Consult Note (Signed)
Primary cardiologist: Dr Acie Fredrickson  HPI: 81 year old male with past medical history of coronary artery disease status post coronary artery bypass and graft, severe aortic stenosis, cardiomyopathy, stage IV renal failure, permanent atrial fibrillation for evaluation of syncope. Last echocardiogram March 2016 was technically difficult. Ejection fraction was felt to be 25-30%. The calculated valve area was 0.7 cm. There was mild aortic insufficiency, mild mitral regurgitation and severe left atrial enlargement. Patient has limited mobility because of knee pain. He has dyspnea on exertion, orthopnea and chronic mild pedal edema. He denies chest pain or palpitations. Last evening he went to the bathroom and urinated. He then flushed the commode, turned and had a syncopal episode. He struck his left temple area. He was unconscious for approximately 4 minutes. There was no preceding palpitations, chest pain, dyspnea or nausea. Cardiology now asked to evaluate.  Medications Prior to Admission  Medication Sig Dispense Refill  . allopurinol (ZYLOPRIM) 100 MG tablet Take 100 mg by mouth. Every other day    . carvedilol (COREG) 6.25 MG tablet Take 1 tablet (6.25 mg total) by mouth 2 (two) times daily. 180 tablet 3  . Cholecalciferol (VITAMIN D-3) 5000 UNITS TABS Take 1 tablet by mouth daily.     . CRESTOR 10 MG tablet TAKE 1 TABLET BY MOUTH EVERY EVENING 30 tablet 6  . doxazosin (CARDURA) 4 MG tablet Take 1 tablet (4 mg total) by mouth daily. 90 tablet 3  . famotidine (PEPCID) 10 MG tablet Take 10 mg by mouth every morning. For acid    . Ferrous Gluconate (IRON) 240 (27 FE) MG TABS Take 1 tablet by mouth daily.    . fish oil-omega-3 fatty acids 1000 MG capsule Take 1 g by mouth daily.     . hydrALAZINE (APRESOLINE) 10 MG tablet TAKE 1 AND 1/2 TABLETS BY MOUTH 3 TIMES DAILY 135 tablet 5  . hydroxychloroquine (PLAQUENIL) 200 MG tablet Take 200 mg by mouth daily.     . isosorbide mononitrate (IMDUR) 30 MG 24 hr  tablet TAKE 1 TABLET BY MOUTH ONCE DAILY 30 tablet 6  . metolazone (ZAROXOLYN) 2.5 MG tablet 1 tablet today and then only as directed (Patient taking differently: Take 2.5 mg by mouth daily as needed (for fluid). ) 30 tablet 0  . potassium chloride SA (K-DUR,KLOR-CON) 20 MEQ tablet TAKE ONE TABLET BY MOUTH TWICE DAILY 180 tablet 3  . torsemide (DEMADEX) 20 MG tablet TAKE 2 TABLETS BY MOUTH TWICE DAILY 120 tablet 6  . warfarin (COUMADIN) 2.5 MG tablet Take 1 tablet (2.5 mg total) by mouth daily. (Patient taking differently: Take 1.25-2.5 mg by mouth at bedtime. Take 1/2 tablet on Monday and Friday then take 1 tablet the rest of the week) 90 tablet 1  . levalbuterol (XOPENEX) 0.63 MG/3ML nebulizer solution Take 3 mLs (0.63 mg total) by nebulization every 4 (four) hours as needed for wheezing. (Patient not taking: Reported on 12/09/2014) 3 mL 12    No Known Allergies  Past Medical History  Diagnosis Date  . Coronary artery disease     a.  s/p CABG 1990;  b. NSTEMI 9/10:  LHC with S-RCA ok, S-D1 occluded (chronic), S-D3 occluded, L-LAD ok;  PCI:  Endeavour DES to S-D3;  c. 10/2011 Lexiscan MV EF 37%, medium sized fixed defect over basilar to mid lateral wall.  No ishcemia.   . Lung disease, restrictive   . History of PFTs 01/20/2009  . Pleural effusion   . Hypertension   . Dyslipidemia   .  Aortic stenosis     a. echo 4/13: mild AS with mean 19 mmHg. b. echo 12/2012: Mild LVH, EF 25-30%, diffuse HK, moderate to severe aortic stenosis (mean gradient 16 mm of mercury), moderate MR, moderate LAE, mild RVE, mild reduced RVSF, moderate RAE, PASP 40  . Anemia   . Difficult intubation   . Arthritis   . Epistaxis     a. 11/2012: adm with severe epistaxis requiring mechanical ventilation for airway protection c/b blood loss anemia  . Mitral regurgitation     a. Mod by echo 12/2012.  . CKD (chronic kidney disease), stage IV     Dr. Lowell Guitar  . Permanent atrial fibrillation     a. Chronic coumadin  . Sleep  apnea   . Chronic systolic CHF (congestive heart failure)     a. Previous EF 40-50%;  b. Echo 08/2011: mild LVH, EF 55-65, grade 3 diast dysfxn. c. EF 25-30% by echo 12/2012.    Past Surgical History  Procedure Laterality Date  . Coronary artery bypass graft  1999  . Coronary angioplasty with stent placement  2010    DES to the SVG-D3 (100%->0%). LAD 100%, CFX 90%, RCA 100%; LIMA-LAD OK, SVG-D1 100%, SVG-RCA OK  . Back surgery    . Hernia repair    . Dccv    . Cataract extraction      bilateral  . Radiology with anesthesia N/A 12/13/2012    Procedure: RADIOLOGY WITH ANESTHESIA;  Surgeon: Oneal Grout, MD;  Location: [facility] OR;  Service: Radiology;  Laterality: N/A;    History   Social History  . Marital Status: Married    Spouse Name: N/A  . Number of Children: N/A  . Years of Education: N/A   Occupational History  . Retired    Social History Main Topics  . Smoking status: Never Smoker   . Smokeless tobacco: Never Used  . Alcohol Use: No  . Drug Use: No  . Sexual Activity: Not Currently   Other Topics Concern  . Not on file   Social History Narrative    Family History  Problem Relation Age of Onset  . Heart failure Mother   . Heart attack Father   . Hypertension Father     ROS:  Bilateral knee arthralgias but no fevers or chills, productive cough, hemoptysis, dysphasia, odynophagia, melena, hematochezia, dysuria, hematuria, rash, seizure activity, claudication. Remaining systems are negative.  Physical Exam:   Blood pressure 138/84, pulse 82, temperature 97.9 F (36.6 C), temperature source Oral, resp. rate 22, height 5\' 6"  (1.676 m), weight 219 lb 6.4 oz (99.519 kg), SpO2 98 %.  General:  Well developed/well nourished in NAD Skin warm/dry Patient not depressed No peripheral clubbing Back-normal HEENT-large hematoma over left eye. Neck supple/normal carotid upstroke bilaterally; no bruits; no JVD; no thyromegaly chest - minimal basilar crackles. CV -  irregular; no rubs or gallops;  PMI nondisplaced, 3/6 systolic murmur left sternal border. S2 is diminished. Abdomen -NT/ND, no HSM, no mass, + bowel sounds, no bruit 2+ femoral pulses, no bruits Ext-1+ edema, no chords, 2+ DP Neuro-grossly nonfocal  ECG atrial fibrillation, nonspecific ST changes.  Results for orders placed or performed during the hospital encounter of 12/09/14 (from the past 48 hour(s))  CBG monitoring, ED     Status: Abnormal   Collection Time: 12/09/14 12:39 AM  Result Value Ref Range   Glucose-Capillary 124 (H) 65 - 99 mg/dL  Basic metabolic panel     Status: Abnormal  Collection Time: 12/09/14 12:58 AM  Result Value Ref Range   Sodium 141 135 - 145 mmol/L   Potassium 4.1 3.5 - 5.1 mmol/L   Chloride 98 (L) 101 - 111 mmol/L   CO2 28 22 - 32 mmol/L   Glucose, Bld 121 (H) 65 - 99 mg/dL   BUN 79 (H) 6 - 20 mg/dL   Creatinine, Ser 3.67 (H) 0.61 - 1.24 mg/dL   Calcium 9.1 8.9 - 10.3 mg/dL   GFR calc non Af Amer 14 (L) >60 mL/min   GFR calc Af Amer 16 (L) >60 mL/min    Comment: (NOTE) The eGFR has been calculated using the CKD EPI equation. This calculation has not been validated in all clinical situations. eGFR's persistently <60 mL/min signify possible Chronic Kidney Disease.    Anion gap 15 5 - 15  CBC     Status: Abnormal   Collection Time: 12/09/14 12:58 AM  Result Value Ref Range   WBC 8.3 4.0 - 10.5 K/uL   RBC 3.62 (L) 4.22 - 5.81 MIL/uL   Hemoglobin 11.2 (L) 13.0 - 17.0 g/dL   HCT 35.5 (L) 39.0 - 52.0 %   MCV 98.1 78.0 - 100.0 fL   MCH 30.9 26.0 - 34.0 pg   MCHC 31.5 30.0 - 36.0 g/dL   RDW 15.5 11.5 - 15.5 %   Platelets 120 (L) 150 - 400 K/uL  Protime-INR     Status: Abnormal   Collection Time: 12/09/14 12:58 AM  Result Value Ref Range   Prothrombin Time 23.1 (H) 11.6 - 15.2 seconds   INR 2.07 (H) 0.00 - 1.49  I-stat troponin, ED     Status: Abnormal   Collection Time: 12/09/14  2:53 AM  Result Value Ref Range   Troponin i, poc 0.10 (HH)  0.00 - 0.08 ng/mL   Comment NOTIFIED PHYSICIAN    Comment 3            Comment: Due to the release kinetics of cTnI, a negative result within the first hours of the onset of symptoms does not rule out myocardial infarction with certainty. If myocardial infarction is still suspected, repeat the test at appropriate intervals.   Urinalysis, Routine w reflex microscopic (not at [facility])     Status: None   Collection Time: 12/09/14  3:10 AM  Result Value Ref Range   Color, Urine YELLOW YELLOW   APPearance CLEAR CLEAR   Specific Gravity, Urine 1.008 1.005 - 1.030   pH 6.5 5.0 - 8.0   Glucose, UA NEGATIVE NEGATIVE mg/dL   Hgb urine dipstick NEGATIVE NEGATIVE   Bilirubin Urine NEGATIVE NEGATIVE   Ketones, ur NEGATIVE NEGATIVE mg/dL   Protein, ur NEGATIVE NEGATIVE mg/dL   Urobilinogen, UA 0.2 0.0 - 1.0 mg/dL   Nitrite NEGATIVE NEGATIVE   Leukocytes, UA NEGATIVE NEGATIVE    Comment: MICROSCOPIC NOT DONE ON URINES WITH NEGATIVE PROTEIN, BLOOD, LEUKOCYTES, NITRITE, OR GLUCOSE <1000 mg/dL.  Troponin I (q 6hr x 3)     Status: Abnormal   Collection Time: 12/09/14  6:22 AM  Result Value Ref Range   Troponin I 0.11 (H) <0.031 ng/mL    Comment:        PERSISTENTLY INCREASED TROPONIN VALUES IN THE RANGE OF 0.04-0.49 ng/mL CAN BE SEEN IN:       -UNSTABLE ANGINA       -CONGESTIVE HEART FAILURE       -MYOCARDITIS       -CHEST TRAUMA       -  ARRYHTHMIAS       -LATE PRESENTING MYOCARDIAL INFARCTION       -COPD   CLINICAL FOLLOW-UP RECOMMENDED.   Comprehensive metabolic panel     Status: Abnormal   Collection Time: 12/09/14  6:22 AM  Result Value Ref Range   Sodium 140 135 - 145 mmol/L   Potassium 3.7 3.5 - 5.1 mmol/L   Chloride 100 (L) 101 - 111 mmol/L   CO2 28 22 - 32 mmol/L   Glucose, Bld 109 (H) 65 - 99 mg/dL   BUN 75 (H) 6 - 20 mg/dL   Creatinine, Ser 3.68 (H) 0.61 - 1.24 mg/dL   Calcium 9.0 8.9 - 10.3 mg/dL   Total Protein 5.9 (L) 6.5 - 8.1 g/dL   Albumin 3.3 (L) 3.5 - 5.0 g/dL   AST  22 15 - 41 U/L   ALT 17 17 - 63 U/L   Alkaline Phosphatase 46 38 - 126 U/L   Total Bilirubin 1.1 0.3 - 1.2 mg/dL   GFR calc non Af Amer 14 (L) >60 mL/min   GFR calc Af Amer 16 (L) >60 mL/min    Comment: (NOTE) The eGFR has been calculated using the CKD EPI equation. This calculation has not been validated in all clinical situations. eGFR's persistently <60 mL/min signify possible Chronic Kidney Disease.    Anion gap 12 5 - 15  CBC with Differential/Platelet     Status: Abnormal (Preliminary result)   Collection Time: 12/09/14  6:22 AM  Result Value Ref Range   WBC 9.2 4.0 - 10.5 K/uL   RBC 3.38 (L) 4.22 - 5.81 MIL/uL   Hemoglobin 10.7 (L) 13.0 - 17.0 g/dL   HCT 33.5 (L) 39.0 - 52.0 %   MCV 99.1 78.0 - 100.0 fL   MCH 31.7 26.0 - 34.0 pg   MCHC 31.9 30.0 - 36.0 g/dL   RDW 15.6 (H) 11.5 - 15.5 %   Platelets PENDING 150 - 400 K/uL   Neutrophils Relative % PENDING 43 - 77 %   Neutro Abs PENDING 1.7 - 7.7 K/uL   Band Neutrophils PENDING 0 - 10 %   Lymphocytes Relative PENDING 12 - 46 %   Lymphs Abs PENDING 0.7 - 4.0 K/uL   Monocytes Relative PENDING 3 - 12 %   Monocytes Absolute PENDING 0.1 - 1.0 K/uL   Eosinophils Relative PENDING 0 - 5 %   Eosinophils Absolute PENDING 0.0 - 0.7 K/uL   Basophils Relative PENDING 0 - 1 %   Basophils Absolute PENDING 0.0 - 0.1 K/uL   WBC Morphology PENDING    RBC Morphology PENDING    Smear Review PENDING    nRBC PENDING 0 /100 WBC   Metamyelocytes Relative PENDING %   Myelocytes PENDING %   Promyelocytes Absolute PENDING %   Blasts PENDING %  TSH     Status: None   Collection Time: 12/09/14  6:22 AM  Result Value Ref Range   TSH 3.578 0.350 - 4.500 uIU/mL    Dg Chest 2 View  12/09/2014   CLINICAL DATA:  Syncope. Hit face in bathroom. Concern for chest injury. Initial encounter.  EXAM: CHEST  2 VIEW  COMPARISON:  Chest radiograph performed 05/12/2013  FINDINGS: Mild vascular congestion is noted. Patchy bilateral central airspace opacities  may reflect mild interstitial edema. No pleural effusion or pneumothorax is seen.  The cardiomediastinal silhouette is mildly enlarged. The patient is status post median sternotomy. No acute osseous abnormalities are identified. Degenerative change is noted at  both glenohumeral joints.  IMPRESSION: Mild vascular congestion and mild cardiomegaly noted. Patchy bilateral central airspace opacities may reflect mild interstitial edema.   Electronically Signed   By: Garald Balding M.D.   On: 12/09/2014 05:16   Ct Head Wo Contrast  12/09/2014   CLINICAL DATA:  Initial evaluation for acute trauma, fall.  EXAM: CT HEAD WITHOUT CONTRAST  TECHNIQUE: Contiguous axial images were obtained from the base of the skull through the vertex without intravenous contrast.  COMPARISON:  None.  FINDINGS: Diffuse prominence of the CSF containing spaces is compatible with generalized cerebral atrophy. Patchy hypodensity within the periventricular and deep white matter both cerebral hemispheres present, most consistent with chronic small vessel ischemic disease. Prominent atheromatous plaque present within the carotid siphons and vertebrobasilar system.  No acute large vessel territory infarct or intracranial hemorrhage. No mass lesion, midline shift, or mass effect. No hydrocephalus. No extra-axial fluid collection para  Prominent left periorbital contusion present. Globes are intact. Sequelae of prior lens extraction noted bilaterally.  Calvarium intact.  Mild layering opacity present within the left sphenoid sinus. Paranasal sinuses are otherwise clear. No mastoid effusion.  IMPRESSION: 1. No acute intracranial process. 2. Left periorbital soft tissue contusion. 3. Generalized cerebral atrophy with chronic microvascular ischemic disease.   Electronically Signed   By: Jeannine Boga M.D.   On: 12/09/2014 02:56    Assessment/Plan 1 syncope-based on history and symptoms sound to be micturition syncope. However he has multiple  other potential cardiac etiologies. He has permanent atrial fibrillation and could have bradycardia contributing. He has a cardiomyopathy increasing risk of ventricular arrhythmia. He also has severe aortic stenosis on examination. Long discussion with patient and family today. Given his age and baseline renal failure I doubt he would be a good candidate for aortic valve replacement. TAVR could be considered but workup including catheterization and CTAs would likely result in dialysis. Patient does not wish to pursue any further evaluation at this point. He is not clear he would ever be agreeable to dialysis. We will plan to repeat his echocardiogram. Follow on telemetry for 24 hours to exclude pauses contributing to his syncope. If negative he potentially could be discharged tomorrow morning and follow-up with Dr. Acie Fredrickson for further discussions. Note he is clear that he is a no CODE BLUE including no defibrillation, CPR or intubation. 2 aortic stenosis-repeat echocardiogram. Plan as outlined above. 3 permanent atrial fibrillation-Chadsvasc 5; continue Coumadin for now. If he has any further falls or syncopal episodes we would need to discontinue. Continue carvedilol for rate control. 4 cardiomyopathy-continue beta blocker and hydralazine/nitrates. No ACE inhibitor or ARB given severity of renal failure. 5 chronic stage IV renal failure-followed by nephrology. 6 chronic systolic congestive heart failure-continue present dose of diabetics. 7 coronary artery disease status post coronary artery bypass graft-continue statin. No aspirin given need for Coumadin. 8 mildly elevated troponin-occurring in the setting of chronic renal failure with no clear trend up and no chest pain. Would not pursue further evaluation.  Kirk Ruths MD 12/09/2014, 8:08 AM

## 2014-12-09 NOTE — Progress Notes (Signed)
Received report on Mr. Alex Orozco.  Pt denies pain.  Gauze dressing intact to left side of face - underneath is vaseline gauze to skin tear.  Left eye and left side of face with dark purple bruising from fall at home.  Ecchymosis present to both arms.  Skin warm and dry.  Family at bedside.  Bed alarm off per family request since they are at bedside - they confirmed that they will inform staff when they need to leave room so alarm can be turned on when by himself.  Pt alert and oriented x 4.

## 2014-12-09 NOTE — Evaluation (Signed)
Physical Therapy Evaluation Patient Details Name: Alex Orozco MRN: [id number] DOB: January 14, 1930 Today's Date: 12/09/2014   History of Present Illness  Alex Orozco is a 81 y.o. male with history of CAD status post CABG status post stenting, chronic atrial fibrillation, chronic kidney disease stage IV, chronic systolic heart failure and severe aortic stenosis was brought to the ER after patient had a fall and syncope. Patient was in the bathroom and returning back patient suddenly lost consciousness and hit his head against the bathroom tub. Patient has a small laceration and periorbital hematoma around the left eye.   Clinical Impression  Pt admitted with above diagnosis. Pt currently with functional limitations due to the deficits listed below (see PT Problem List). Pt will benefit from skilled PT to increase their independence and safety with mobility to allow discharge to the venue listed below.  Pt close to baseline level, but recommend using RW when he goes home even in the house until soreness decreases and pt in agreement.  He has all DME and at this point does not need any PT follow up.    Follow Up Recommendations      Equipment Recommendations  None recommended by PT    Recommendations for Other Services       Precautions / Restrictions Precautions Precautions: Fall Restrictions Weight Bearing Restrictions: No      Mobility  Bed Mobility Overal bed mobility: Needs Assistance Bed Mobility: Supine to Sit     Supine to sit: Min assist     General bed mobility comments: Daughter assisted with HHA to get trunk upright  Transfers Overall transfer level: Needs assistance Equipment used: Rolling walker (2 wheeled) Transfers: Sit to/from Stand Sit to Stand: Min guard         General transfer comment: MIN/guard for steadying.  Tends to rely on back of legs against bed for balance.  Ambulation/Gait Ambulation/Gait assistance: Min guard Ambulation Distance (Feet): 85  Feet Assistive device: Rolling walker (2 wheeled) Gait Pattern/deviations: Trunk flexed;Wide base of support;Antalgic;Step-through pattern Gait velocity: WNL   General Gait Details: AMbulated in hallway with RW with flexed posture which pt and daughter report all pre-morbid.  Amb in room without RW and demonstrates lateral sway with decreased balance but no LOB.  Famiy reports this is premorbid as well. 2/4 DOE with o2 99% on RA.  Stairs            Wheelchair Mobility    Modified Rankin (Stroke Patients Only)       Balance Overall balance assessment: Needs assistance Sitting-balance support: Feet supported;No upper extremity supported Sitting balance-Leahy Scale: Good     Standing balance support: No upper extremity supported;During functional activity Standing balance-Leahy Scale: Fair Standing balance comment: Pt able to stand and use toilet with MIN/guard                             Pertinent Vitals/Pain Pain Assessment:  ("soreness" from fall)    Home Living Family/patient expects to be discharged to:: Private residence Living Arrangements: Spouse/significant other Available Help at Discharge: Family;Available 24 hours/day (children live next door) Type of Home: House Home Access: Stairs to enter (getting ramp in next few weeks) Entrance Stairs-Rails: Right Entrance Stairs-Number of Steps: 2 Home Layout: One level Home Equipment: Walker - 2 wheels;Cane - single point;Bedside commode;Grab bars - tub/shower;Hand held shower head Additional Comments: elevated toilet, lift chair    Prior Function Level of Independence: Independent  with assistive device(s)         Comments: Uses cane with steps and RW for longer community distances     Hand Dominance   Dominant Hand: Right    Extremity/Trunk Assessment   Upper Extremity Assessment: LUE deficits/detail;Generalized weakness;Overall WFL for tasks assessed       LUE Deficits / Details:  Decreased shoulder ROM   Lower Extremity Assessment: Generalized weakness;Overall [facility] for tasks assessed      Cervical / Trunk Assessment: Kyphotic  Communication   Communication: HOH  Cognition Arousal/Alertness: Awake/alert Behavior During Therapy: WFL for tasks assessed/performed Overall Cognitive Status: Within Functional Limits for tasks assessed                      General Comments General comments (skin integrity, edema, etc.): Family is very supportive.  Discussed using RW when he gets home and pt/family verbalized understanding.  Due to being close to baseline, deferred HHPT and they are in agreement.    Exercises General Exercises - Lower Extremity Long Arc Quad: AROM;Both;5 reps;Seated Hip Flexion/Marching: AROM;Both;5 reps;Seated      Assessment/Plan    PT Assessment Patient needs continued PT services  PT Diagnosis Difficulty walking;Generalized weakness   PT Problem List Decreased strength;Decreased activity tolerance;Decreased balance;Decreased mobility  PT Treatment Interventions Gait training;Functional mobility training;Therapeutic activities;Therapeutic exercise;Balance training   PT Goals (Current goals can be found in the Care Plan section) Acute Rehab PT Goals Patient Stated Goal: Go home PT Goal Formulation: With patient/family Time For Goal Achievement: 12/16/14 Potential to Achieve Goals: Good    Frequency Min 3X/week   Barriers to discharge        Co-evaluation               End of Session Equipment Utilized During Treatment: Gait belt Activity Tolerance: Patient tolerated treatment well Patient left: in chair;with call bell/phone within reach;with family/visitor present;with nursing/sitter in room Nurse Communication: Mobility status    Functional Assessment Tool Used: clinical judgement Functional Limitation: Mobility: Walking and moving around Mobility: Walking and Moving Around Current Status [phone](G8978): At least 1  percent but less than 20 percent impaired, limited or restricted Mobility: Walking and Moving Around Goal Status [phone](G8979): 0 percent impaired, limited or restricted    Time: [id number] PT Time Calculation (min) (ACUTE ONLY): 32 min   Charges:   PT Evaluation $Initial PT Evaluation Tier I: 1 Procedure PT Treatments $Gait Training: 8-22 mins   PT G Codes:   PT G-Codes **NOT FOR INPATIENT CLASS** Functional Assessment Tool Used: clinical judgement Functional Limitation: Mobility: Walking and moving around Mobility: Walking and Moving Around Current Status ([id number](G8978): At least 1 percent but less than 20 percent impaired, limited or restricted Mobility: Walking and Moving Around Goal Status [phone](G8979): 0 percent impaired, limited or restricted    Rahman Ferrall LUBECK 12/09/2014, 10:36 AM

## 2014-12-09 NOTE — ED Provider Notes (Signed)
CSN: [id number]   Arrival date & time 12/09/14 0025  History  This chart was scribed for  Mirian Mo, MD by Bethel Born, ED Scribe. This patient was seen in room D34C/D34C and the patient's care was started at 12:44 AM.  Chief Complaint  Patient presents with  . Loss of Consciousness    HPI The history is provided by the patient and the spouse. No language interpreter was used.   Alex Orozco is a 81 y.o. male with PMHx of CAD s/p CABG, a-fib on coumadin, CHF, and CKD who presents to the Emergency Department complaining of a syncopal episode just PTA. The pt was in the bathroom when he turned to flush the toilet after urinating and lost consciousness. Associated symptoms include bleeding from the mouth and a hematoma at the left eye.  Prior to the episode he had no weakness, dizziness, numbness, nausea, or diarrhea. Pt notes subjective fever and cough for 1 week. Pt denies vision change, new back pain, neck pain, or any other injury.   Past Medical History  Diagnosis Date  . Coronary artery disease     a.  s/p CABG 1990;  b. NSTEMI 9/10:  LHC with S-RCA ok, S-D1 occluded (chronic), S-D3 occluded, L-LAD ok;  PCI:  Endeavour DES to S-D3;  c. 10/2011 Lexiscan MV EF 37%, medium sized fixed defect over basilar to mid lateral wall.  No ishcemia.   . Lung disease, restrictive   . History of PFTs 01/20/2009  . Pleural effusion   . Hypertension   . Dyslipidemia   . Aortic stenosis     a. echo 4/13: mild AS with mean 19 mmHg. b. echo 12/2012: Mild LVH, EF 25-30%, diffuse HK, moderate to severe aortic stenosis (mean gradient 16 mm of mercury), moderate MR, moderate LAE, mild RVE, mild reduced RVSF, moderate RAE, PASP 40  . Anemia   . Difficult intubation   . Arthritis   . Epistaxis     a. 11/2012: adm with severe epistaxis requiring mechanical ventilation for airway protection c/b blood loss anemia  . Mitral regurgitation     a. Mod by echo 12/2012.  . CKD (chronic kidney disease), stage IV      Dr. Lowell Guitar  . Permanent atrial fibrillation     a. Chronic coumadin  . Sleep apnea   . Chronic systolic CHF (congestive heart failure)     a. Previous EF 40-50%;  b. Echo 08/2011: mild LVH, EF 55-65, grade 3 diast dysfxn. c. EF 25-30% by echo 12/2012.    Past Surgical History  Procedure Laterality Date  . Coronary artery bypass graft  1999  . Coronary angioplasty with stent placement  2010    DES to the SVG-D3 (100%->0%). LAD 100%, CFX 90%, RCA 100%; LIMA-LAD OK, SVG-D1 100%, SVG-RCA OK  . Back surgery    . Hernia repair    . Dccv    . Cataract extraction      bilateral  . Radiology with anesthesia N/A 12/13/2012    Procedure: RADIOLOGY WITH ANESTHESIA;  Surgeon: Oneal Grout, MD;  Location: [facility] OR;  Service: Radiology;  Laterality: N/A;    Family History  Problem Relation Age of Onset  . Heart failure Mother   . Heart attack Father   . Hypertension Father     History  Substance Use Topics  . Smoking status: Never Smoker   . Smokeless tobacco: Never Used  . Alcohol Use: No     Review of Systems  Constitutional:  Positive for fever.  HENT:       Left eye hematoma Bleeding from the mouth  Eyes: Negative for blurred vision and double vision.  Respiratory: Positive for cough. Negative for shortness of breath.   Cardiovascular: Negative for chest pain.  Gastrointestinal: Negative for nausea, vomiting and diarrhea.  Musculoskeletal: Negative for back pain, joint pain and neck pain.  Neurological: Positive for loss of consciousness. Negative for dizziness and weakness.  All other systems reviewed and are negative.   Home Medications   Prior to Admission medications   Medication Sig Start Date End Date Taking? Authorizing Provider  allopurinol (ZYLOPRIM) 100 MG tablet Take 100 mg by mouth. Every other day 02/03/14  Yes Historical Provider, MD  carvedilol (COREG) 6.25 MG tablet Take 1 tablet (6.25 mg total) by mouth 2 (two) times daily. 04/01/14  Yes Vesta Mixer,  MD  Cholecalciferol (VITAMIN D-3) 5000 UNITS TABS Take 1 tablet by mouth daily.    Yes Historical Provider, MD  CRESTOR 10 MG tablet TAKE 1 TABLET BY MOUTH EVERY EVENING 07/01/14  Yes Vesta Mixer, MD  doxazosin (CARDURA) 4 MG tablet Take 1 tablet (4 mg total) by mouth daily. 03/04/14  Yes Vesta Mixer, MD  famotidine (PEPCID) 10 MG tablet Take 10 mg by mouth every morning. For acid   Yes Historical Provider, MD  Ferrous Gluconate (IRON) 240 (27 FE) MG TABS Take 1 tablet by mouth daily.   Yes Historical Provider, MD  fish oil-omega-3 fatty acids 1000 MG capsule Take 1 g by mouth daily.    Yes Historical Provider, MD  hydrALAZINE (APRESOLINE) 10 MG tablet TAKE 1 AND 1/2 TABLETS BY MOUTH 3 TIMES DAILY 08/24/14  Yes Vesta Mixer, MD  hydroxychloroquine (PLAQUENIL) 200 MG tablet Take 200 mg by mouth daily.    Yes Historical Provider, MD  isosorbide mononitrate (IMDUR) 30 MG 24 hr tablet TAKE 1 TABLET BY MOUTH ONCE DAILY 07/01/14  Yes Vesta Mixer, MD  metolazone (ZAROXOLYN) 2.5 MG tablet 1 tablet today and then only as directed Patient taking differently: Take 2.5 mg by mouth daily as needed (for fluid).  05/28/13  Yes Dyann Kief, PA-C  potassium chloride SA (K-DUR,KLOR-CON) 20 MEQ tablet TAKE ONE TABLET BY MOUTH TWICE DAILY 11/18/14  Yes Vesta Mixer, MD  torsemide (DEMADEX) 20 MG tablet TAKE 2 TABLETS BY MOUTH TWICE DAILY 09/22/14  Yes Vesta Mixer, MD  warfarin (COUMADIN) 2.5 MG tablet Take 1 tablet (2.5 mg total) by mouth daily. Patient taking differently: Take 1.25-2.5 mg by mouth at bedtime. Take 1/2 tablet on Monday and Friday then take 1 tablet the rest of the week 08/04/14  Yes Vesta Mixer, MD  levalbuterol Pauline Aus) 0.63 MG/3ML nebulizer solution Take 3 mLs (0.63 mg total) by nebulization every 4 (four) hours as needed for wheezing. Patient not taking: Reported on 12/09/2014 12/20/12   Jeanella Craze, NP    Allergies  Review of patient's allergies indicates no known  allergies.  Triage Vitals: BP 111/75 mmHg  Pulse 73  Temp(Src) 97.6 F (36.4 C) (Oral)  Resp 23  SpO2 100%  Physical Exam  Constitutional: He is oriented to person, place, and time. He appears well-developed and well-nourished.  HENT:  Head: Normocephalic and atraumatic.  Eyes: Conjunctivae and EOM are normal.  Neck: Normal range of motion. Neck supple.  Cardiovascular: Normal rate, regular rhythm and normal heart sounds.   Pulmonary/Chest: Effort normal and breath sounds normal. No respiratory distress.  Abdominal:  He exhibits no distension. There is no tenderness. There is no rebound and no guarding.  Musculoskeletal: Normal range of motion.  Neurological: He is alert and oriented to person, place, and time.  Skin: Skin is warm and dry.  Vitals reviewed.   ED Course  Procedures   DIAGNOSTIC STUDIES: Oxygen Saturation is 100% on RA, normal by my interpretation.    COORDINATION OF CARE: 12:49 AM Discussed treatment plan which includes lab work, EKG, and CT head without contrast with pt at bedside and pt agreed to plan.  Labs Review-  Labs Reviewed  BASIC METABOLIC PANEL - Abnormal; Notable for the following:    Chloride 98 (*)    Glucose, Bld 121 (*)    BUN 79 (*)    Creatinine, Ser 3.67 (*)    GFR calc non Af Amer 14 (*)    GFR calc Af Amer 16 (*)    All other components within normal limits  CBC - Abnormal; Notable for the following:    RBC 3.62 (*)    Hemoglobin 11.2 (*)    HCT 35.5 (*)    Platelets 120 (*)    All other components within normal limits  PROTIME-INR - Abnormal; Notable for the following:    Prothrombin Time 23.1 (*)    INR 2.07 (*)    All other components within normal limits  TROPONIN I - Abnormal; Notable for the following:    Troponin I 0.11 (*)    All other components within normal limits  TROPONIN I - Abnormal; Notable for the following:    Troponin I 0.09 (*)    All other components within normal limits  TROPONIN I - Abnormal;  Notable for the following:    Troponin I 0.11 (*)    All other components within normal limits  COMPREHENSIVE METABOLIC PANEL - Abnormal; Notable for the following:    Chloride 100 (*)    Glucose, Bld 109 (*)    BUN 75 (*)    Creatinine, Ser 3.68 (*)    Total Protein 5.9 (*)    Albumin 3.3 (*)    GFR calc non Af Amer 14 (*)    GFR calc Af Amer 16 (*)    All other components within normal limits  CBC WITH DIFFERENTIAL/PLATELET - Abnormal; Notable for the following:    RBC 3.38 (*)    Hemoglobin 10.7 (*)    HCT 33.5 (*)    RDW 15.6 (*)    Platelets 110 (*)    Neutrophils Relative % 83 (*)    Lymphocytes Relative 6 (*)    Lymphs Abs 0.6 (*)    All other components within normal limits  BRAIN NATRIURETIC PEPTIDE - Abnormal; Notable for the following:    B Natriuretic Peptide 1103.0 (*)    All other components within normal limits  CBG MONITORING, ED - Abnormal; Notable for the following:    Glucose-Capillary 124 (*)    All other components within normal limits  I-STAT TROPOININ, ED - Abnormal; Notable for the following:    Troponin i, poc 0.10 (*)    All other components within normal limits  URINALYSIS, ROUTINE W REFLEX MICROSCOPIC (NOT AT [facility])  TSH  PROTIME-INR  BASIC METABOLIC PANEL  CBC    Imaging Review Dg Chest 2 View  12/09/2014   CLINICAL DATA:  Syncope. Hit face in bathroom. Concern for chest injury. Initial encounter.  EXAM: CHEST  2 VIEW  COMPARISON:  Chest radiograph performed 05/12/2013  FINDINGS: Mild vascular  congestion is noted. Patchy bilateral central airspace opacities may reflect mild interstitial edema. No pleural effusion or pneumothorax is seen.  The cardiomediastinal silhouette is mildly enlarged. The patient is status post median sternotomy. No acute osseous abnormalities are identified. Degenerative change is noted at both glenohumeral joints.  IMPRESSION: Mild vascular congestion and mild cardiomegaly noted. Patchy bilateral central airspace opacities  may reflect mild interstitial edema.   Electronically Signed   By: Roanna Raider M.D.   On: 12/09/2014 05:16   Ct Head Wo Contrast  12/09/2014   CLINICAL DATA:  Initial evaluation for acute trauma, fall.  EXAM: CT HEAD WITHOUT CONTRAST  TECHNIQUE: Contiguous axial images were obtained from the base of the skull through the vertex without intravenous contrast.  COMPARISON:  None.  FINDINGS: Diffuse prominence of the CSF containing spaces is compatible with generalized cerebral atrophy. Patchy hypodensity within the periventricular and deep white matter both cerebral hemispheres present, most consistent with chronic small vessel ischemic disease. Prominent atheromatous plaque present within the carotid siphons and vertebrobasilar system.  No acute large vessel territory infarct or intracranial hemorrhage. No mass lesion, midline shift, or mass effect. No hydrocephalus. No extra-axial fluid collection para  Prominent left periorbital contusion present. Globes are intact. Sequelae of prior lens extraction noted bilaterally.  Calvarium intact.  Mild layering opacity present within the left sphenoid sinus. Paranasal sinuses are otherwise clear. No mastoid effusion.  IMPRESSION: 1. No acute intracranial process. 2. Left periorbital soft tissue contusion. 3. Generalized cerebral atrophy with chronic microvascular ischemic disease.   Electronically Signed   By: Rise Mu M.D.   On: 12/09/2014 02:56    EKG Interpretation  Date/Time:  Wednesday December 09 2014 00:33:47 EDT Ventricular Rate:  89 PR Interval:    QRS Duration: 94 QT Interval:  382 QTC Calculation: 465 R Axis:   -3 Text Interpretation:  Atrial fibrillation, new since last tracing Ventricular premature complex Repol abnrm suggests ischemia, lateral leads Confirmed by Mirian Mo [phone]) on 12/09/2014 1:00:32 AM       MDM   Final diagnoses:  Syncope, unspecified syncope type     81 y.o. male with pertinent PMH of afib on  coumadin, CAD, CHF, stage 4 ckd presents with drop attack syncope after urinating.  No chest pain, numbness, or other symptoms.  No seizure like activity reported.  Benign exam on arrival.  Pt symptom free.  Trop elevated, but at pt baseline.  Admitted for tele monitoring.    I have reviewed all laboratory and imaging studies if ordered as above  1. Syncope, unspecified syncope type          Mirian Mo, MD 12/09/14 2302

## 2014-12-09 NOTE — Progress Notes (Signed)
ANTICOAGULATION CONSULT NOTE - Follow Up Consult  Pharmacy Consult for Warfarin Indication: atrial fibrillation  No Known Allergies  Patient Measurements: Height: 5\' 6"  (167.6 cm) Weight: 219 lb 6.4 oz (99.519 kg) IBW/kg (Calculated) : 63.8   Vital Signs: Temp: 97.9 F (36.6 C) (07/20 0459) Temp Source: Oral (07/20 0459) BP: 131/79 mmHg (07/20 1013) Pulse Rate: 82 (07/20 0459)  Labs:  Recent Labs  12/09/14 0058 12/09/14 0622  HGB 11.2* 10.7*  HCT 35.5* 33.5*  PLT 120* 110*  LABPROT 23.1*  --   INR 2.07*  --   CREATININE 3.67* 3.68*  TROPONINI  --  0.11*    Estimated Creatinine Clearance: 17.1 mL/min (by C-G formula based on Cr of 3.68).   Medications:  Scheduled:  . allopurinol  100 mg Oral Daily  . carvedilol  6.25 mg Oral BID WC  . cholecalciferol  5,000 Units Oral Daily  . doxazosin  4 mg Oral Daily  . famotidine  10 mg Oral q morning - 10a  . ferrous sulfate  325 mg Oral Daily  . hydrALAZINE  15 mg Oral 3 times per day  . hydroxychloroquine  200 mg Oral Daily  . isosorbide mononitrate  30 mg Oral Daily  . omega-3 acid ethyl esters  1 g Oral Daily  . potassium chloride SA  20 mEq Oral BID  . rosuvastatin  10 mg Oral QPM  . sodium chloride  3 mL Intravenous Q12H  . torsemide  40 mg Oral BID  . [START ON Jan 15, 2015] warfarin  1.25 mg Oral Once per day on Mon Fri  . warfarin  2.5 mg Oral Once per day on Sun Tue Wed Thu Sat  . Warfarin - Pharmacist Dosing Inpatient   Does not apply q1800    Assessment:  81 yo male presents with syncopal episode/fall.  Patient has permanent atrial fibrillation and severe aortic stenosis on exam.  Per cardiology, patient not a candidate for AVR due to renal function. INR 2.07 H&H 10.7/33.5  Goal of Therapy:  INR 2-3 Monitor platelets by anticoagulation protocol: Yes   Plan:  -Continue warfarin per home regime (1.25 mg on Mon/Fri, 2.5 mg all other days) -Daily PT/INR, adjust dosing accordingly -Follow Left  periorbital hematoma in light of antiocoagulation -Monitor for s/sx bleeding   Elige KoLatousha P Aalyssa Elderkin, Pharm.D., BCPS 12/09/2014,10:53 AM

## 2014-12-09 NOTE — ED Notes (Signed)
Per ems-- pt got up from using the restroom and had a syncopal episode. Pt does not remember the episode. Hematoma to L eye and small amount of bleeding from mouth. Pt is on coumadin. Vs stable. Pt a&o x4.

## 2014-12-09 NOTE — Progress Notes (Signed)
ANTICOAGULATION CONSULT NOTE - Follow Up Consult  Pharmacy Consult for Warfarin  Indication: atrial fibrillation  No Known Allergies  Patient Measurements: Height: 5\' 6"  (167.6 cm) Weight: 219 lb 6.4 oz (99.519 kg) IBW/kg (Calculated) : 63.8  Vital Signs: Temp: 97.9 F (36.6 C) (07/20 0459) Temp Source: Oral (07/20 0459) BP: 138/84 mmHg (07/20 0459) Pulse Rate: 82 (07/20 0459)  Labs:  Recent Labs  12/09/14 0058  HGB 11.2*  HCT 35.5*  PLT 120*  LABPROT 23.1*  INR 2.07*  CREATININE 3.67*    Estimated Creatinine Clearance: 17.1 mL/min (by C-G formula based on Cr of 3.67).   Assessment: 81 y/o M here with syncopal episode/fall, warfarin PTA for afib, does have small hematoma to left eye, INR is therapeutic at 2.07, Hgb 11.2, noted renal dysfunction, other labs as above.   Goal of Therapy:  INR 2-3 Monitor platelets by anticoagulation protocol: Yes   Plan:  -Continue warfarin per home regime (1.25 mg on Mon/Fri, 2.5 mg all other days) -Daily PT/INR, adjust dose as needed -Monitor for bleeding, f/u hematoma to ensure stable  Abran DukeLedford, Despina Boan 12/09/2014,5:52 AM

## 2014-12-09 NOTE — ED Notes (Signed)
Pt transported to radiology.

## 2014-12-10 ENCOUNTER — Telehealth: Payer: Self-pay | Admitting: Cardiovascular Disease

## 2014-12-10 ENCOUNTER — Ambulatory Visit (HOSPITAL_BASED_OUTPATIENT_CLINIC_OR_DEPARTMENT_OTHER): Payer: Medicare Other

## 2014-12-10 DIAGNOSIS — R55 Syncope and collapse: Secondary | ICD-10-CM | POA: Insufficient documentation

## 2014-12-10 DIAGNOSIS — I10 Essential (primary) hypertension: Secondary | ICD-10-CM | POA: Diagnosis not present

## 2014-12-10 DIAGNOSIS — I350 Nonrheumatic aortic (valve) stenosis: Secondary | ICD-10-CM | POA: Insufficient documentation

## 2014-12-10 DIAGNOSIS — I482 Chronic atrial fibrillation: Secondary | ICD-10-CM | POA: Diagnosis not present

## 2014-12-10 DIAGNOSIS — N184 Chronic kidney disease, stage 4 (severe): Secondary | ICD-10-CM | POA: Diagnosis not present

## 2014-12-10 DIAGNOSIS — I359 Nonrheumatic aortic valve disorder, unspecified: Secondary | ICD-10-CM | POA: Diagnosis not present

## 2014-12-10 DIAGNOSIS — I5022 Chronic systolic (congestive) heart failure: Secondary | ICD-10-CM | POA: Diagnosis not present

## 2014-12-10 LAB — BASIC METABOLIC PANEL
Anion gap: 12 (ref 5–15)
BUN: 72 mg/dL — ABNORMAL HIGH (ref 6–20)
CO2: 32 mmol/L (ref 22–32)
Calcium: 9.3 mg/dL (ref 8.9–10.3)
Chloride: 100 mmol/L — ABNORMAL LOW (ref 101–111)
Creatinine, Ser: 3.39 mg/dL — ABNORMAL HIGH (ref 0.61–1.24)
GFR, EST AFRICAN AMERICAN: 18 mL/min — AB (ref >60)
GFR, EST NON AFRICAN AMERICAN: 16 mL/min — AB (ref >60)
Glucose, Bld: 95 mg/dL (ref 65–99)
Potassium: 3.5 mmol/L (ref 3.5–5.1)
Sodium: 144 mmol/L (ref 135–145)

## 2014-12-10 LAB — CBC
HCT: 32.3 % — ABNORMAL LOW (ref 39.0–52.0)
HEMOGLOBIN: 10.2 g/dL — AB (ref 13.0–17.0)
MCH: 30.9 pg (ref 26.0–34.0)
MCHC: 31.6 g/dL (ref 30.0–36.0)
MCV: 97.9 fL (ref 78.0–100.0)
Platelets: 119 10*3/uL — ABNORMAL LOW (ref 150–400)
RBC: 3.3 MIL/uL — AB (ref 4.22–5.81)
RDW: 15.5 % (ref 11.5–15.5)
WBC: 7.1 10*3/uL (ref 4.0–10.5)

## 2014-12-10 LAB — PROTIME-INR
INR: 1.94 — AB (ref 0.00–1.49)
PROTHROMBIN TIME: 22.1 s — AB (ref 11.6–15.2)

## 2014-12-10 MED ORDER — PERFLUTREN LIPID MICROSPHERE
1.0000 mL | INTRAVENOUS | Status: AC | PRN
  Administered 2014-12-10: 2 mL via INTRAVENOUS
  Filled 2014-12-10: qty 10

## 2014-12-10 MED ORDER — CARVEDILOL 6.25 MG PO TABS
6.2500 mg | ORAL_TABLET | Freq: Two times a day (BID) | ORAL | Status: DC
  Administered 2014-12-10: 6.25 mg via ORAL
  Filled 2014-12-10 (×3): qty 1

## 2014-12-10 NOTE — Progress Notes (Signed)
DAILY PROGRESS NOTE  Subjective:  Briefly seen and examined. Echo was in progress - EF looks to be about 20% by my interpretation. There is critical aortic stenosis, especially given low cardiac output. AVA is <0.7 cm2. He has a large left eye periorbital hematoma.   Objective:  Temp:  [97.4 F (36.3 C)-98.3 F (36.8 C)] 97.9 F (36.6 C) (07/21 0447) Pulse Rate:  [62-85] 67 (07/21 0447) Resp:  [18] 18 (07/21 0447) BP: (111-126)/(66-76) 113/66 mmHg (07/21 0952) SpO2:  [98 %-99 %] 98 % (07/21 0447) Weight:  [221 lb 11.2 oz (100.562 kg)] 221 lb 11.2 oz (100.562 kg) (07/21 0447) Weight change: 1 lb 11.2 oz (0.771 kg)  Intake/Output from previous day: 07/20 0701 - 07/21 0700 In: 1135 [P.O.:1135] Out: 1251 [Urine:1251]  Intake/Output from this shift: Total I/O In: 240 [P.O.:240] Out: -   Medications: Current Facility-Administered Medications  Medication Dose Route Frequency Provider Last Rate Last Dose  . acetaminophen (TYLENOL) tablet 650 mg  650 mg Oral Q6H PRN Rise Patience, MD   650 mg at 12/10/14 1914   Or  . acetaminophen (TYLENOL) suppository 650 mg  650 mg Rectal Q6H PRN Rise Patience, MD      . allopurinol (ZYLOPRIM) tablet 100 mg  100 mg Oral Daily Rise Patience, MD   100 mg at 12/10/14 0953  . carvedilol (COREG) tablet 6.25 mg  6.25 mg Oral BID WC Kelvin Cellar, MD   6.25 mg at 12/10/14 0953  . cholecalciferol (VITAMIN D) tablet 5,000 Units  5,000 Units Oral Daily Rise Patience, MD   5,000 Units at 12/10/14 [id number]  . doxazosin (CARDURA) tablet 4 mg  4 mg Oral Daily Rise Patience, MD   4 mg at 12/10/14 5621  . famotidine (PEPCID) tablet 10 mg  10 mg Oral q morning - 10a Rise Patience, MD   10 mg at 12/10/14 3086  . ferrous sulfate tablet 325 mg  325 mg Oral Daily Rise Patience, MD   325 mg at 12/10/14 5784  . hydroxychloroquine (PLAQUENIL) tablet 200 mg  200 mg Oral Daily Rise Patience, MD   200 mg at 12/10/14 0953  .  omega-3 acid ethyl esters (LOVAZA) capsule 1 g  1 g Oral Daily Rise Patience, MD   1 g at 12/10/14 [phone]  . ondansetron (ZOFRAN) tablet 4 mg  4 mg Oral Q6H PRN Rise Patience, MD       Or  . ondansetron [facility]) injection 4 mg  4 mg Intravenous Q6H PRN Rise Patience, MD      . perflutren lipid microspheres (DEFINITY) IV suspension  1-10 mL Intravenous PRN Pixie Casino, MD   2 mL at 12/10/14 1239  . potassium chloride SA (K-DUR,KLOR-CON) CR tablet 20 mEq  20 mEq Oral BID Rise Patience, MD   20 mEq at 12/10/14 0952  . rosuvastatin (CRESTOR) tablet 10 mg  10 mg Oral QPM Rise Patience, MD   10 mg at 12/09/14 1731  . sodium chloride 0.9 % injection 3 mL  3 mL Intravenous Q12H Rise Patience, MD   3 mL at 12/10/14 0956  . torsemide (DEMADEX) tablet 40 mg  40 mg Oral BID Rise Patience, MD   40 mg at 12/10/14 9528  . [START ON December 15, 2014] warfarin (COUMADIN) tablet 1.25 mg  1.25 mg Oral Once per day on Mon Fri Erenest Blank, RPH      . warfarin (  COUMADIN) tablet 2.5 mg  2.5 mg Oral Once per day on Sun Tue Wed Thu Sat Stevphen Rochester, RPH   2.5 mg at 12/09/14 1732  . Warfarin - Pharmacist Dosing Inpatient   Does not apply q1800 Stevphen Rochester, RPH   0  at 12/09/14 1800    Physical Exam: General appearance: alert and no distress Neck: no carotid bruit and no JVD Lungs: diminished breath sounds bilaterally Heart: regular rate and rhythm, S1: normal, S2: decreased intensity and systolic murmur: late systolic 3/6, crescendo at 2nd right intercostal space Abdomen: soft, non-tender; bowel sounds normal; no masses,  no organomegaly Extremities: extremities normal, atraumatic, no cyanosis or edema Pulses: 2+ and symmetric Skin: Skin color, texture, turgor normal. No rashes or lesions Neurologic: Grossly normal Psych: Appropriately tearful  Lab Results: Results for orders placed or performed during the hospital encounter of 12/09/14 (from the past 48 hour(s))    CBG monitoring, ED     Status: Abnormal   Collection Time: 12/09/14 12:39 AM  Result Value Ref Range   Glucose-Capillary 124 (H) 65 - 99 mg/dL  Basic metabolic panel     Status: Abnormal   Collection Time: 12/09/14 12:58 AM  Result Value Ref Range   Sodium 141 135 - 145 mmol/L   Potassium 4.1 3.5 - 5.1 mmol/L   Chloride 98 (L) 101 - 111 mmol/L   CO2 28 22 - 32 mmol/L   Glucose, Bld 121 (H) 65 - 99 mg/dL   BUN 79 (H) 6 - 20 mg/dL   Creatinine, Ser 0.91 (H) 0.61 - 1.24 mg/dL   Calcium 9.1 8.9 - 15.3 mg/dL   GFR calc non Af Amer 14 (L) >60 mL/min   GFR calc Af Amer 16 (L) >60 mL/min    Comment: (NOTE) The eGFR has been calculated using the CKD EPI equation. This calculation has not been validated in all clinical situations. eGFR's persistently <60 mL/min signify possible Chronic Kidney Disease.    Anion gap 15 5 - 15  CBC     Status: Abnormal   Collection Time: 12/09/14 12:58 AM  Result Value Ref Range   WBC 8.3 4.0 - 10.5 K/uL   RBC 3.62 (L) 4.22 - 5.81 MIL/uL   Hemoglobin 11.2 (L) 13.0 - 17.0 g/dL   HCT 51.4 (L) 82.5 - 98.3 %   MCV 98.1 78.0 - 100.0 fL   MCH 30.9 26.0 - 34.0 pg   MCHC 31.5 30.0 - 36.0 g/dL   RDW 78.8 99.6 - 49.8 %   Platelets 120 (L) 150 - 400 K/uL  Protime-INR     Status: Abnormal   Collection Time: 12/09/14 12:58 AM  Result Value Ref Range   Prothrombin Time 23.1 (H) 11.6 - 15.2 seconds   INR 2.07 (H) 0.00 - 1.49  I-stat troponin, ED     Status: Abnormal   Collection Time: 12/09/14  2:53 AM  Result Value Ref Range   Troponin i, poc 0.10 (HH) 0.00 - 0.08 ng/mL   Comment NOTIFIED PHYSICIAN    Comment 3            Comment: Due to the release kinetics of cTnI, a negative result within the first hours of the onset of symptoms does not rule out myocardial infarction with certainty. If myocardial infarction is still suspected, repeat the test at appropriate intervals.   Urinalysis, Routine w reflex microscopic (not at [facility])     Status: None    Collection Time: 12/09/14  3:10 AM  Result Value Ref Range   Color, Urine YELLOW YELLOW   APPearance CLEAR CLEAR   Specific Gravity, Urine 1.008 1.005 - 1.030   pH 6.5 5.0 - 8.0   Glucose, UA NEGATIVE NEGATIVE mg/dL   Hgb urine dipstick NEGATIVE NEGATIVE   Bilirubin Urine NEGATIVE NEGATIVE   Ketones, ur NEGATIVE NEGATIVE mg/dL   Protein, ur NEGATIVE NEGATIVE mg/dL   Urobilinogen, UA 0.2 0.0 - 1.0 mg/dL   Nitrite NEGATIVE NEGATIVE   Leukocytes, UA NEGATIVE NEGATIVE    Comment: MICROSCOPIC NOT DONE ON URINES WITH NEGATIVE PROTEIN, BLOOD, LEUKOCYTES, NITRITE, OR GLUCOSE <1000 mg/dL.  Troponin I (q 6hr x 3)     Status: Abnormal   Collection Time: 12/09/14  6:22 AM  Result Value Ref Range   Troponin I 0.11 (H) <0.031 ng/mL    Comment:        PERSISTENTLY INCREASED TROPONIN VALUES IN THE RANGE OF 0.04-0.49 ng/mL CAN BE SEEN IN:       -UNSTABLE ANGINA       -CONGESTIVE HEART FAILURE       -MYOCARDITIS       -CHEST TRAUMA       -ARRYHTHMIAS       -LATE PRESENTING MYOCARDIAL INFARCTION       -COPD   CLINICAL FOLLOW-UP RECOMMENDED.   Comprehensive metabolic panel     Status: Abnormal   Collection Time: 12/09/14  6:22 AM  Result Value Ref Range   Sodium 140 135 - 145 mmol/L   Potassium 3.7 3.5 - 5.1 mmol/L   Chloride 100 (L) 101 - 111 mmol/L   CO2 28 22 - 32 mmol/L   Glucose, Bld 109 (H) 65 - 99 mg/dL   BUN 75 (H) 6 - 20 mg/dL   Creatinine, Ser 3.68 (H) 0.61 - 1.24 mg/dL   Calcium 9.0 8.9 - 10.3 mg/dL   Total Protein 5.9 (L) 6.5 - 8.1 g/dL   Albumin 3.3 (L) 3.5 - 5.0 g/dL   AST 22 15 - 41 U/L   ALT 17 17 - 63 U/L   Alkaline Phosphatase 46 38 - 126 U/L   Total Bilirubin 1.1 0.3 - 1.2 mg/dL   GFR calc non Af Amer 14 (L) >60 mL/min   GFR calc Af Amer 16 (L) >60 mL/min    Comment: (NOTE) The eGFR has been calculated using the CKD EPI equation. This calculation has not been validated in all clinical situations. eGFR's persistently <60 mL/min signify possible Chronic  Kidney Disease.    Anion gap 12 5 - 15  CBC with Differential/Platelet     Status: Abnormal   Collection Time: 12/09/14  6:22 AM  Result Value Ref Range   WBC 9.2 4.0 - 10.5 K/uL   RBC 3.38 (L) 4.22 - 5.81 MIL/uL   Hemoglobin 10.7 (L) 13.0 - 17.0 g/dL   HCT 33.5 (L) 39.0 - 52.0 %   MCV 99.1 78.0 - 100.0 fL   MCH 31.7 26.0 - 34.0 pg   MCHC 31.9 30.0 - 36.0 g/dL   RDW 15.6 (H) 11.5 - 15.5 %   Platelets 110 (L) 150 - 400 K/uL    Comment: PLATELET COUNT CONFIRMED BY SMEAR   Neutrophils Relative % 83 (H) 43 - 77 %   Lymphocytes Relative 6 (L) 12 - 46 %   Monocytes Relative 8 3 - 12 %   Eosinophils Relative 3 0 - 5 %   Basophils Relative 0 0 - 1 %   Neutro Abs 7.6 1.7 -  7.7 K/uL   Lymphs Abs 0.6 (L) 0.7 - 4.0 K/uL   Monocytes Absolute 0.7 0.1 - 1.0 K/uL   Eosinophils Absolute 0.3 0.0 - 0.7 K/uL   Basophils Absolute 0.0 0.0 - 0.1 K/uL  TSH     Status: None   Collection Time: 12/09/14  6:22 AM  Result Value Ref Range   TSH 3.578 0.350 - 4.500 uIU/mL  Troponin I (q 6hr x 3)     Status: Abnormal   Collection Time: 12/09/14 11:13 AM  Result Value Ref Range   Troponin I 0.09 (H) <0.031 ng/mL    Comment:        PERSISTENTLY INCREASED TROPONIN VALUES IN THE RANGE OF 0.04-0.49 ng/mL CAN BE SEEN IN:       -UNSTABLE ANGINA       -CONGESTIVE HEART FAILURE       -MYOCARDITIS       -CHEST TRAUMA       -ARRYHTHMIAS       -LATE PRESENTING MYOCARDIAL INFARCTION       -COPD   CLINICAL FOLLOW-UP RECOMMENDED.   Brain natriuretic peptide     Status: Abnormal   Collection Time: 12/09/14  4:50 PM  Result Value Ref Range   B Natriuretic Peptide 1103.0 (H) 0.0 - 100.0 pg/mL  Troponin I (q 6hr x 3)     Status: Abnormal   Collection Time: 12/09/14  7:04 PM  Result Value Ref Range   Troponin I 0.11 (H) <0.031 ng/mL    Comment:        PERSISTENTLY INCREASED TROPONIN VALUES IN THE RANGE OF 0.04-0.49 ng/mL CAN BE SEEN IN:       -UNSTABLE ANGINA       -CONGESTIVE HEART FAILURE        -MYOCARDITIS       -CHEST TRAUMA       -ARRYHTHMIAS       -LATE PRESENTING MYOCARDIAL INFARCTION       -COPD   CLINICAL FOLLOW-UP RECOMMENDED.   Protime-INR     Status: Abnormal   Collection Time: 12/10/14  3:10 AM  Result Value Ref Range   Prothrombin Time 22.1 (H) 11.6 - 15.2 seconds   INR 1.94 (H) 0.00 - 8.93  Basic metabolic panel     Status: Abnormal   Collection Time: 12/10/14  3:10 AM  Result Value Ref Range   Sodium 144 135 - 145 mmol/L   Potassium 3.5 3.5 - 5.1 mmol/L   Chloride 100 (L) 101 - 111 mmol/L   CO2 32 22 - 32 mmol/L   Glucose, Bld 95 65 - 99 mg/dL   BUN 72 (H) 6 - 20 mg/dL   Creatinine, Ser 3.39 (H) 0.61 - 1.24 mg/dL   Calcium 9.3 8.9 - 10.3 mg/dL   GFR calc non Af Amer 16 (L) >60 mL/min   GFR calc Af Amer 18 (L) >60 mL/min    Comment: (NOTE) The eGFR has been calculated using the CKD EPI equation. This calculation has not been validated in all clinical situations. eGFR's persistently <60 mL/min signify possible Chronic Kidney Disease.    Anion gap 12 5 - 15  CBC     Status: Abnormal   Collection Time: 12/10/14  3:10 AM  Result Value Ref Range   WBC 7.1 4.0 - 10.5 K/uL   RBC 3.30 (L) 4.22 - 5.81 MIL/uL   Hemoglobin 10.2 (L) 13.0 - 17.0 g/dL   HCT 32.3 (L) 39.0 - 52.0 %   MCV 97.9 78.0 -  100.0 fL   MCH 30.9 26.0 - 34.0 pg   MCHC 31.6 30.0 - 36.0 g/dL   RDW 15.5 11.5 - 15.5 %   Platelets 119 (L) 150 - 400 K/uL    Comment: CONSISTENT WITH PREVIOUS RESULT    Imaging: Dg Chest 2 View  12/09/2014   CLINICAL DATA:  Syncope. Hit face in bathroom. Concern for chest injury. Initial encounter.  EXAM: CHEST  2 VIEW  COMPARISON:  Chest radiograph performed 05/12/2013  FINDINGS: Mild vascular congestion is noted. Patchy bilateral central airspace opacities may reflect mild interstitial edema. No pleural effusion or pneumothorax is seen.  The cardiomediastinal silhouette is mildly enlarged. The patient is status post median sternotomy. No acute osseous  abnormalities are identified. Degenerative change is noted at both glenohumeral joints.  IMPRESSION: Mild vascular congestion and mild cardiomegaly noted. Patchy bilateral central airspace opacities may reflect mild interstitial edema.   Electronically Signed   By: Garald Balding M.D.   On: 12/09/2014 05:16   Ct Head Wo Contrast  12/09/2014   CLINICAL DATA:  Initial evaluation for acute trauma, fall.  EXAM: CT HEAD WITHOUT CONTRAST  TECHNIQUE: Contiguous axial images were obtained from the base of the skull through the vertex without intravenous contrast.  COMPARISON:  None.  FINDINGS: Diffuse prominence of the CSF containing spaces is compatible with generalized cerebral atrophy. Patchy hypodensity within the periventricular and deep white matter both cerebral hemispheres present, most consistent with chronic small vessel ischemic disease. Prominent atheromatous plaque present within the carotid siphons and vertebrobasilar system.  No acute large vessel territory infarct or intracranial hemorrhage. No mass lesion, midline shift, or mass effect. No hydrocephalus. No extra-axial fluid collection para  Prominent left periorbital contusion present. Globes are intact. Sequelae of prior lens extraction noted bilaterally.  Calvarium intact.  Mild layering opacity present within the left sphenoid sinus. Paranasal sinuses are otherwise clear. No mastoid effusion.  IMPRESSION: 1. No acute intracranial process. 2. Left periorbital soft tissue contusion. 3. Generalized cerebral atrophy with chronic microvascular ischemic disease.   Electronically Signed   By: Jeannine Boga M.D.   On: 12/09/2014 02:56    Assessment:  1. Principal Problem: 2.   Syncope 3. Active Problems: 4.   Essential hypertension 5.   Aortic stenosis 6.   S/P CABG x 5 7.   CKD (chronic kidney disease), stage IV 8.   Chronic systolic CHF (congestive heart failure) 9.   Chronic atrial fibrillation 10.   Plan:  1. Spoke further with  Mr. Reinoso about his options, which are limited. He would likely need TAVR, but that would require extensive contrast dye which could likely lead to dialysis and he does not want this. He understands that syncope is a cardinal finding with critical aortic stenosis that predicts a high risk of mortality <1 year after onset. He may be appropriate for palliative measures. He agrees that he does not want resuscitation at this point and that such measures given his cardiomyopathy, advanced renal failure and critical aortic stenosis would not be survivable. To help with symptoms, will discontinue imdur and hydralazine which may be affecting preload.  BP is low normal anyway. Follow-up with Dr. Acie Fredrickson as an outpatient.  Time Spent Directly with Patient:  15 minutes  Length of Stay:   Pixie Casino, MD, [facility] Attending Cardiologist [place] 12/10/2014, 12:41 PM

## 2014-12-10 NOTE — Progress Notes (Signed)
Echocardiogram 2D Echocardiogram with Definity has been performed.  Nolon Rod 12/10/2014, 12:55 PM

## 2014-12-10 NOTE — Progress Notes (Signed)
Physical Therapy Treatment Patient Details Name: Alex Orozco MRN: [id number] DOB: Oct 05, 1929 Today's Date: 12/10/2014    History of Present Illness Alex Orozco is a 81 y.o. male with history of CAD status post CABG status post stenting, chronic atrial fibrillation, chronic kidney disease stage IV, chronic systolic heart failure and severe aortic stenosis was brought to the ER after patient had a fall and syncope. Patient was in the bathroom and returning back patient suddenly lost consciousness and hit his head against the bathroom tub. Patient has a small laceration and periorbital hematoma around the left eye.     PT Comments    Pt pleasant but HOH and willing to progress mobility. Pt reports bil shoulders and left knee all function poorly. Pt educated for RW use, transfers and HEP. Will continue to follow to maximize function. Wife reports she is having increased difficulty with her back and knee from assisting pt.   Follow Up Recommendations  Home health PT     Equipment Recommendations       Recommendations for Other Services       Precautions / Restrictions Precautions Precautions: Fall Restrictions Weight Bearing Restrictions: No    Mobility  Bed Mobility Overal bed mobility: Needs Assistance       Supine to sit: Min assist     General bed mobility comments: right hand held assist to elevate trunk. Pt with bad shoulders and unable to use rail, roll to side or implement different technique per pt and wife assist pulling him up at home  Transfers       Sit to Stand: Min guard         General transfer comment: guarding for balance and supporting RW for standing, cues for sitting - pt unable to reach back for surface due to shoulder limited function  Ambulation/Gait Ambulation/Gait assistance: Supervision Ambulation Distance (Feet): 200 Feet Assistive device: Rolling walker (2 wheeled) Gait Pattern/deviations: Step-through pattern;Decreased stride  length;Trunk flexed   Gait velocity interpretation: Below normal speed for age/gender General Gait Details: cues for position in RW and directional cues, 1/4 dyspnea with sats 98% on RA with gait   Stairs            Wheelchair Mobility    Modified Rankin (Stroke Patients Only)       Balance Overall balance assessment: Needs assistance   Sitting balance-Leahy Scale: Good       Standing balance-Leahy Scale: Poor                      Cognition Arousal/Alertness: Awake/alert Behavior During Therapy: WFL for tasks assessed/performed Overall Cognitive Status: Within Functional Limits for tasks assessed                      Exercises General Exercises - Lower Extremity Long Arc Quad: AROM;Both;Seated;15 reps Hip Flexion/Marching: AROM;Both;Seated;15 reps Toe Raises: AROM;Seated;Both;15 reps    General Comments        Pertinent Vitals/Pain Pain Assessment: No/denies pain  HR 105 sats 98% on RA    Home Living                      Prior Function            PT Goals (current goals can now be found in the care plan section) Progress towards PT goals: Progressing toward goals    Frequency       PT Plan Current plan remains appropriate  Co-evaluation             End of Session Equipment Utilized During Treatment: Gait belt Activity Tolerance: Patient tolerated treatment well Patient left: in chair;with call bell/phone within reach;with family/visitor present;with chair alarm set     Time: 7846-9629 PT Time Calculation (min) (ACUTE ONLY): 20 min  Charges:  $Gait Training: 8-22 mins                    G Codes:      Delorse Lek December 15, 2014, 1:14 PM Delaney Meigs, PT [phone]

## 2014-12-10 NOTE — Progress Notes (Signed)
ANTICOAGULATION CONSULT NOTE - Follow Up Consult  Pharmacy Consult for Warfarin Indication: atrial fibrillation  No Known Allergies  Patient Measurements: Height:  (167.6 cm) Weight: 221 lb 11.2 oz (100.562 kg) IBW/kg (Calculated) : 63.8   Vital Signs: Temp: 97.9 F (36.6 C) (07/21 0447) Temp Source: Oral (07/21 0447) BP: 114/68 mmHg (07/21 0447) Pulse Rate: 67 (07/21 0447)  Labs:  Recent Labs  12/09/14 0058 12/09/14 0622 12/09/14 1113 12/09/14 1904 12/10/14 0310  HGB 11.2* 10.7*  --   --  10.2*  HCT 35.5* 33.5*  --   --  32.3*  PLT 120* 110*  --   --  119*  LABPROT 23.1*  --   --   --  22.1*  INR 2.07*  --   --   --  1.94*  CREATININE 3.67* 3.68*  --   --  3.39*  TROPONINI  --  0.11* 0.09* 0.11*  --     Estimated Creatinine Clearance: 18.7 mL/min (by C-G formula based on Cr of 3.39).   Medications:  Scheduled:  . allopurinol  100 mg Oral Daily  . carvedilol  6.25 mg Oral BID WC  . cholecalciferol  5,000 Units Oral Daily  . doxazosin  4 mg Oral Daily  . famotidine  10 mg Oral q morning - 10a  . ferrous sulfate  325 mg Oral Daily  . hydrALAZINE  15 mg Oral 3 times per day  . hydroxychloroquine  200 mg Oral Daily  . isosorbide mononitrate  30 mg Oral Daily  . omega-3 acid ethyl esters  1 g Oral Daily  . potassium chloride SA  20 mEq Oral BID  . rosuvastatin  10 mg Oral QPM  . sodium chloride  3 mL Intravenous Q12H  . torsemide  40 mg Oral BID  . [START ON 12-17-14] warfarin  1.25 mg Oral Once per day on Mon Fri  . warfarin  2.5 mg Oral Once per day on Sun Tue Wed Thu Sat  . Warfarin - Pharmacist Dosing Inpatient   Does not apply q1800    Assessment: 81 yo male presents with syncopal episode/fall. Patient has permanent atrial fibrillation and severe aortic stenosis on exam. Per cardiology, patient not a candidate for AVR due to renal function.  Patient has dark purple bruising on left side of face/left eye and ecchymosis present on both arms.   INR  1.94 H&H 10.2/32.3, PLT 119    Goal of Therapy:  INR 2-3 Monitor platelets by anticoagulation protocol: Yes   Plan:  Plan: -Continue warfarin per home regime (1.25 mg on Mon/Fri, 2.5 mg all other days) -Daily PT/INR, adjust dosing accordingly -Follow Left periorbital hematoma/ecchymosis present to both arms -Monitor for s/sx bleeding  Elige Ko, Pharm.D., BCPS 12/10/2014,7:45 AM

## 2014-12-10 NOTE — Progress Notes (Signed)
Heart Failure Navigator Consult Note  Presentation: Alex Orozco is a 81 y.o. male with history of CAD status post CABG status post stenting, chronic atrial fibrillation, chronic kidney disease stage IV, chronic systolic heart failure and severe aortic stenosis was brought to the ER after patient had a fall and syncope. Last evening patient was in the bathroom and returning back patient suddenly lost consciousness and hit his head against the bathroom tub. Patient has a small laceration and periorbital hematoma around the left eye. Patient states he does not know what made him fall. Patient's daughter felt that his heart rate was mildly elevated when he fell. Patient may have lost consciousness for 3-4 minutes. Denies any chest pain palpitations nausea vomiting abdominal pain diarrhea fever chills. EKG shows atrial fibrillation and CT head shows periorbital hematoma. Patient has been admitted for further management of syncope.   Past Medical History  Diagnosis Date  . Coronary artery disease     a.  s/p CABG 1990;  b. NSTEMI 9/10:  LHC with S-RCA ok, S-D1 occluded (chronic), S-D3 occluded, L-LAD ok;  PCI:  Endeavour DES to S-D3;  c. 10/2011 Lexiscan MV EF 37%, medium sized fixed defect over basilar to mid lateral wall.  No ishcemia.   . Lung disease, restrictive   . History of PFTs 01/20/2009  . Pleural effusion   . Hypertension   . Dyslipidemia   . Aortic stenosis     a. echo 4/13: mild AS with mean 19 mmHg. b. echo 12/2012: Mild LVH, EF 25-30%, diffuse HK, moderate to severe aortic stenosis (mean gradient 16 mm of mercury), moderate MR, moderate LAE, mild RVE, mild reduced RVSF, moderate RAE, PASP 40  . Anemia   . Difficult intubation   . Arthritis   . Epistaxis     a. 11/2012: adm with severe epistaxis requiring mechanical ventilation for airway protection c/b blood loss anemia  . Mitral regurgitation     a. Mod by echo 12/2012.  . CKD (chronic kidney disease), stage IV     Dr. Lowell Orozco  .  Permanent atrial fibrillation     a. Chronic coumadin  . Sleep apnea   . Chronic systolic CHF (congestive heart failure)     a. Previous EF 40-50%;  b. Echo 08/2011: mild LVH, EF 55-65, grade 3 diast dysfxn. c. EF 25-30% by echo 12/2012.    History   Social History  . Marital Status: Married    Spouse Name: N/A  . Number of Children: N/A  . Years of Education: N/A   Occupational History  . Retired    Social History Main Topics  . Smoking status: Never Smoker   . Smokeless tobacco: Never Used  . Alcohol Use: No  . Drug Use: No  . Sexual Activity: Not Currently   Other Topics Concern  . None   Social History Narrative    ECHO: Study Conclusions--08/10/14  - Left ventricle: Very poor acoustical windows makes assessment of EF and RWMA&'s difficult. Definitiy contrast was performed but this was of poor quality as well. There is at least moderate LV dysfunction. Recommend Cardic MRI to get a more accurate assessment of LVF. The cavity size was normal. There was moderate concentric hypertrophy. Systolic function was severely reduced. The estimated ejection fraction was in the range of 25% to 30%. Images were inadequate for LV wall motion assessment. - Aortic valve: By peak velocity and mean AV gradient the AS is moderate but the calculated AVA is 0.7cm2 consistent with  severe AS. There is moderate LV dsyfunction and therefore the peak velocity and mean gradient may be underestimated. Severe diffuse thickening and calcification. Valve mobility was restricted. There was mild regurgitation. Valve area (VTI): 0.67 cm^2. Valve area (Vmax): 0.61 cm^2. Valve area (Vmean): 0.54 cm^2. - Mitral valve: There was mild regurgitation. - Left atrium: The atrium was severely dilated. - Pulmonary arteries: PA peak pressure: 43 mm Hg (S).  Impressions:  - The right ventricular systolic pressure was increased consistent with moderate pulmonary  hypertension.  BNP    Component Value Date/Time   BNP 1103.0* 12/09/2014 1650    ProBNP    Component Value Date/Time   PROBNP 1246.0* 05/28/2013 1421     Education Assessment and Provision:  Detailed education and instructions provided on heart failure disease management including the following:  Signs and symptoms of Heart Failure When to call the physician Importance of daily weights Low sodium diet Fluid restriction Medication management Anticipated future follow-up appointments  Patient education given on each of the above topics.  Patient acknowledges understanding and acceptance of all instructions.  I spoke briefly with Mr. And Mrs. Alex Orozco regarding his HF.  He is currently admitted for syncope and his wife tells me that he had been doing "well" in regards to his HF.  They try hard to eat low sodium and he weighs daily.  He was taking Demadex at home and he has a Metolazone dose prn if his weight is up or he has "fluid".  They live in [place] and their daughter lives next door.  He follows with Dr. Elease Orozco outpatient.  I have encouraged his wife to call me with any concerns or questions regarding his HF after discharge.    Education Materials:  "Living Better With Heart Failure" Booklet, Daily Weight Tracker Tool    High Risk Criteria for Readmission and/or Poor Patient Outcomes:   EF <30%- Yes 25-30%  2 or more admissions in 6 months- No  Difficult social situation- No lives with wife --beside daughter  Demonstrates medication noncompliance- No   Barriers of Care:  Knowledge and compliance  Discharge Planning:   Plans to return to home with wife in [place] and daughter lives next door.

## 2014-12-10 NOTE — Telephone Encounter (Signed)
Notes indicate that he was discharged today. TCM needs to be done tomorrow

## 2014-12-10 NOTE — Discharge Summary (Signed)
Physician Discharge Summary  Alex Orozco WJX:[id number] DOB: Oct 01, 1929 DOA: 12/09/2014  PCP: Kaleen Mask, MD  Admit date: 12/09/2014 Discharge date: 12/10/2014  Time spent: 35 minutes  Recommendations for Outpatient Follow-up:   1. CKD stage IV: Follow up with PCP in 1 week. Weigh self daily.  2. Systolic CHF: Follow-up on volume status 3. Hypertension: Follow up with PCP in 1 week. Continue torsemide and coreg 6.25 mg PO BID 4. Please follow-up on PT/INR in 1-2 days, he had an INR of 1.94 on 12/10/2014. 5. Prior to discharge she was set up with home health services for RN and physical therapy  Discharge Diagnoses:  Principal Problem:   Syncope Active Problems:   Essential hypertension   Aortic stenosis   S/P CABG x 5   CKD (chronic kidney disease), stage IV   Chronic systolic CHF (congestive heart failure)   Chronic atrial fibrillation   Critical aortic valve stenosis   Cardiac syncope   Discharge Condition: stable  Diet recommendation: heart healthy  Filed Weights   12/09/14 0101 12/09/14 0459 12/10/14 0447  Weight: 99.791 kg (220 lb) 99.519 kg (219 lb 6.4 oz) 100.562 kg (221 lb 11.2 oz)    History of present illness:  Per admission H&P, Alex Orozco is a 81 y.o. male with history of CAD status post CABG status post stenting, chronic atrial fibrillation, chronic kidney disease stage IV, chronic systolic heart failure and severe aortic stenosis was brought to the ER after patient had a fall and syncope. Last evening patient was in the bathroom and returning back patient suddenly lost consciousness and hit his head against the bathroom tub. Patient has a small laceration and periorbital hematoma around the left eye. Patient states he does not know what made him fall. Patient's daughter felt that his heart rate was mildly elevated when he fell. Patient may have lost consciousness for 3-4 minutes. Denies any chest pain palpitations nausea vomiting abdominal pain  diarrhea fever chills. EKG shows atrial fibrillation and CT head shows periorbital hematoma. Patient has been admitted for further management of syncope.  Hospital Course:  Syncope - - Patient has multiple medical conditions to include severe aortic stenosis and chronic systolic heart failure having ejection fraction of 25% as of march 2016, along with the possibility of cardiac arrhythmias that could have led to syncopal episode.  - TTE was completed on day of discharge showing EF 25-30% (stable from previous echo), no wall motion abnormalities, severe aortic valve stenosis, mild mitral regurgitation, left atrial dilation, and mildly reduced right ventricle.  - Cardiology consulted and felt patient was not a good candidate for aortic valve replacement due reduced kidney function. Cardiology recommended discontinuing imdur and hydralazine to minimize symptoms.  - PT recommends physical therapy 3 x week and use of rolling walker  - On day of discharge patient denies dizziness, headache, changes in vision. Wife denies changes in personality.   CAD status post CABG and stenting  - Tropinins stable at 0.1, 0.11, and 0.9  - TTE showing EF 25-30% (stable from previous echo), no wall motion abnormalities, severe aortic valve stenosis, mild mitral regurgitation, left atrial dilation, and mildly reduced right ventricle.  - On day of discharge patient denied chest pain  Chronic kidney disease stage IV - - For the past few years, serum creatinine has been insidiously increasing, making it difficult to ascertain baseline creatinine. With this slow progression upward, the elevated serum creatinine may be a reflection of progressive renal dysfunction.  - Serum  creatinine on day of discharge was 3.39, which is down from 3.58 on 12/09/14 - Continue torsemide.   Chronic systolic heart failure  - Last echo was March 2016 with EF 25-30%; TTE showing EF 25-30% (stable from previous echo), no wall motion  abnormalities, severe aortic valve stenosis, mild mitral regurgitation, left atrial dilation, and mildly reduced right ventricle.  - Since admission, weight has remained stable at fluctuating between 219-221; net I/O +175ml - Patient has 1-2+ pitting edema in lower extremities. Patient has shortness of breath, but states this is unchanged. Denies chest pain -  Continue carvedilol, crestor, torsemide. Eat heart healthy diet an weigh self daily.   Severe aortic stenosis  -Patient with history of severe aortic stenosis, last transthoracic echocardiogram performed on 08/10/2014 that showed a calculated uric valve area of 0.7 cm that would be consistent with severe aortic stenosis. Echo also revealed an ejection fraction of 25-30%. He presents with syncope as it is conceivable that underlying aortic stenosis and CHF may have led to his syncopal event. - TTE on day of discharge showing significant aortic valve stenosis with AV area 0.52cm2.  - Cardiology was consulted regarding their opinion on further management of severe aortic stenosis. He was not felt to be be a good candidate for aortic valve replacement. Transaortic valve replacement could be a consideration however workup will require cardiac catheterization which could be an issue with advanced kidney disease.  -Cardiology recommending a follow-up with his primary cardiologist for further discussions on this matter  Hypertension  - BPs have remained stable in 110s/60-70s - Continue torsemide  Chronic atrial fibrillation  - On day of discharge INR 1.94 - Continue coumadin  Left periorbital hematoma  - On day of discharge patient denies headache, dizziness, weakness, or changes in vision.  - Since on coumadin therapy, closely monitor hematoma and laceration for progression.  Procedures:  TTE  CT head  Consultations:  Cardiology  Discharge Exam: Filed Vitals:   12/10/14 0952  BP: 113/66  Pulse:   Temp:   Resp:      General: Alex Orozco is an 81yo gentleman who is resting in bed, alert and oriented, in good spirits, and in no acute distress.  Cardiovascular: irregularly irregular, 3/6 systolic ejection murmur, 2+ brachial pulses, 1-2+ pitting edema BLLE Respiratory: Clear to auscultation bilaterally. No crackles, rhonchi, or wheezes heard.   Discharge Instructions   Discharge Instructions    (HEART FAILURE PATIENTS) Call MD:  Anytime you have any of the following symptoms: 1) 3 pound weight gain in 24 hours or 5 pounds in 1 week 2) shortness of breath, with or without a dry hacking cough 3) swelling in the hands, feet or stomach 4) if you have to sleep on extra pillows at night in order to breathe.    Complete by:  As directed      Call MD for:  difficulty breathing, headache or visual disturbances    Complete by:  As directed      Call MD for:  extreme fatigue    Complete by:  As directed      Call MD for:  hives    Complete by:  As directed      Call MD for:  persistant dizziness or light-headedness    Complete by:  As directed      Call MD for:  persistant nausea and vomiting    Complete by:  As directed      Call MD for:  redness, tenderness, or signs  of infection (pain, swelling, redness, odor or green/yellow discharge around incision site)    Complete by:  As directed      Call MD for:  severe uncontrolled pain    Complete by:  As directed      Call MD for:  temperature >100.4    Complete by:  As directed      Call MD for:    Complete by:  As directed      Diet - low sodium heart healthy    Complete by:  As directed      Increase activity slowly    Complete by:  As directed           Current Discharge Medication List    CONTINUE these medications which have NOT CHANGED   Details  allopurinol (ZYLOPRIM) 100 MG tablet Take 100 mg by mouth. Every other day    carvedilol (COREG) 6.25 MG tablet Take 1 tablet (6.25 mg total) by mouth 2 (two) times daily. Qty: 180 tablet, Refills: 3     Cholecalciferol (VITAMIN D-3) 5000 UNITS TABS Take 1 tablet by mouth daily.     CRESTOR 10 MG tablet TAKE 1 TABLET BY MOUTH EVERY EVENING Qty: 30 tablet, Refills: 6    doxazosin (CARDURA) 4 MG tablet Take 1 tablet (4 mg total) by mouth daily. Qty: 90 tablet, Refills: 3    famotidine (PEPCID) 10 MG tablet Take 10 mg by mouth every morning. For acid    Ferrous Gluconate (IRON) 240 (27 FE) MG TABS Take 1 tablet by mouth daily.    fish oil-omega-3 fatty acids 1000 MG capsule Take 1 g by mouth daily.     hydroxychloroquine (PLAQUENIL) 200 MG tablet Take 200 mg by mouth daily.     metolazone (ZAROXOLYN) 2.5 MG tablet 1 tablet today and then only as directed Qty: 30 tablet, Refills: 0    potassium chloride SA (K-DUR,KLOR-CON) 20 MEQ tablet TAKE ONE TABLET BY MOUTH TWICE DAILY Qty: 180 tablet, Refills: 3    torsemide (DEMADEX) 20 MG tablet TAKE 2 TABLETS BY MOUTH TWICE DAILY Qty: 120 tablet, Refills: 6    warfarin (COUMADIN) 2.5 MG tablet Take 1 tablet (2.5 mg total) by mouth daily. Qty: 90 tablet, Refills: 1    levalbuterol (XOPENEX) 0.63 MG/3ML nebulizer solution Take 3 mLs (0.63 mg total) by nebulization every 4 (four) hours as needed for wheezing. Qty: 3 mL, Refills: 12      STOP taking these medications     hydrALAZINE (APRESOLINE) 10 MG tablet      isosorbide mononitrate (IMDUR) 30 MG 24 hr tablet        No Known Allergies Follow-up Information    Follow up with Kaleen Mask, MD On 12/17/2014.   Specialty:  Family Medicine   Why:  Appt. @ 12;00pm   Contact information:   [address] [place] [address] [phone]       Follow up with Nahser, Deloris Ping, MD On 12/17/2014.   Specialty:  Cardiology   Why:  Appt. @ 9;30am with Lucile Crater   Contact information:   [address] [place] [address] [phone]        The results of significant diagnostics from this hospitalization (including imaging, microbiology, ancillary  and laboratory) are listed below for reference.    Significant Diagnostic Studies: Dg Chest 2 View  12/09/2014   CLINICAL DATA:  Syncope. Hit face in bathroom. Concern for chest injury. Initial encounter.  EXAM: CHEST  2 VIEW  COMPARISON:  Chest radiograph performed 05/12/2013  FINDINGS: Mild vascular congestion is noted. Patchy bilateral central airspace opacities may reflect mild interstitial edema. No pleural effusion or pneumothorax is seen.  The cardiomediastinal silhouette is mildly enlarged. The patient is status post median sternotomy. No acute osseous abnormalities are identified. Degenerative change is noted at both glenohumeral joints.  IMPRESSION: Mild vascular congestion and mild cardiomegaly noted. Patchy bilateral central airspace opacities may reflect mild interstitial edema.   Electronically Signed   By: Roanna Raider M.D.   On: 12/09/2014 05:16   Ct Head Wo Contrast  12/09/2014   CLINICAL DATA:  Initial evaluation for acute trauma, fall.  EXAM: CT HEAD WITHOUT CONTRAST  TECHNIQUE: Contiguous axial images were obtained from the base of the skull through the vertex without intravenous contrast.  COMPARISON:  None.  FINDINGS: Diffuse prominence of the CSF containing spaces is compatible with generalized cerebral atrophy. Patchy hypodensity within the periventricular and deep white matter both cerebral hemispheres present, most consistent with chronic small vessel ischemic disease. Prominent atheromatous plaque present within the carotid siphons and vertebrobasilar system.  No acute large vessel territory infarct or intracranial hemorrhage. No mass lesion, midline shift, or mass effect. No hydrocephalus. No extra-axial fluid collection para  Prominent left periorbital contusion present. Globes are intact. Sequelae of prior lens extraction noted bilaterally.  Calvarium intact.  Mild layering opacity present within the left sphenoid sinus. Paranasal sinuses are otherwise clear. No mastoid  effusion.  IMPRESSION: 1. No acute intracranial process. 2. Left periorbital soft tissue contusion. 3. Generalized cerebral atrophy with chronic microvascular ischemic disease.   Electronically Signed   By: Rise Mu M.D.   On: 12/09/2014 02:56    Microbiology: No results found for this or any previous visit (from the past 240 hour(s)).   Labs: Basic Metabolic Panel:  Recent Labs Lab 12/09/14 0058 12/09/14 0622 12/10/14 0310  NA 141 140 144  K 4.1 3.7 3.5  CL 98* 100* 100*  CO2 28 28 32  GLUCOSE 121* 109* 95  BUN 79* 75* 72*  CREATININE 3.67* 3.68* 3.39*  CALCIUM 9.1 9.0 9.3   Liver Function Tests:  Recent Labs Lab 12/09/14 0622  AST 22  ALT 17  ALKPHOS 46  BILITOT 1.1  PROT 5.9*  ALBUMIN 3.3*   No results for input(s): LIPASE, AMYLASE in the last 168 hours. No results for input(s): AMMONIA in the last 168 hours. CBC:  Recent Labs Lab 12/09/14 0058 12/09/14 0622 12/10/14 0310  WBC 8.3 9.2 7.1  NEUTROABS  --  7.6  --   HGB 11.2* 10.7* 10.2*  HCT 35.5* 33.5* 32.3*  MCV 98.1 99.1 97.9  PLT 120* 110* 119*   Cardiac Enzymes:  Recent Labs Lab 12/09/14 0622 12/09/14 1113 12/09/14 1904  TROPONINI 0.11* 0.09* 0.11*   BNP: BNP (last 3 results)  Recent Labs  12/09/14 1650  BNP 1103.0*    ProBNP (last 3 results) No results for input(s): PROBNP in the last 8760 hours.  CBG:  Recent Labs Lab 12/09/14 0039  GLUCAP 124*     Signed:  Deborha Payment, PA-Student   [facility] 12/10/2014, 1:02 PM  Addendum  I personally evaluated patient on 12/10/2014 and agree with the above findings. In summary he is a pleasant 81 year old gentleman with a past medical history of coronary artery disease, stage IV chronic kidney disease, severe aortic stenosis, chronic systolic congestive heart failure who was admitted to the medicine service on 12/09/2014 after having a  syncopal event at home. Previous transthoracic echocardiogram had shown  severe aortic stenosis having AVA of 0.7cm  I suspected that syncope was related to severe aortic stenosis in setting of congestive heart failure. Cardiology was consulted regarding input on further management of heart issues. He was not felt to be a good candidate for aortic valve replacement and although transurethral valve replacement could be considered this would require cardiac catheterization which could possibly worsening renal function. Patient expressed desire to follow-up with his primary cardiologist. During this hospitalization hydralazine and Imdur were discontinued as these medications would likely be beneficial with severe aortic stenosis. Physical examination on day of discharge was unchanged, having 3-6 systolic ejection murmur with radiation to carotids. Also having left periorbital hematoma. I discussed goals of care during this hospitalization as he did not want to undergo aggressive or heroic measures in the event of cardiac arrest. He was made a DO NOT RESUSCITATE. Patient was discharged in stable condition to his home on 12/10/2014. Home health services were set up.

## 2014-12-10 NOTE — Telephone Encounter (Signed)
New Message       TCM f/u in 1 wk eph per Arna Medici at [facility] calling, appt scheduled for 12/17/14 at 9:30 w/ Dayna Dunn.

## 2014-12-14 ENCOUNTER — Telehealth: Payer: Self-pay | Admitting: Cardiovascular Disease

## 2014-12-14 NOTE — Telephone Encounter (Signed)
[facility] [address] [phone]) aware d/c ready for pick up.

## 2014-12-14 NOTE — Telephone Encounter (Signed)
Original d/c received from [facility] Skye [phone]) gave to Dr.Nahser for signing.

## 2014-12-14 NOTE — Telephone Encounter (Signed)
D/C picked up.  °

## 2014-12-15 ENCOUNTER — Ambulatory Visit: Payer: Self-pay | Admitting: Cardiology

## 2014-12-15 DIAGNOSIS — Z5181 Encounter for therapeutic drug level monitoring: Secondary | ICD-10-CM

## 2014-12-15 DIAGNOSIS — I4891 Unspecified atrial fibrillation: Secondary | ICD-10-CM

## 2014-12-17 ENCOUNTER — Encounter: Payer: Medicare Other | Admitting: Physician Assistant

## 2014-12-21 NOTE — Telephone Encounter (Signed)
Called wife and she had said he passed at 130am today

## 2014-12-21 DEATH — deceased

## 2015-03-21 IMAGING — XA IR CARO CERE HEAD/NECK UNILAT RIGHT (MS)
1 series · 11 of 24 positions shown · IV contrast (IODINE)
Comparison: None.

CLINICAL DATA: Intractable epistaxis from the right nasal passage.

RIGHT COMMON CAROTID ARTERY, AND RIGHT EXTERNAL CAROTID ARTERY
ARTERIOGRAMS FOLLOWED BY SUPERSELECTIVE EMBOLIZATION OF THE RIGHT
INTERNAL MAXILLARY ARTERY, AND THE RIGHT INFRAORBITAL ARTERY USING
PVA PARTICLES OF SIZES 250-350, 350-500, AND 500-700 MICRONS

[Series 300: neuro · 11 of 385 slices shown]
[im 17/385]
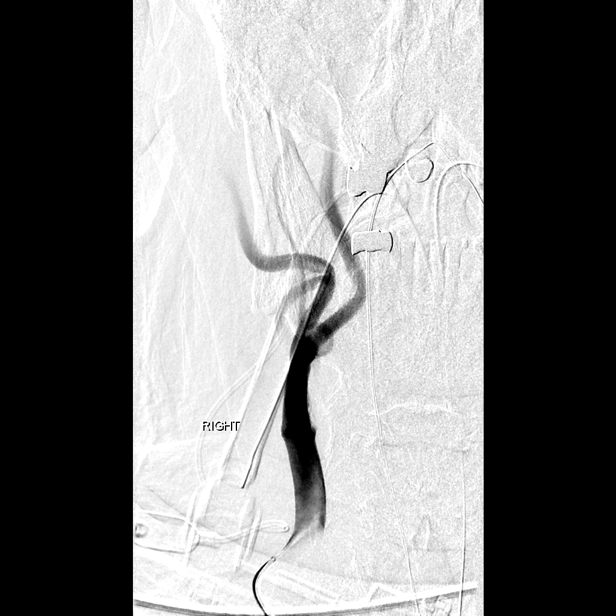
[im 51/385]
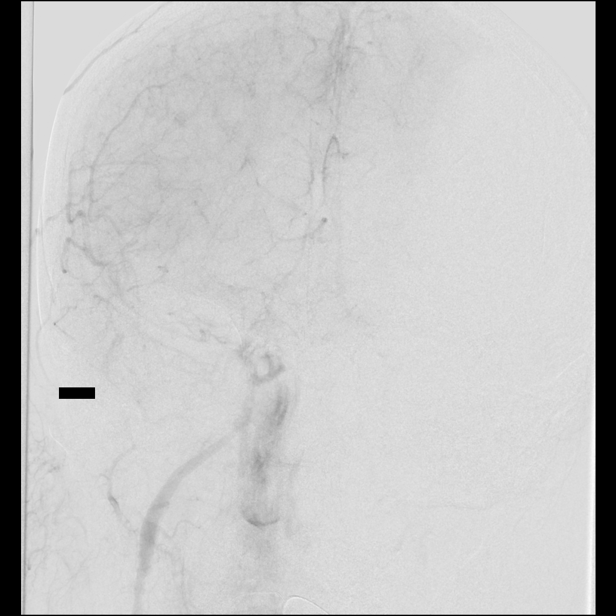
[im 84/385]
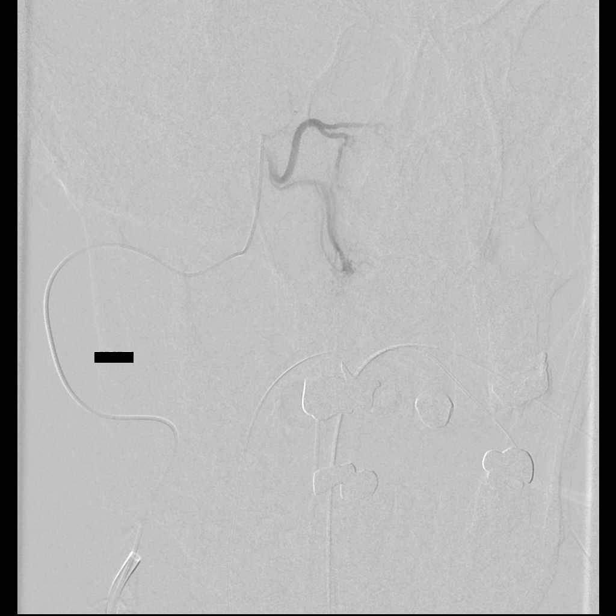
[im 117/385]
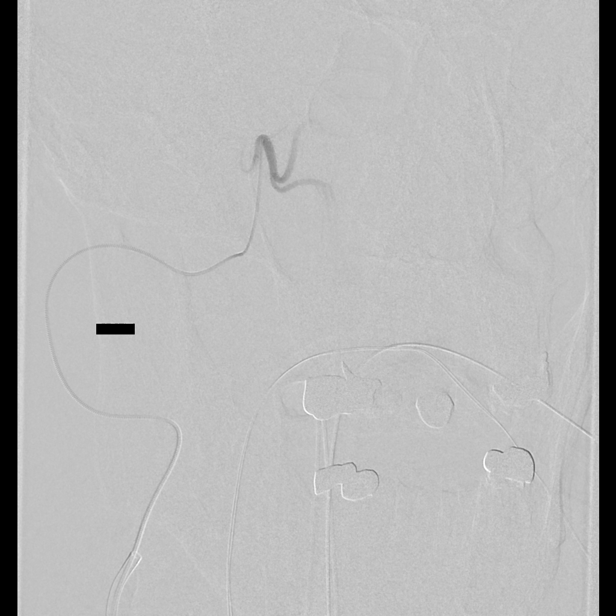
[im 151/385]
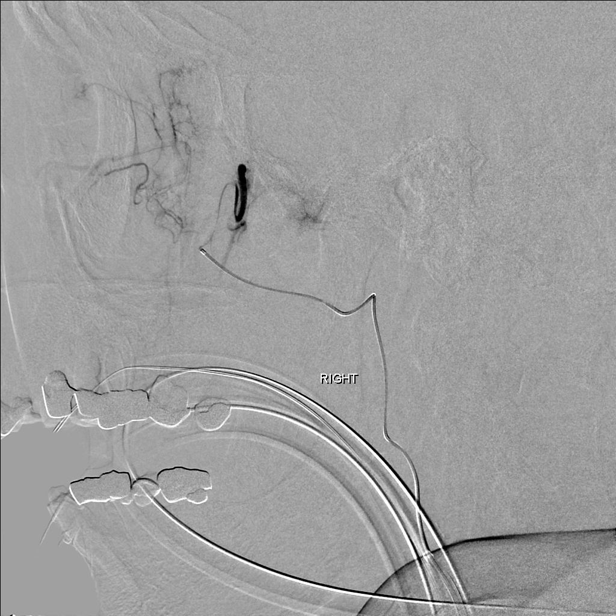
[im 201/385]
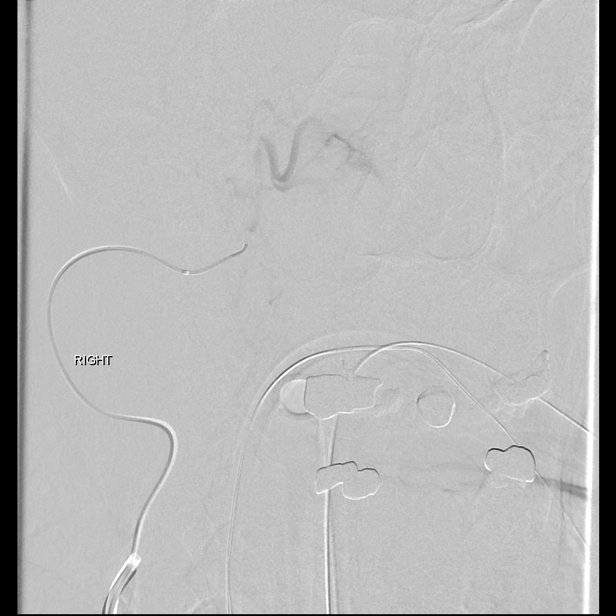
[im 234/385]
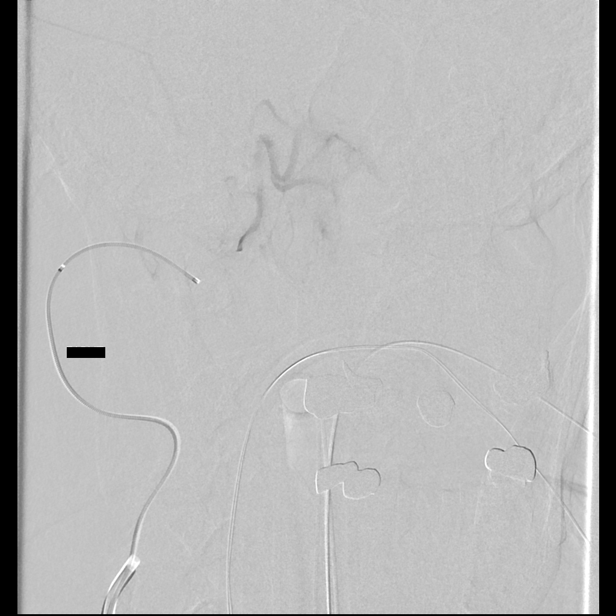
[im 268/385]
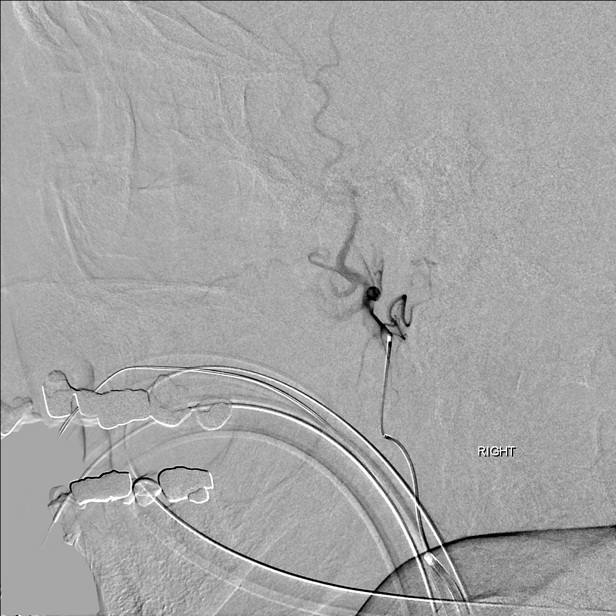
[im 301/385]
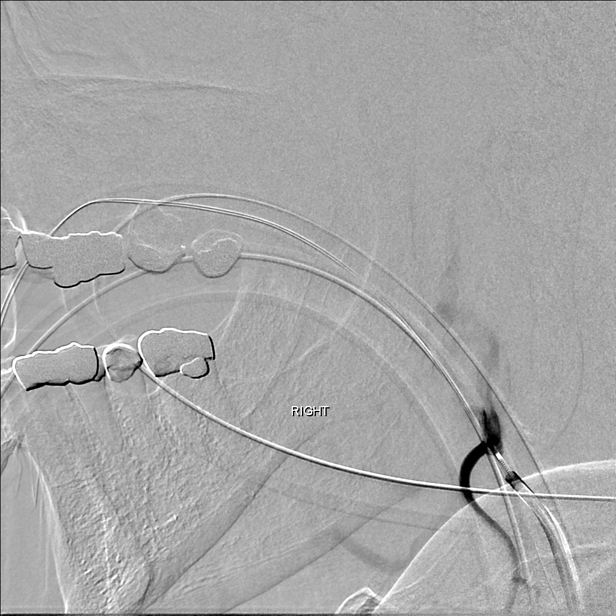
[im 334/385]
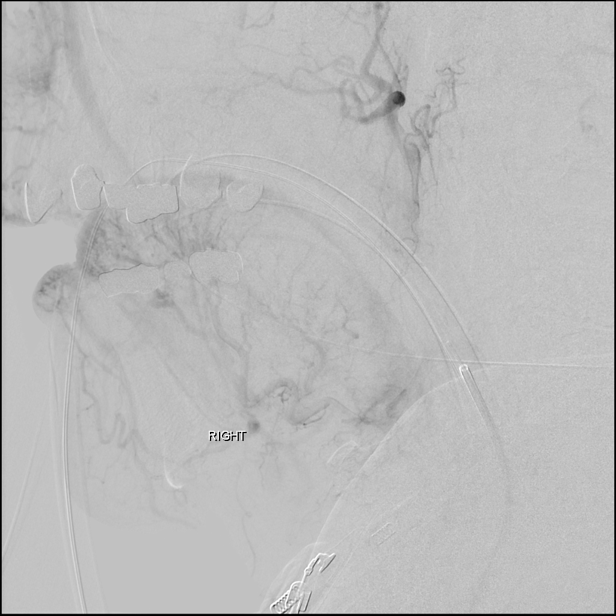
[im 368/385]
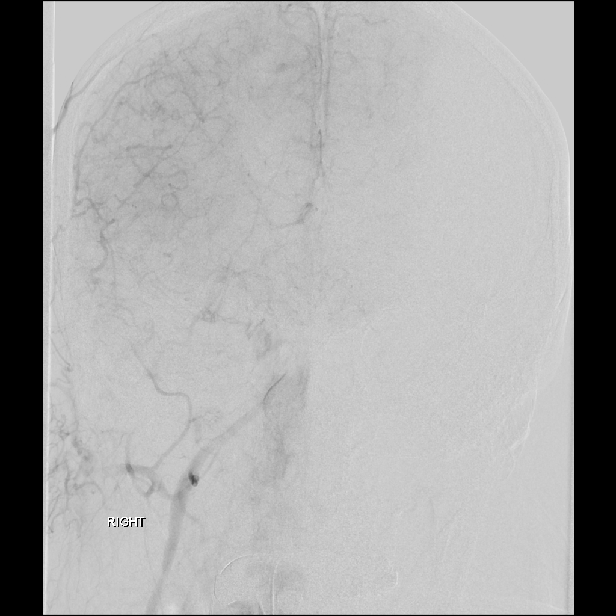

[11 of 24 positions shown; findings below may reference images not displayed]

Following a full explanation of the procedure along with the
potential associated complications, an informed witnessed consent
was obtained.

The patient was put under general anesthesia by the [REDACTED] at [HOSPITAL].

The right groin was prepped and draped in the usual sterile
fashion.  Thereafter using modified Seldinger technique,
transfemoral access into the right common femoral artery was
obtained without difficulty.  Over a 0.035" guidewire, a 5-French
Pinnacle sheath was inserted.

Through this, and also over a 0.035" guidewire, a 5-French JB1
catheter was advanced to the aortic arch region and selectively
positioned in the right common carotid artery.

An arteriogram was then perfor[REDACTED]ed over the right carotid
bifurcation, and of the right external carotid artery, and the
right-sided intracranial circulation.

There were no acute complications.  The patient tolerated the
procedure well.

Medications as per general anesthesia.

Contrast utilized: [id number] approximately 30 ml.
FINDINGS: The right common carotid arteriogram demonstrates the
right external carotid artery in its proximal branches to be
normal.

Abnormal prominence with contrast blush is seen in the territory of
the internal maxillary artery and the infraorbital artery.

More proximal contrast blush is seen along the posterior aspect of
the roof of the mouth at the posterior aspect of the hard palate.

The ascending pharyngeal artery, the facial artery, and the
hypoglossal artery demonstrate normal opacification.

The superior thyroidal artery also appears normal.

Endovascular superselective embolization of distal right internal
maxillary artery branches and of the infraorbital artery using PVA
particles of sizes 250-350, and 350-500 and 500-700 microns.

The diagnostic JB1 catheter in the right common carotid artery was
exchanged over a 0.035" 300 cm Rosen exchange guidewire for a 6-
French 65 cm neurovascular sheath using biplane roadmap technique
and constant fluoroscopic guidance.

Good aspiration was obtained from the side port of the
neurovascular sheath.

A gentle contrast injection demonstrated no evidence of spasm,
dissections or of intraluminal filling defects.

This was then connected to continuous heparinized saline infusion.

Over the Rosen exchange guidewire, a 6-French 90 cm MDP Brite Tip
Envoy guide catheter was then advanced and positioned just proximal
to the right external carotid artery.

The guidewire was removed.  Good aspiration was obtained from the
hub of the 6-French guide catheter.

A gentle control arteriogram demonstrates the origin of the right
external carotid artery and of the right internal carotid artery to
be widely patent.

Over a 0.035" Roadrunner guidewire, using biplane roadmap technique
and constant fluoroscopic guidance, the 6-French guide catheter was
then advanced into the proximal right external carotid artery.  The
guidewire was removed.  Good aspiration was obtained from the hub
of the 6-French guide catheter.  Again a gentle contrast injection
demonstrated no evidence of spasm, dissections or of intraluminal
filling defects.

At this time in a coaxial manner and with constant heparinized
saline infusion using biplane roadmap technique and constant
fluoroscopic guidance, a [employer] two-tip microcatheter which
had been steam-shaped was then advanced over a 0.014" Softip
Synchro microguidewire to the distal end of the 6-French guide
catheter.

With the microguidewire leading with a J-tip configuration to avoid
dissections or inducing spasm, the combination was navigated
without difficulty to the distal right internal maxillary artery
and positioned distal to the infraorbital artery.

The guidewire was removed.  Good aspiration was obtained from the
hub of the microcatheter.

A gentle contrast injection demonstrated brisk antegrade flow
without evidence of dangerous communications with the internal
carotid artery or of the vertebral artery systems.

At this time in a coaxial manner and with constant heparinized
saline infusion using biplane roadmap technique and constant
fluoroscopic guidance, PVA particles of sizes 250-350, 350-500 and
500-700 microns mixed in separate vials in 75% contrast and 25%
heparinized saline infusion were then embolized under constant
fluoroscopic guidance until there was reflux at the tip of the
microcatheter.

Control arteriograms were then performed via the microcatheter to
ensure continued obliteration of flow in the embolized vessels, and
also to evaluate the possibility of opening up of dangerous
anastomoses.

The embolizations were continued using these particles until there
was complete stasis in the internal maxillary artery proximal to
the transverse facial artery branch.

Control arteriogram performed through the 6-French Brite Tip guide
catheter demonstrated complete obliteration of flow in the
embolized vessels with no significant hypervascularity seen in the
region of the nasal septum or the maxillary antrum on the right.

The microcatheter was then retrieved and removed.

The 6-French Brite Tip Envoy guide catheter was then advanced into
the right common carotid artery.

A control arteriogram performed through this guide catheter and
centered intracranially again demonstrated no evidence of
intraluminal filling defects in the anterior circulation
intracranially.

During the procedure there were no acute complications.  The
patient's ACT was maintained in the region of approximately 170
seconds.  The 6-French Brite Tip Envoy guide catheter and the 6-
French neurovascular sheath were then retrieved into the abdominal
aorta and exchanged over a J-tip guidewire for a 7-French Pinnacle
sheath.  This was then connected to continuous heparinized saline
infusion.

The patient was then transported to the medical intensive care unit
for further observations.
IMPRESSION: 1.  Status post endovascular superselective embolization of
abnormal right internal maxillary artery branches, and of the
inferolateral artery proximal to the origin of the transverse
facial artery, using PVA particle sizes 250-350 microns, 350-500
microns and PVA particles of 500-700 microns as described above.

## 2015-07-05 ENCOUNTER — Ambulatory Visit: Payer: Medicare Other | Admitting: Pulmonary Disease

## 2015-08-18 IMAGING — CR DG CHEST 2V
2 series · 2 of 2 positions shown · non-contrast
Comparison: Chest x-ray of January 16, 2013.

CLINICAL DATA: Two-month history of dyspnea, history of bronchitis
and hypertension

EXAM:
CHEST  2 VIEW

[view not recorded (1 of 2)]
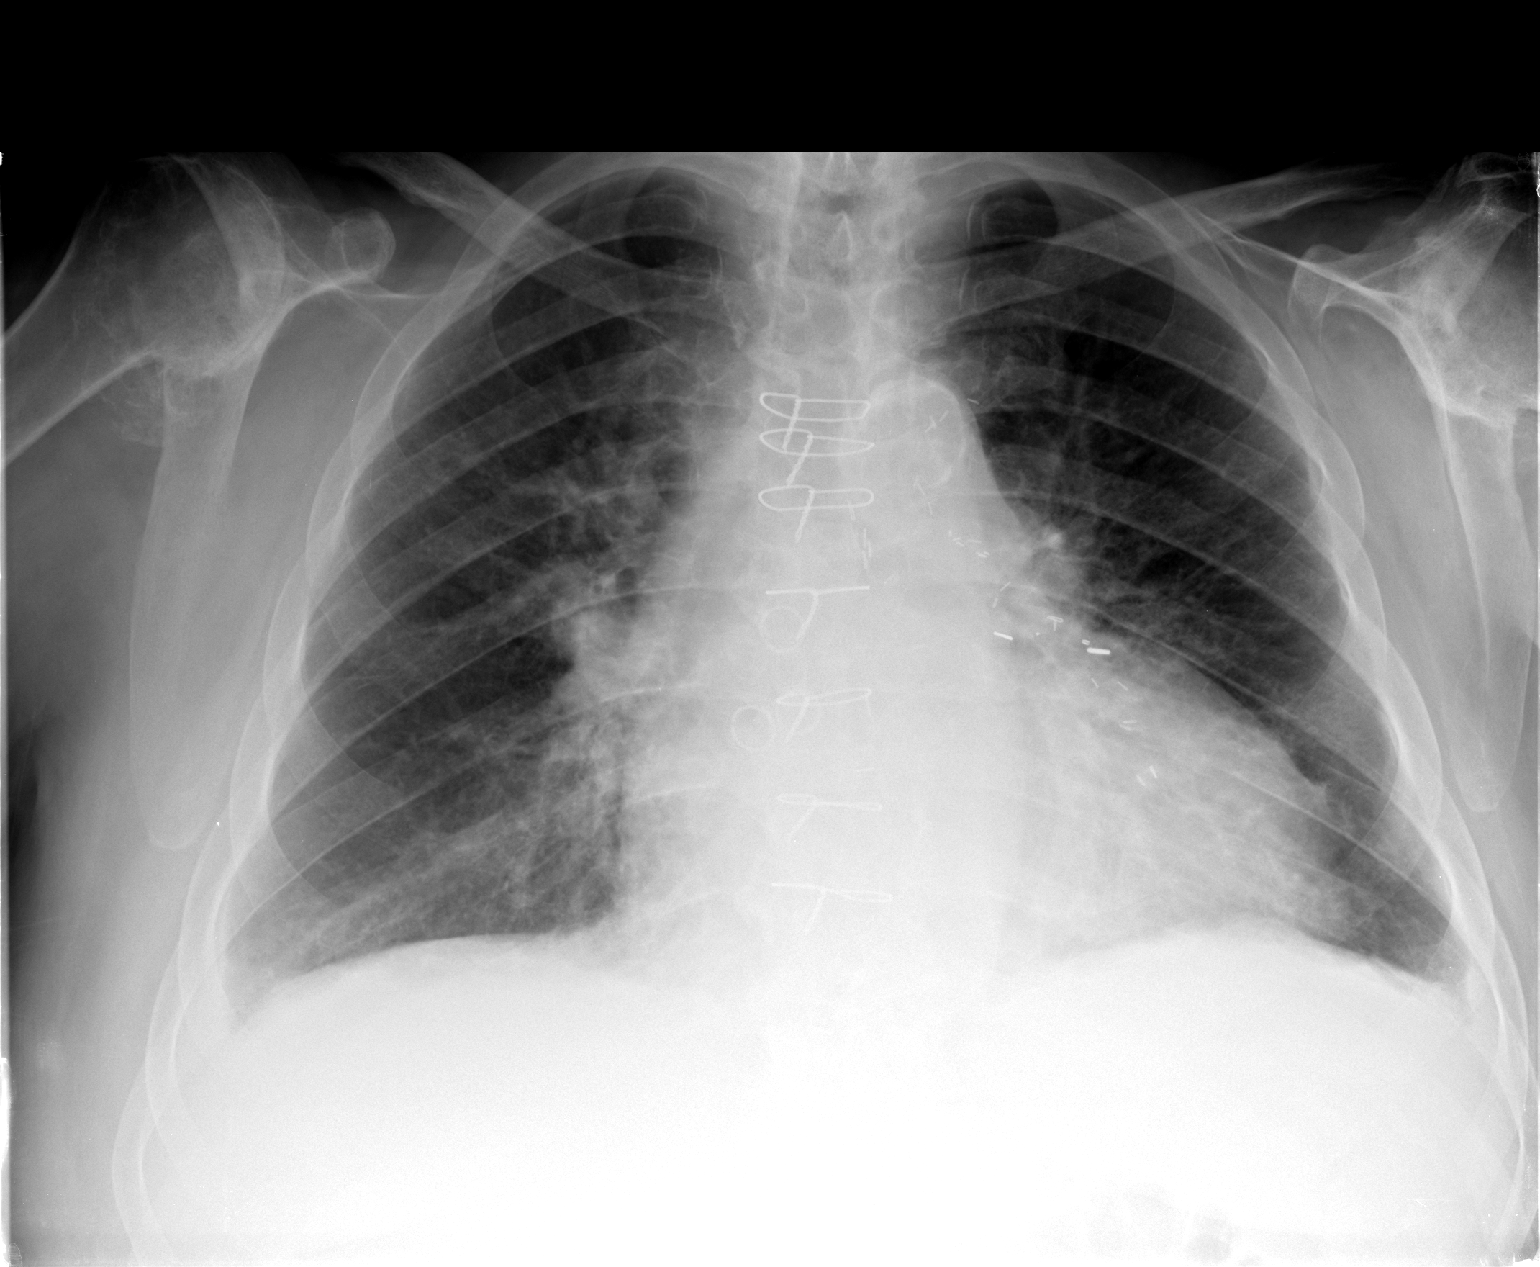

[view not recorded (2 of 2)]
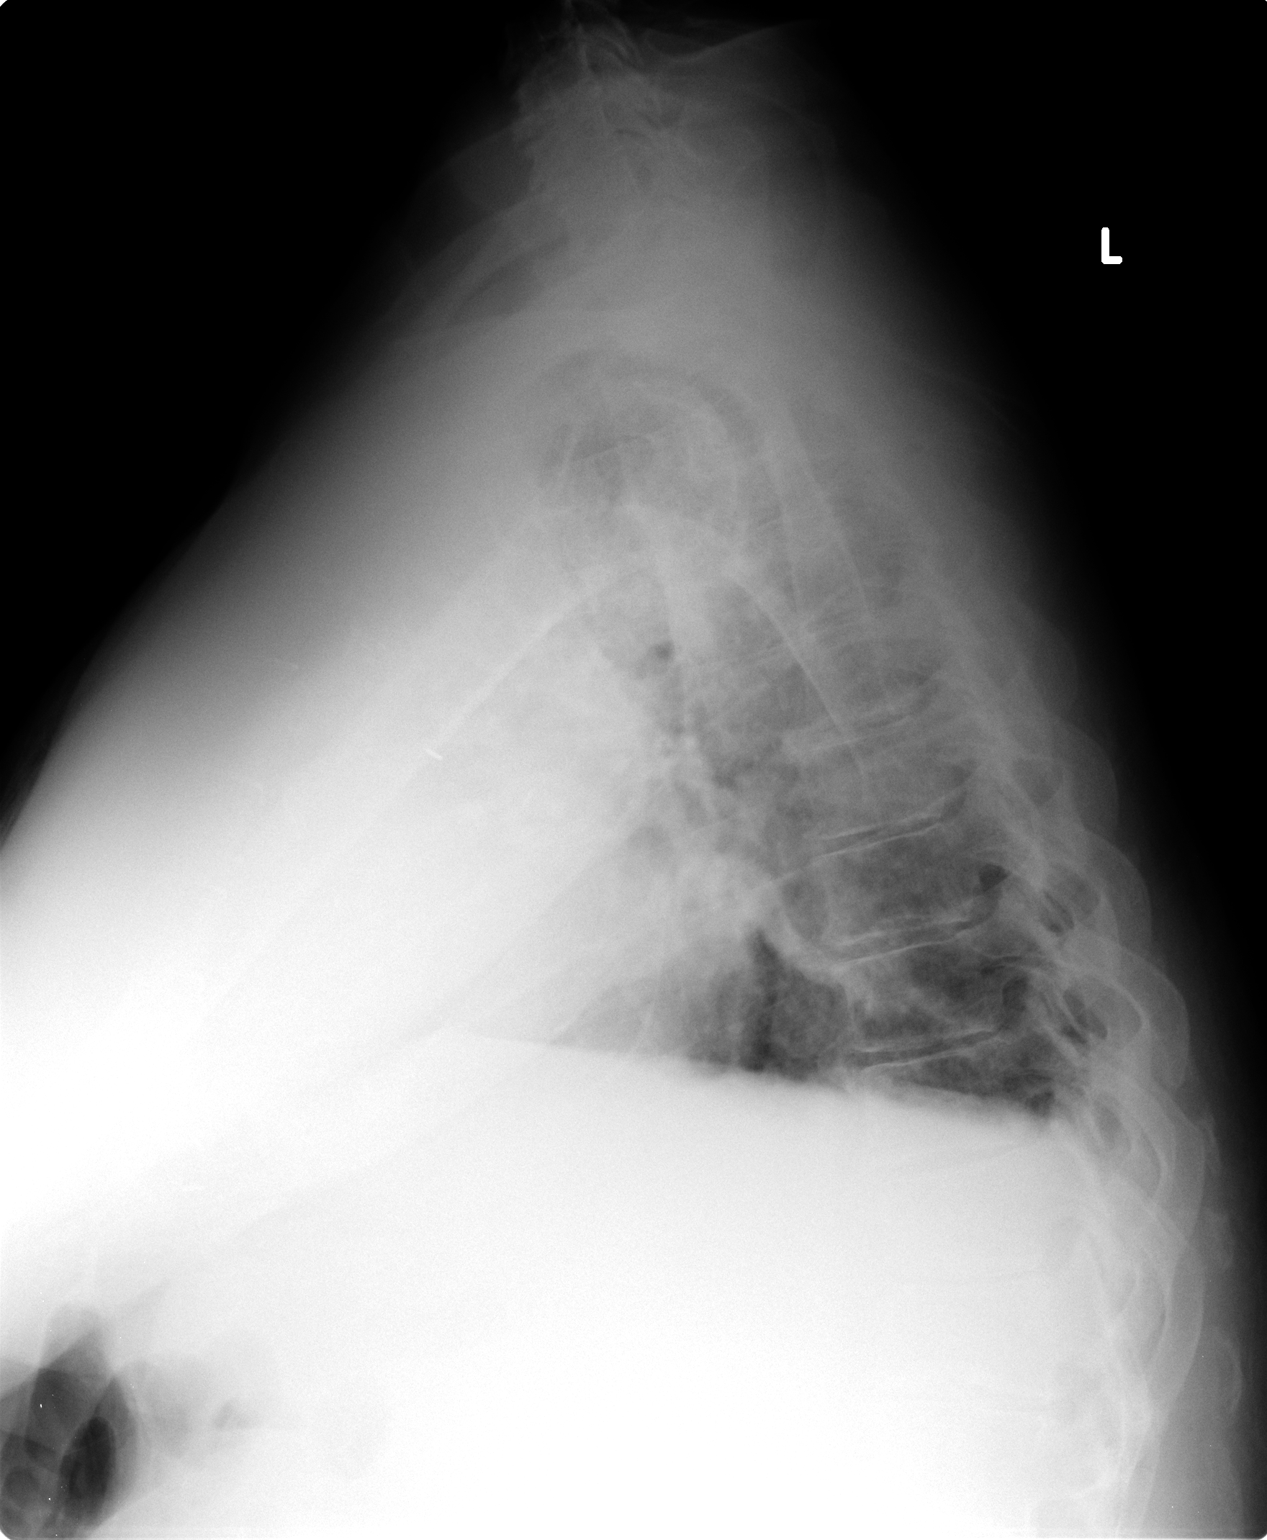

[2 of 2 positions shown; findings below may reference images not displayed]

FINDINGS: The cardiopericardial silhouette remains enlarged. The central
pulmonary vascularity is prominent but more peripherally the
interstitial markings appear normal. Tiny bilateral pleural
effusions blunting the costophrenic angles are suspected. The lungs
are well-expanded. There is no alveolar infiltrate. There is mild
tortuosity of the descending thoracic aorta. The patient has
undergone previous CABG. There are degenerative changes of both
shoulders greater on the left than on the right.
IMPRESSION: 1. There is stable enlargement of the cardiac silhouette and central
pulmonary vascular prominence consistent with compensated CHF. No
cephalization of the vascular pattern is seen today.
2. There is no evidence of pneumonia. There may be tiny pleural
effusions blunting the costophrenic angles bilaterally.
